# Patient Record
Sex: Female | Born: 1955 | Race: White | Hispanic: No | Smoking: Former smoker
Health system: Southern US, Community
[De-identification: ages and names within clinical notes are randomized; demographics above are authoritative.]

## PROBLEM LIST (undated history)

## (undated) DIAGNOSIS — J189 Pneumonia, unspecified organism: Secondary | ICD-10-CM

## (undated) DIAGNOSIS — T8859XA Other complications of anesthesia, initial encounter: Secondary | ICD-10-CM

## (undated) DIAGNOSIS — R112 Nausea with vomiting, unspecified: Secondary | ICD-10-CM

## (undated) DIAGNOSIS — I10 Essential (primary) hypertension: Secondary | ICD-10-CM

## (undated) DIAGNOSIS — F329 Major depressive disorder, single episode, unspecified: Secondary | ICD-10-CM

## (undated) DIAGNOSIS — F419 Anxiety disorder, unspecified: Secondary | ICD-10-CM

## (undated) DIAGNOSIS — F32A Depression, unspecified: Secondary | ICD-10-CM

## (undated) DIAGNOSIS — K219 Gastro-esophageal reflux disease without esophagitis: Secondary | ICD-10-CM

## (undated) DIAGNOSIS — Z86010 Personal history of colonic polyps: Secondary | ICD-10-CM

## (undated) DIAGNOSIS — IMO0001 Reserved for inherently not codable concepts without codable children: Secondary | ICD-10-CM

## (undated) DIAGNOSIS — Z9889 Other specified postprocedural states: Secondary | ICD-10-CM

## (undated) DIAGNOSIS — Z9289 Personal history of other medical treatment: Secondary | ICD-10-CM

## (undated) DIAGNOSIS — T4145XA Adverse effect of unspecified anesthetic, initial encounter: Secondary | ICD-10-CM

## (undated) DIAGNOSIS — K760 Fatty (change of) liver, not elsewhere classified: Secondary | ICD-10-CM

## (undated) HISTORY — DX: Anxiety disorder, unspecified: F41.9

## (undated) HISTORY — DX: Fatty (change of) liver, not elsewhere classified: K76.0

## (undated) HISTORY — DX: Essential (primary) hypertension: I10

## (undated) HISTORY — PX: PILONIDAL CYST EXCISION: SHX744

## (undated) HISTORY — DX: Major depressive disorder, single episode, unspecified: F32.9

## (undated) HISTORY — PX: TONSILLECTOMY: SUR1361

## (undated) HISTORY — DX: Gastro-esophageal reflux disease without esophagitis: K21.9

## (undated) HISTORY — DX: Personal history of colonic polyps: Z86.010

---

## 1997-01-12 HISTORY — PX: APPENDECTOMY: SHX54

## 2000-11-02 ENCOUNTER — Emergency Department (HOSPITAL_COMMUNITY): Admission: EM | Admit: 2000-11-02 | Discharge: 2000-11-02 | Payer: Self-pay

## 2003-12-23 ENCOUNTER — Ambulatory Visit: Payer: Self-pay | Admitting: Internal Medicine

## 2004-05-05 ENCOUNTER — Ambulatory Visit: Payer: Self-pay | Admitting: Family Medicine

## 2004-05-12 ENCOUNTER — Ambulatory Visit: Payer: Self-pay | Admitting: Internal Medicine

## 2004-06-08 ENCOUNTER — Ambulatory Visit: Payer: Self-pay | Admitting: Family Medicine

## 2004-06-09 ENCOUNTER — Ambulatory Visit: Payer: Self-pay | Admitting: Internal Medicine

## 2004-06-21 ENCOUNTER — Ambulatory Visit: Payer: Self-pay | Admitting: Family Medicine

## 2004-06-22 ENCOUNTER — Other Ambulatory Visit: Admission: RE | Admit: 2004-06-22 | Discharge: 2004-06-22 | Payer: Self-pay | Admitting: Obstetrics and Gynecology

## 2005-02-22 ENCOUNTER — Ambulatory Visit: Payer: Self-pay | Admitting: Family Medicine

## 2006-04-03 ENCOUNTER — Ambulatory Visit: Payer: Self-pay | Admitting: Internal Medicine

## 2006-06-26 ENCOUNTER — Ambulatory Visit: Payer: Self-pay | Admitting: Internal Medicine

## 2006-07-04 ENCOUNTER — Ambulatory Visit: Payer: Self-pay | Admitting: Internal Medicine

## 2006-07-14 LAB — CONVERTED CEMR LAB: Pap Smear: NORMAL

## 2006-07-24 LAB — CONVERTED CEMR LAB
ALT: 20 units/L (ref 0–40)
Albumin: 4 g/dL (ref 3.5–5.2)
Alkaline Phosphatase: 43 units/L (ref 39–117)
BUN: 13 mg/dL (ref 6–23)
Basophils Absolute: 0.1 10*3/uL (ref 0.0–0.1)
Calcium: 9.1 mg/dL (ref 8.4–10.5)
Eosinophils Relative: 1.9 % (ref 0.0–5.0)
GFR calc Af Amer: 98 mL/min
GFR calc non Af Amer: 81 mL/min
HCT: 42.2 % (ref 36.0–46.0)
Hemoglobin: 14.9 g/dL (ref 12.0–15.0)
LDL Cholesterol: 136 mg/dL — ABNORMAL HIGH (ref 0–99)
Monocytes Absolute: 0.6 10*3/uL (ref 0.2–0.7)
Neutrophils Relative %: 54.9 % (ref 43.0–77.0)
Potassium: 4.5 meq/L (ref 3.5–5.1)
RBC: 4.75 M/uL (ref 3.87–5.11)
RDW: 12 % (ref 11.5–14.6)
Total CHOL/HDL Ratio: 4.5
WBC: 6.5 10*3/uL (ref 4.5–10.5)

## 2006-09-06 ENCOUNTER — Ambulatory Visit (HOSPITAL_COMMUNITY): Admission: RE | Admit: 2006-09-06 | Discharge: 2006-09-06 | Payer: Self-pay | Admitting: Obstetrics and Gynecology

## 2006-11-14 ENCOUNTER — Telehealth (INDEPENDENT_AMBULATORY_CARE_PROVIDER_SITE_OTHER): Payer: Self-pay | Admitting: *Deleted

## 2006-11-14 DIAGNOSIS — M25569 Pain in unspecified knee: Secondary | ICD-10-CM

## 2006-11-14 DIAGNOSIS — M25559 Pain in unspecified hip: Secondary | ICD-10-CM

## 2007-01-15 ENCOUNTER — Encounter: Payer: Self-pay | Admitting: Internal Medicine

## 2007-06-09 ENCOUNTER — Ambulatory Visit: Payer: Self-pay | Admitting: Internal Medicine

## 2007-06-09 DIAGNOSIS — J45909 Unspecified asthma, uncomplicated: Secondary | ICD-10-CM

## 2007-07-14 ENCOUNTER — Ambulatory Visit: Payer: Self-pay | Admitting: Internal Medicine

## 2007-07-14 HISTORY — PX: BREAST BIOPSY: SHX20

## 2007-07-28 ENCOUNTER — Encounter: Admission: RE | Admit: 2007-07-28 | Discharge: 2007-07-28 | Payer: Self-pay | Admitting: Obstetrics and Gynecology

## 2007-08-20 ENCOUNTER — Ambulatory Visit (HOSPITAL_COMMUNITY): Admission: RE | Admit: 2007-08-20 | Discharge: 2007-08-20 | Payer: Self-pay | Admitting: General Surgery

## 2007-08-20 ENCOUNTER — Encounter (HOSPITAL_BASED_OUTPATIENT_CLINIC_OR_DEPARTMENT_OTHER): Payer: Self-pay | Admitting: General Surgery

## 2007-08-20 ENCOUNTER — Encounter: Admission: RE | Admit: 2007-08-20 | Discharge: 2007-08-20 | Payer: Self-pay | Admitting: General Surgery

## 2007-11-23 DIAGNOSIS — Z860100 Personal history of colon polyps, unspecified: Secondary | ICD-10-CM

## 2007-11-23 HISTORY — DX: Personal history of colon polyps, unspecified: Z86.0100

## 2007-11-23 HISTORY — DX: Personal history of colonic polyps: Z86.010

## 2008-04-12 ENCOUNTER — Ambulatory Visit: Payer: Self-pay | Admitting: Internal Medicine

## 2008-04-13 ENCOUNTER — Encounter (INDEPENDENT_AMBULATORY_CARE_PROVIDER_SITE_OTHER): Payer: Self-pay | Admitting: *Deleted

## 2008-04-13 ENCOUNTER — Telehealth (INDEPENDENT_AMBULATORY_CARE_PROVIDER_SITE_OTHER): Payer: Self-pay | Admitting: *Deleted

## 2008-09-08 ENCOUNTER — Encounter (INDEPENDENT_AMBULATORY_CARE_PROVIDER_SITE_OTHER): Payer: Self-pay | Admitting: *Deleted

## 2008-11-11 ENCOUNTER — Encounter: Admission: RE | Admit: 2008-11-11 | Discharge: 2008-11-11 | Payer: Self-pay | Admitting: Internal Medicine

## 2008-11-24 ENCOUNTER — Encounter: Payer: Self-pay | Admitting: Internal Medicine

## 2008-11-24 LAB — CONVERTED CEMR LAB
Alkaline Phosphatase: 56 units/L
BUN: 11 mg/dL
Blood Glucose, Fasting: 90 mg/dL
CO2, serum: 24 mmol/L
Calcium: 8.8 mg/dL
Chloride, Serum: 100 mmol/L
Cholesterol: 196 mg/dL
Hemoglobin: 13.6 g/dL
Hgb A1c MFr Bld: 5.9 %
LDL Cholesterol: 122 mg/dL
MCH: 28.9 pg
MCV: 90 fL
RBC count: 4.71 10*6/uL
Sodium, serum: 136 mmol/L
Total Bilirubin: 0.4 mg/dL
Total Protein: 6.4 g/dL
WBC, blood: 7.9 10*3/uL

## 2008-12-07 ENCOUNTER — Ambulatory Visit: Payer: Self-pay | Admitting: Internal Medicine

## 2008-12-07 DIAGNOSIS — R930 Abnormal findings on diagnostic imaging of skull and head, not elsewhere classified: Secondary | ICD-10-CM | POA: Insufficient documentation

## 2008-12-07 DIAGNOSIS — R7303 Prediabetes: Secondary | ICD-10-CM

## 2008-12-07 DIAGNOSIS — K219 Gastro-esophageal reflux disease without esophagitis: Secondary | ICD-10-CM | POA: Insufficient documentation

## 2008-12-08 ENCOUNTER — Encounter (INDEPENDENT_AMBULATORY_CARE_PROVIDER_SITE_OTHER): Payer: Self-pay | Admitting: *Deleted

## 2008-12-09 ENCOUNTER — Telehealth (INDEPENDENT_AMBULATORY_CARE_PROVIDER_SITE_OTHER): Payer: Self-pay | Admitting: *Deleted

## 2008-12-13 ENCOUNTER — Ambulatory Visit: Payer: Self-pay | Admitting: Diagnostic Radiology

## 2008-12-13 ENCOUNTER — Encounter: Payer: Self-pay | Admitting: Internal Medicine

## 2008-12-13 ENCOUNTER — Telehealth (INDEPENDENT_AMBULATORY_CARE_PROVIDER_SITE_OTHER): Payer: Self-pay | Admitting: *Deleted

## 2008-12-13 ENCOUNTER — Emergency Department (HOSPITAL_BASED_OUTPATIENT_CLINIC_OR_DEPARTMENT_OTHER): Admission: EM | Admit: 2008-12-13 | Discharge: 2008-12-13 | Payer: Self-pay | Admitting: Emergency Medicine

## 2008-12-20 ENCOUNTER — Telehealth: Payer: Self-pay | Admitting: Internal Medicine

## 2008-12-21 ENCOUNTER — Encounter: Payer: Self-pay | Admitting: Internal Medicine

## 2008-12-21 ENCOUNTER — Ambulatory Visit: Payer: Self-pay | Admitting: Internal Medicine

## 2008-12-23 ENCOUNTER — Encounter: Admission: RE | Admit: 2008-12-23 | Discharge: 2008-12-23 | Payer: Self-pay | Admitting: Internal Medicine

## 2009-01-04 ENCOUNTER — Encounter (INDEPENDENT_AMBULATORY_CARE_PROVIDER_SITE_OTHER): Payer: Self-pay | Admitting: *Deleted

## 2009-01-14 ENCOUNTER — Ambulatory Visit: Payer: Self-pay | Admitting: Internal Medicine

## 2009-01-18 ENCOUNTER — Encounter (INDEPENDENT_AMBULATORY_CARE_PROVIDER_SITE_OTHER): Payer: Self-pay | Admitting: *Deleted

## 2009-10-14 ENCOUNTER — Ambulatory Visit: Payer: Self-pay | Admitting: Internal Medicine

## 2009-11-04 ENCOUNTER — Telehealth (INDEPENDENT_AMBULATORY_CARE_PROVIDER_SITE_OTHER): Payer: Self-pay | Admitting: *Deleted

## 2009-11-04 ENCOUNTER — Ambulatory Visit: Payer: Self-pay | Admitting: Family Medicine

## 2009-11-04 DIAGNOSIS — R1011 Right upper quadrant pain: Secondary | ICD-10-CM

## 2009-11-04 DIAGNOSIS — I10 Essential (primary) hypertension: Secondary | ICD-10-CM | POA: Insufficient documentation

## 2009-11-04 DIAGNOSIS — R079 Chest pain, unspecified: Secondary | ICD-10-CM

## 2009-11-07 ENCOUNTER — Ambulatory Visit: Payer: Self-pay | Admitting: Diagnostic Radiology

## 2009-11-07 ENCOUNTER — Encounter (INDEPENDENT_AMBULATORY_CARE_PROVIDER_SITE_OTHER): Payer: Self-pay | Admitting: *Deleted

## 2009-11-07 ENCOUNTER — Telehealth: Payer: Self-pay | Admitting: Family Medicine

## 2009-11-07 ENCOUNTER — Ambulatory Visit (HOSPITAL_BASED_OUTPATIENT_CLINIC_OR_DEPARTMENT_OTHER): Admission: RE | Admit: 2009-11-07 | Discharge: 2009-11-07 | Payer: Self-pay | Admitting: Family Medicine

## 2009-11-15 ENCOUNTER — Encounter (INDEPENDENT_AMBULATORY_CARE_PROVIDER_SITE_OTHER): Payer: Self-pay | Admitting: *Deleted

## 2009-11-15 ENCOUNTER — Ambulatory Visit: Payer: Self-pay | Admitting: Internal Medicine

## 2009-11-15 DIAGNOSIS — R1319 Other dysphagia: Secondary | ICD-10-CM

## 2009-11-15 DIAGNOSIS — E669 Obesity, unspecified: Secondary | ICD-10-CM

## 2009-11-22 ENCOUNTER — Ambulatory Visit: Payer: Self-pay | Admitting: Internal Medicine

## 2009-11-22 LAB — HM COLONOSCOPY

## 2009-11-28 ENCOUNTER — Encounter: Payer: Self-pay | Admitting: Internal Medicine

## 2009-12-05 ENCOUNTER — Ambulatory Visit: Payer: Self-pay | Admitting: Internal Medicine

## 2009-12-07 ENCOUNTER — Encounter
Admission: RE | Admit: 2009-12-07 | Discharge: 2009-12-07 | Payer: Self-pay | Source: Home / Self Care | Admitting: Internal Medicine

## 2009-12-07 LAB — CONVERTED CEMR LAB
ALT: 17 units/L (ref 0–35)
BUN: 12 mg/dL (ref 6–23)
Basophils Absolute: 0 10*3/uL (ref 0.0–0.1)
CO2: 24 meq/L (ref 19–32)
Chloride: 99 meq/L (ref 96–112)
Cholesterol: 148 mg/dL (ref 0–200)
Creatinine, Ser: 0.7 mg/dL (ref 0.4–1.2)
Glucose, Bld: 86 mg/dL (ref 70–99)
HCT: 39.8 % (ref 36.0–46.0)
Hgb A1c MFr Bld: 5.7 % (ref 4.6–6.5)
LDL Cholesterol: 91 mg/dL (ref 0–99)
Lymphocytes Relative: 25.6 % (ref 12.0–46.0)
Monocytes Relative: 7.8 % (ref 3.0–12.0)
Neutro Abs: 5.2 10*3/uL (ref 1.4–7.7)
Platelets: 271 10*3/uL (ref 150.0–400.0)
Potassium: 4.1 meq/L (ref 3.5–5.1)
RDW: 13 % (ref 11.5–14.6)
Triglycerides: 86 mg/dL (ref 0.0–149.0)

## 2009-12-07 LAB — HM MAMMOGRAPHY: HM Mammogram: NORMAL

## 2009-12-26 ENCOUNTER — Telehealth (INDEPENDENT_AMBULATORY_CARE_PROVIDER_SITE_OTHER): Payer: Self-pay | Admitting: *Deleted

## 2010-01-23 ENCOUNTER — Encounter: Payer: Self-pay | Admitting: Internal Medicine

## 2010-03-12 LAB — CONVERTED CEMR LAB
Amylase: 33 units/L
Troponin I: <0.01 ng/mL (ref <0.06)

## 2010-03-16 NOTE — Assessment & Plan Note (Signed)
Summary: cough/cbs   Vital Signs:  Patient profile:   55 year old female Height:      64 inches Weight:      202.38 pounds BMI:     34.86 O2 Sat:      99 % on Room air Temp:     99.4 degrees F oral Pulse rate:   98 / minute Pulse rhythm:   regular BP sitting:   128 / 82  (left arm) Cuff size:   large  Vitals Entered By: Army Fossa CMA (October 14, 2009 1:17 PM)  O2 Flow:  Room air CC: Head/chest cold Comments Had a head cold a few days ago. Now moved to her chest, having problems with the coughing and breathing. Using Mucinex.  Was started on BP meds-- willl call with the name. unsure.    History of Present Illness: developed a cold few days ago Now is "going to the chest " complaint of wheezing and cough  Current Medications (verified): 1)  Symbicort 160-4.5 Mcg/act Aero (Budesonide-Formoterol Fumarate) .... Inhale 2 Puff Using Inhaler Twice A Day 2)  Nexium 40 Mg Cpdr (Esomeprazole Magnesium) .Marland Kitchen.. 1 By Mouth Two Times A Day 3)  Albuterol 90 Mcg/act Aers (Albuterol) .... Inhale 2 Puff Using Inhaler Every Four Hours  Allergies (verified): No Known Drug Allergies  Past History:  Past Medical History: Reviewed history from 06/09/2007 and no changes required. Asthma  Past Surgical History: Reviewed history from 12/07/2008 and no changes required. rt left breast bx (neg) 07/2007 Appendectomy (01/1997) Caesarean section (03/1996) Tonsillectomy plantar fascitis (rt foot) pilonidal cyst removed  Social History: Reviewed history from 12/07/2008 and no changes required. works in Clinical biochemist Alcohol use-yes (2-3 x /week) Former Smoker (quit 1998) Single 1 son Regular exercise--- no  Review of Systems       having a low-grade fever Some postnasal dripping and sore throat. The sore throat is better She is coughing greenish color sputum. other than this problem, her asthma has been well controlled, as he uses it regularly. hardly ever uses albuterol  except for the last couple of days  Physical Exam  General:  alert and well-developed.   Head:  face is symmetric, not tender Ears:  R ear normal and L ear normal.   Nose:  slightly congested Mouth:  no redness or discharge Lungs:  normal respiratory effort, no intercostal retractions, no accessory muscle use, and few end expiratory wheezing. No increased work of breathing. No rales or crackles Heart:  Normal rate and regular rhythm. S1 and S2 normal without gallop, murmur, click, rub or other extra sounds. Extremities:  no lower extremity edema   Impression & Recommendations:  Problem # 1:  ASTHMA (ICD-493.90) well-controlled asthma except for the last few days. Exacerbation likely due to a URI. See instructions Her updated medication list for this problem includes:    Symbicort 160-4.5 Mcg/act Aero (Budesonide-formoterol fumarate) ..... Inhale 2 puff using inhaler twice a day    Ventolin Hfa 108 (90 Base) Mcg/act Aers (Albuterol sulfate) .Marland Kitchen... 2 puffs every 6 hours as needed for cough or wheezing    Prednisone 20 Mg Tabs (Prednisone) ..... One by mouth daily for 5 days  Complete Medication List: 1)  Symbicort 160-4.5 Mcg/act Aero (Budesonide-formoterol fumarate) .... Inhale 2 puff using inhaler twice a day 2)  Nexium 40 Mg Cpdr (Esomeprazole magnesium) .Marland Kitchen.. 1 by mouth two times a day 3)  Ventolin Hfa 108 (90 Base) Mcg/act Aers (Albuterol sulfate) .... 2 puffs every 6  hours as needed for cough or wheezing 4)  Zithromax Z-pak 250 Mg Tabs (Azithromycin) .... As directed 5)  Prednisone 20 Mg Tabs (Prednisone) .... One by mouth daily for 5 days  Patient Instructions: 1)  rest, fluids, Tylenol as needed for fever. 2)  Robitussin-DM or Mucinex DM as needed for cough 3)  Continue with Symbicort 4)  Use albuterol as needed for wheezing and cough 5)  Start Zithromax, an  antibiotic 6)  If you get slightly worse and more chest congested, start prednisone 7)  If you get much worse,  thenn  call us  or go to a urgent care Prescriptions: PREDNISONE 20 MG TABS (PREDNISONE) one by mouth daily for 5 days  #5 x 0   Entered and Authorized by:   Nolon Rod. Morgin Halls MD   Signed by:   Nolon Rod. Lainee Lehrman MD on 10/14/2009   Method used:   Print then Give to Patient   RxID:   [id number] ZITHROMAX Z-PAK 250 MG TABS (AZITHROMYCIN) as directed  #1 x 0   Entered and Authorized by:   Nolon Rod. Passion Lavin MD   Signed by:   Nolon Rod. Maricel Swartzendruber MD on 10/14/2009   Method used:   Print then Give to Patient   RxID:   [id number] VENTOLIN HFA 108 (90 BASE) MCG/ACT AERS (ALBUTEROL SULFATE) 2 puffs every 6 hours as needed for cough or wheezing  #1 x 2   Entered and Authorized by:   Nolon Rod. Gina Leblond MD   Signed by:   Nolon Rod. Paulena Servais MD on 10/14/2009   Method used:   Print then Give to Patient   RxID:   [id number]

## 2010-03-16 NOTE — Assessment & Plan Note (Signed)
Summary: dysphapia--ch.    History of Present Illness Visit Type: Initial Consult Primary GI MD: Stan Head MD [facility] Primary Shanese Riemenschneider: Willow Ora, MD Requesting Tamieka Rancourt: Loreen Freud, MD Chief Complaint: gerd History of Present Illness:   Patient referred for epigastric pain which has increased in the last couple of weeks. She does not feel like her solid food is going all the way through her esphagus. She feels like she is having some esophgeal spasms. She has had some significant chest pains which prompted the visit to Dr. Laury Axon recently.  Increased throat clearing. Food seems to sit in suprasternal area and drinking water helps reduce throat clearing and acidic tast. She had been on Nexium x years and was without symptoms until about 1 month ago. Dr. Laury Axon gave her Dexilant samples and she modified to a bland diet, no caffeine, alcohol, or citrus. Was getting 2-4 caffeine drinks/day.  she is significantly better compared to last week without constant chest pain.   GI Review of Systems    Reports acid reflux, chest pain, and  loss of appetite.      Denies abdominal pain, belching, bloating, dysphagia with liquids, dysphagia with solids, heartburn, nausea, vomiting, vomiting blood, weight loss, and  weight gain.        Denies anal fissure, black tarry stools, change in bowel habit, constipation, diarrhea, diverticulosis, fecal incontinence, heme positive stool, hemorrhoids, irritable bowel syndrome, jaundice, light color stool, liver problems, rectal bleeding, and  rectal pain. Preventive Screening-Counseling & Management  Alcohol-Tobacco     Smoking Status: quit > 6 months  Caffeine-Diet-Exercise     Caffeine use/day: 4     Caffeine Counseling: decrease use of caffeine     Diet Counseling: to improve diet; diet is suboptimal    Current Medications (verified): 1)  Symbicort 160-4.5 Mcg/act Aero (Budesonide-Formoterol Fumarate) .... Inhale 2 Puff Using Inhaler Twice A Day 2)   Dexilant 60 Mg Cpdr (Dexlansoprazole) .... Take One By Mouth Once Daily 3)  Ventolin Hfa 108 (90 Base) Mcg/act Aers (Albuterol Sulfate) .... 2 Puffs Every 6 Hours As Needed For Cough or Wheezing 4)  Prinzide 10-12.5 Mg Tabs (Lisinopril-Hydrochlorothiazide) .Marland Kitchen.. 1 By Mouth Once Daily  Allergies (verified): No Known Drug Allergies  Past History:  Past Medical History: Asthma GERD Hypertension Obesity  Past Surgical History: rt breast bx (neg) 07/2007 Appendectomy (01/1997) Caesarean section (03/1996) Tonsillectomy plantar fascitis (rt foot) pilonidal cyst removed  Family History: Reviewed history from 12/07/2008 and no changes required. 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         All other ROS negative except as per HPI.   Vital Signs:  Patient profile:   55 year old female Height:      64 inches Weight:      200.6 pounds BMI:     34.56 Pulse rate:   78 / minute Pulse rhythm:   regular BP sitting:   132 / 64  (left arm) Cuff size:   regular  Vitals Entered By: Harlow Mares CMA Duncan Dull) (November 15, 2009 2:03 PM)  Physical Exam  General:  obese.  NAD Eyes:  PERRLA, no icterus. Mouth:  No deformity or lesions, dentition normal. Neck:  Supple; no masses or thyromegaly. Lungs:  Clear throughout to auscultation. Heart:  Regular rate and rhythm; no murmurs, rubs,  or bruits. Abdomen:  obese, soft and nontender w/o HSM, mass Rectal:  deferred until time of colonoscopy.   Extremities:  no lower extremity edema Neurologic:  Alert and  oriented x3 Cervical Nodes:  No significant cervical or  supraclavicular adenopathy.  Psych:  Alert and cooperative. Normal mood and affect.   Impression & Recommendations:  Problem # 1:  GERD (ICD-530.81) Assessment New Has some proximal and classic features recent worsening could have beeb due to tachyphylaxis to Nexium Continue PPI - try pantoprazole which is on formulary (40 mg once daily) weight loss and caffeine reduction discussed  Problem # 2:  OTHER DYSPHAGIA (ZOX-096.04) Assessment: New Mild dysphagia described. Likely from GERD. EGD and possible dilation  Problem # 3:  SCREENING, COLON CANCER (ICD-V76.51) Assessment: New Risks, benefits,and indications of endoscopic procedure(s) were reviewed with the patient and all questions answered.  Problem # 4:  OBESITY (ICD-278.00) Assessment: Comment Only she understands it is contrbuting to health problems and why she should lose weight says she can exercise and reduce portions and lose encouraged to do so  Patient Instructions: 1)  You need to lose weight. Start by limiting portions, amounts. Avoid eating when not hungry. Limit desserts.Look for high fructose corn syrup on food labels and if in first 3 ingredients, avoid that food. Also try to eat whole grains, avoid "white foods" (e.g. white rice, white bread).   2)  Avoid foods high in acid content ( tomatoes, citrus juices, spicy foods) . Avoid eating within 3 to 4 hours of lying down or before exercising. Do not over eat; try smaller more frequent meals. Elevate head of bed four inches when sleeping.  3)  Pantoprazole 40 mg daily has been prescribed, this replaces Dexilant which is not on your formulary. 4)  Please pick up your medications at your pharmacy. 5)  Copy sent to : Willow Ora, MD 6)  The medication list was reviewed and reconciled.  All changed / newly prescribed medications were explained.  A complete medication list was provided to the patient / caregiver. Prescriptions: MOVIPREP 100 GM  SOLR (PEG-KCL-NACL-NASULF-NA  ASC-C) As per prep instructions.  #1 x 0   Entered by:   Francee Piccolo CMA (AAMA)   Authorized by:   Iva Boop MD, [facility]   Signed by:   Francee Piccolo CMA (AAMA) on 11/15/2009   Method used:   Electronically to        [facility] [phone]* (retail)       [address]       [place], [place]  [address]       Ph: [phone]       Fax: [phone]   RxID:   [id number] PANTOPRAZOLE SODIUM 40 MG TBEC (PANTOPRAZOLE SODIUM) 1 by mouth once daily take 30-60 minutes before breakfast  #30 x 11   Entered and Authorized by:   Iva Boop MD, [facility]   Signed by:   Iva Boop MD, [facility] on 11/15/2009   Method used:   Electronically to        [facility] [address] [id number]* (retail)       [address]  [place], [place]  [address]       Ph: [phone]       Fax: [phone]   RxID:   [id number]   Appended Document: Orders Update

## 2010-03-16 NOTE — Procedures (Signed)
Summary: Upper Endoscopy  Patient: Vanessa Middleton Note: All result statuses are Final unless otherwise noted.  Tests: (1) Upper Endoscopy (EGD)   EGD Upper Endoscopy       DONE     [facility]     [address]     [place], [place]  [address]           ENDOSCOPY PROCEDURE REPORT           PATIENT:  Temple, [name]  MR#:  [id number]     BIRTHDATE:  [birth year], 54 yrs. old  GENDER:  female           ENDOSCOPIST:  Iva Boop, MD, [facility]     Referred by:  Willow Ora, M.D.           PROCEDURE DATE:  11/22/2009     PROCEDURE:  EGD, diagnostic, Maloney Dilation of Esophagus     ASA CLASS:  Class II     INDICATIONS:  dysphagia, reflux symptoms despite therapy           MEDICATIONS:   Fentanyl 50 mcg IV, Versed 4 mg IV     TOPICAL ANESTHETIC:  Exactacain Spray           DESCRIPTION OF PROCEDURE:   After the risks benefits and     alternatives of the procedure were thoroughly explained, informed     consent was obtained.  The [facility] [id number] [id number] endoscope was     introduced through the mouth and advanced to the second portion of     the duodenum, without limitations.  The instrument was slowly     withdrawn as the mucosa was fully examined.     <<PROCEDUREIMAGES>>           The upper, middle, and distal third of the esophagus were     carefully inspected and no abnormalities were noted. The z-line     was well seen at the GEJ. The endoscope was pushed into the fundus     which was normal including a retroflexed view. The antrum,gastric     body, first and second part of the duodenum were unremarkable.     Z-line at 40 cm. Dilation with maloney dilator    Retroflexed     views revealed no abnormalities.    The scope was then withdrawn     from the patient and the procedure completed.           COMPLICATIONS:  None           ENDOSCOPIC IMPRESSION:     1) Normal EGD     54 French Maloney Dilation performed due to dysphagia     RECOMMENDATIONS:     Continue  pantoprazole.     Clear liquids until 10 AM then soft foods today.     Normal foods tomorrow. Schedule a follow-up with Dr. Leone Payor if     swallowing problems and reflux difficulties persist over time     (wait 1 month or more to see if there is improvement and     resolution).     If doing well please obtain pantoprazole prescriptions and     follow-up from Dr. Drue Novel.           REPEAT EXAM:  In for as needed.           Iva Boop, MD, [facility]           CC:  Willow Ora, MD  The Patient           n.     eSIGNED:   Iva Boop at 11/22/2009 09:22 AM           Roma Kayser, [id number]  Note: An exclamation mark (!) indicates a result that was not dispersed into the flowsheet. Document Creation Date: 11/22/2009 9:23 AM _______________________________________________________________________  (1) Order result status: Final Collection or observation date-time: 11/22/2009 08:42 Requested date-time:  Receipt date-time:  Reported date-time:  Referring Physician:   Ordering Physician: Stan Head [phone]) Specimen Source:  Source: Launa Grill Order Number: [id number] Lab site:   Appended Document: Upper Endoscopy   EGD  Procedure date:  11/22/2009  Findings:          1) Normal EGD     54 French Maloney Dilation performed due to dysphagia     RECOMMENDATIONS:     Continue pantoprazole.     Clear liquids until 10 AM then soft foods today.     Normal foods tomorrow. Schedule a follow-up with Dr. Leone Payor if     swallowing problems and reflux difficulties persist over time     (wait 1 month or more to see if there is improvement and     resolution).     If doing well please obtain pantoprazole prescriptions and     follow-up from Dr. Drue Novel.

## 2010-03-16 NOTE — Procedures (Signed)
Summary: Colonoscopy  Patient: Vanessa Middleton Note: All result statuses are Final unless otherwise noted.  Tests: (1) Colonoscopy (COL)   COL Colonoscopy           DONE     [facility]     [address]     [place], [place]  [address]           COLONOSCOPY PROCEDURE REPORT           PATIENT:  [name], [name]  MR#:  [id number]     BIRTHDATE:  February 07, 1956, 54 yrs. old  GENDER:  female     ENDOSCOPIST:  Iva Boop, MD, [facility]           PROCEDURE DATE:  11/22/2009     PROCEDURE:  Colonoscopy with snare polypectomy     ASA CLASS:  Class II     INDICATIONS:  Routine Risk Screening     MEDICATIONS:   Fentanyl 25 mcg IV, Versed 4 mg IV, There was     residual sedation effect present from prior procedure.           DESCRIPTION OF PROCEDURE:   After the risks benefits and     alternatives of the procedure were thoroughly explained, informed     consent was obtained.  Digital rectal exam was performed and     revealed no abnormalities.   The LB 180AL [id number] endoscope was     introduced through the anus and advanced to the cecum, which was     identified by both the appendix and ileocecal valve, without     limitations.  The quality of the prep was good, using MoviPrep.     The instrument was then slowly withdrawn as the colon was fully     examined. Insertion: 5:28 minutes Withdrawal: 17:14 minutes     <<PROCEDUREIMAGES>>           FINDINGS:  Three polyps were found in the distal transverse colon.     They were diminutive. Polyps were snared without cautery.     Retrieval was successful. snare polyp  This was otherwise a normal     examination of the colon.   Retroflexed views in the rectum     revealed external hemorrhoids.    The scope was then withdrawn     from the patient and the procedure completed.           COMPLICATIONS:  None     ENDOSCOPIC IMPRESSION:     1) Three diminutive  polyps removed from the distal transverse     colon     2) External hemorrhoids    3) Otherwise normal examination, good prep           REPEAT EXAM:  In for Colonoscopy, pending biopsy results.           Iva Boop, MD, [facility]           CC:  Willow Ora, MD     The Patient           n.     eSIGNED:   Iva Boop at 11/22/2009 09:27 AM           Roma Kayser, [id number]  Note: An exclamation mark (!) indicates a result that was not dispersed into the flowsheet. Document Creation Date: 11/22/2009 9:29 AM _______________________________________________________________________  (1) Order result status: Final Collection or observation date-time: 11/22/2009 09:11 Requested date-time:  Receipt date-time:  Reported date-time:  Referring  Physician:   Ordering Physician: Stan Head [phone]) Specimen Source:  Source: Launa Grill Order Number: [id number] Lab site:   Appended Document: Colonoscopy   Colonoscopy  Procedure date:  11/22/2009  Findings:          1) Three diminutive  polyps removed from the distal transverse     colon ADENOMAS     2) External hemorrhoids     3) Otherwise normal examination, good prep  Comments:      Repeat colonoscopy in 3 years.     Procedures Next Due Date:    Colonoscopy: 11/2012   Appended Document: Colonoscopy     Procedures Next Due Date:    Colonoscopy: 11/2012

## 2010-03-16 NOTE — Progress Notes (Signed)
Summary: Questions about meds and procedure   Phone Note Call from Patient   Caller: Patient Details for Reason: Questions about meds/procedure Summary of Call: Mssg from pt  stating pharmacy doies not have her RX yet for BP meds and has questions about procedure. c/b # [id number].   Almeta Monas CMA Duncan Dull)  November 07, 2009 3:44 PM    Left message to call back Almeta Monas CMA Duncan Dull)  November 07, 2009 3:44 PM   Follow-up for Phone Call        pt states that pharmacy did not receive rx sent in on 11-04-09 for PRINZIDE 10-12.5 MG TABS.spoke with pharmacy and verbally gave rx per 11-04-09 rx.................Marland KitchenFelecia Deloach CMA  November 07, 2009 5:05 PM

## 2010-03-16 NOTE — Letter (Signed)
Summary: Patient Notice- Polyp Results  [facility]  [address]   [place], [place] [address]   Phone: [phone]  Fax: [phone]        November 28, 2009 MRN: [id number]    MANASI DISHON [address] [place], [place]  [address]    Dear Ms. Stann,  The polyps removed from your colon were adenomatous. This means that they were pre-cancerous or that  they had the potential to change into cancer over time.   I recommend that you have a repeat colonoscopy in 3 years to determine if you have developed any new polyps over time and to screen for colorectal colon cancer.. If you develop any new rectal bleeding, abdominal pain or significant bowel habit changes, please contact us before then.  In addition to repeating colonoscopy, changing health habits may reduce your risk of having more colon polyps and possibly, colon cancer. You may lower your risk of future polyps and colon cancer by adopting healthy habits such as not smoking or using tobacco (if you do), being physically active, losing weight (if overweight), and eating a diet which includes fruits and vegetables and limits red meat.  Please call us if you are having persistent problems or have questions about your condition that have not been fully answered at this time.   Sincerely,  Iva Boop MD, [facility]  This letter has been electronically signed by your physician.  Appended Document: Patient Notice- Polyp Results letter mailed

## 2010-03-16 NOTE — Letter (Signed)
Summary: New Patient letter  [facility]  [address] [place], [place] [address]   Phone: [phone]  Fax: [phone]       11/07/2009 MRN: [id number]  Vanessa Middleton [address] [place], [place]  [address]  Dear Ms. Kanzler,  Welcome to the Gastroenterology Division at [facility].    You are scheduled to see Dr.  Leone Payor on 11-15-09 at 2:00p.m. on the 3rd floor at [facility], [address]  We ask that you try to arrive at our office 15 minutes prior to your appointment time to allow for check-in.  We would like you to complete the enclosed self-administered evaluation form prior to your visit and bring it with you on the day of your appointment.  We will review it with you.  Also, please bring a complete list of all your medications or, if you prefer, bring the medication bottles and we will list them.  Please bring your insurance card so that we may make a copy of it.  If your insurance requires a referral to see a specialist, please bring your referral form from your primary care physician.  Co-payments are due at the time of your visit and may be paid by cash, check or credit card.     Your office visit will consist of a consult with your physician (includes a physical exam), any laboratory testing he/she may order, scheduling of any necessary diagnostic testing (e.g. x-ray, ultrasound, CT-scan), and scheduling of a procedure (e.g. Endoscopy, Colonoscopy) if required.  Please allow enough time on your schedule to allow for any/all of these possibilities.    If you cannot keep your appointment, please call [phone] to cancel or reschedule prior to your appointment date.  This allows [place] the opportunity to schedule an appointment for another patient in need of care.  If you do not cancel or reschedule by 5 p.m. the business day prior to your appointment date, you will be charged a $50.00 late cancellation/no-show fee.    Thank you for  choosing [facility] for your medical needs.  We appreciate the opportunity to care for you.  Please visit [place] at our website  to learn more about our practice.                     Sincerely,                                                             The Gastroenterology Division

## 2010-03-16 NOTE — Progress Notes (Signed)
Summary: pain   Phone Note Call from Patient   Summary of Call: patien is having pain chest & stomache - some pain in back - she is at a camp  - a nurse took vitals - said they were ok but she should see md today - patient seems to be in more pain than describing .Marland KitchenOkey Regal [place]  November 04, 2009 8:45 AM    Follow-up for Phone Call        spoke w/ patient noted some problems w/ reflux nurse at camp took vitals and bp was also elevated at 170/90 otherwise patient isn't experiencing and SOB, nausea, or numbness and tingling in arms informed patient that if she develps intense chest pain any nausea  and feeling worse in general instructed to go to emergency room patient agreed.Marland KitchenMarland KitchenMarland KitchenDoristine Devoid CMA  November 04, 2009 8:53 AM

## 2010-03-16 NOTE — Assessment & Plan Note (Signed)
Summary: CPX AND FASTING LABS--SHE HAS CHKED WITH INS--THEY WILL PAY//...   Vital Signs:  Patient profile:   55 year old female Height:      64 inches Weight:      196.25 pounds Pulse rate:   75 / minute Pulse rhythm:   regular BP sitting:   126 / 80  (left arm) Cuff size:   regular  Vitals Entered By: Army Fossa CMA (December 05, 2009 9:19 AM) CC: CPX, fasting  Comments wants to discuss BP meds- having a dry cough [facility] [address] due for mammo and pap had gyn   History of Present Illness: CPX had CP few weeks ago,  felt to be GERD per patient , symptoms resolved  Preventive Screening-Counseling & Management  Alcohol-Tobacco     Smoking Status: never  Current Medications (verified): 1)  Symbicort 160-4.5 Mcg/act Aero (Budesonide-Formoterol Fumarate) .... Inhale 2 Puff Using Inhaler Twice A Day 2)  Pantoprazole Sodium 40 Mg Tbec (Pantoprazole Sodium) .Marland Kitchen.. 1 By Mouth Once Daily Take 30-60 Minutes Before Breakfast 3)  Ventolin Hfa 108 (90 Base) Mcg/act Aers (Albuterol Sulfate) .... 2 Puffs Every 6 Hours As Needed For Cough or Wheezing 4)  Prinzide 10-12.5 Mg Tabs (Lisinopril-Hydrochlorothiazide) .Marland Kitchen.. 1 By Mouth Once Daily 5)  Moviprep 100 Gm  Solr (Peg-Kcl-Nacl-Nasulf-Na Asc-C) .... As Per Prep Instructions.  Allergies (verified): No Known Drug Allergies  Past History:  Past Medical History: Asthma GERD, EGD 10-11  Hypertension fatty liver per ultrasound 9-11  Past Surgical History: Reviewed history from 11/15/2009 and no changes required. rt breast bx (neg) 07/2007 Appendectomy (01/1997) Caesarean section (03/1996) Tonsillectomy plantar fascitis (rt foot) pilonidal cyst removed  Family History: Reviewed history from 12/07/2008 and no changes required. M - living F - deceased - melanoma CAD - no DM - cousin HTN - M stroke - no colon Ca - no breast Ca - no  Social History: works in Clinical biochemist Alcohol use-yes (2-3 x /week) Former Smoker  (quit 1998) Single 1 son Regular exercise--- trying to walk more diet-- has changed mostky d/t GERD , also trying to eat less fat    Smoking Status:  never  Review of Systems General:  Denies fatigue and fever. CV:  Denies chest pain or discomfort and swelling of feet. Resp:  Denies sputum productive and wheezing; persistent cough since she started ACE inhibitors. GI:  Denies bloody stools, nausea, and vomiting; GERD much improved . GU:  Denies dysuria and hematuria. Psych:  Denies anxiety and depression; normal stress .  Physical Exam  General:  alert and well-developed.   Neck:  no masses and no thyromegaly.   Lungs:  normal respiratory effort, no intercostal retractions, no accessory muscle use, and normal breath sounds.   Heart:  normal rate, regular rhythm, no murmur, and no gallop.   Abdomen:  soft, non-tender, no distention, no masses, no guarding, and no rigidity.   Extremities:  no pretibial edema bilaterally  Psych:  Oriented X3, memory intact for recent and remote, normally interactive, good eye contact, not anxious appearing, and not depressed appearing.     Impression & Recommendations:  Problem # 1:  HEALTH SCREENING (ICD-V70.0) Td 2002 had a flu shot  Colonoscopy 11-2009, 3 adenomatous polyps, next in 3 years Bone density ----12-1998 and negative  due for gyn check up Dr Pennie Rushing  Pap smear per gyn   Mammogram 10-1998 and negative, encouraged to get them yearly   labs .  diet and exercise discussed    Orders:  Venipuncture (35573) TLB-Lipid Panel (80061-LIPID) TLB-BMP (Basic Metabolic Panel-BMET) (80048-METABOL) TLB-CBC Platelet - w/Differential (85025-CBCD) TLB-ALT (SGPT) (84460-ALT) TLB-AST (SGOT) (84450-SGOT) Specimen Handling (22025)  Problem # 2:  ESSENTIAL HYPERTENSION (ICD-401.9) was started on ACE inhibitors lately, since then she is having persistent dry cough. On chart review, her BP has been mostly well controlled Plan: Go back to only  diuretics, monitor BP.  see instructions  The following medications were removed from the medication list:    Prinzide 10-12.5 Mg Tabs (Lisinopril-hydrochlorothiazide) .Marland Kitchen... 1 by mouth once daily Her updated medication list for this problem includes:    Hydrochlorothiazide 25 Mg Tabs (Hydrochlorothiazide) ..... One by mouth daily  Problem # 3:  GERD (ICD-530.81) improving, continue with same meds. Her updated medication list for this problem includes:    Pantoprazole Sodium 40 Mg Tbec (Pantoprazole sodium) .Marland Kitchen... 1 by mouth once daily take 30-60 minutes before breakfast  Problem # 4:  CHEST PAIN UNSPECIFIED (ICD-786.50) resolved  Problem # 5:  HYPERGLYCEMIA, BORDERLINE (ICD-790.29) labs    Labs Reviewed: Creat: 0.79 (11/24/2008)     Orders: TLB-A1C / Hgb A1C (Glycohemoglobin) (83036-A1C)  Complete Medication List: 1)  Symbicort 160-4.5 Mcg/act Aero (Budesonide-formoterol fumarate) .... Inhale 2 puff using inhaler twice a day 2)  Pantoprazole Sodium 40 Mg Tbec (Pantoprazole sodium) .Marland Kitchen.. 1 by mouth once daily take 30-60 minutes before breakfast 3)  Ventolin Hfa 108 (90 Base) Mcg/act Aers (Albuterol sulfate) .... 2 puffs every 6 hours as needed for cough or wheezing 4)  Hydrochlorothiazide 25 Mg Tabs (Hydrochlorothiazide) .... One by mouth daily  Patient Instructions: 1)  go back to hydrochlorothiazide only 2)  Check your blood pressure 2 or 3 times a week. If it is more than 140/85 consistently,please let us know  3)  Low salt diet 4)  Please schedule a follow-up appointment in 6 months .  Prescriptions: HYDROCHLOROTHIAZIDE 25 MG TABS (HYDROCHLOROTHIAZIDE) one by mouth daily  #90 x 2   Entered and Authorized by:   Nolon Rod. Paz MD   Signed by:   Nolon Rod. Paz MD on 12/05/2009   Method used:   Electronically to        [id number] (retail)       [address]       [place], [place]  [address]       Ph: [phone]       Fax:  [phone]   RxID:   [id number]    Orders Added: 1)  Venipuncture [29937] 2)  TLB-Lipid Panel [80061-LIPID] 3)  TLB-BMP (Basic Metabolic Panel-BMET) [80048-METABOL] 4)  TLB-CBC Platelet - w/Differential [85025-CBCD] 5)  TLB-ALT (SGPT) [84460-ALT] 6)  TLB-AST (SGOT) [84450-SGOT] 7)  TLB-A1C / Hgb A1C (Glycohemoglobin) [83036-A1C] 8)  Specimen Handling [99000] 9)  Est. Patient age 6-64 [99396]   Immunization History:  Influenza Immunization History:    Influenza:  historical (11/30/2009)   Immunization History:  Influenza Immunization History:    Influenza:  Historical (11/30/2009)   Risk Factors:  Tobacco use:  never Alcohol use:  no

## 2010-03-16 NOTE — Progress Notes (Signed)
Summary: LABS  Phone Note Call from Patient   Caller: Patient Summary of Call: PT IS REQUESTING LABS BE FAXED TO HER FAX # [phone] PHONE # [phone]. LABS HAVE BEEN FAXED Initial call taken by: Lavell Islam,  December 26, 2009 10:46 AM

## 2010-03-16 NOTE — Letter (Signed)
Summary: [facility] Instructions  [facility]  [address] [place], [place] [address]   Phone: [phone]  Fax: [phone]       VICTOIRE DEANS    26-Sep-1955    MRN: [id number]      Procedure Day Dorna Bloom: Jake Shark, 11/22/09     Arrival Time: 7:30 AM      Procedure Time: 8:30 AM    Location of Procedure:                    _X_  [facility] (4th Floor)  PREPARATION FOR COLONOSCOPY WITH MOVIPREP   Starting 5 days prior to your procedure 11/17/09 do not eat nuts, seeds, popcorn, corn, beans, peas,  salads, or any raw vegetables.  Do not take any fiber supplements (e.g. Metamucil, Citrucel, and Benefiber).  THE DAY BEFORE YOUR PROCEDURE         MONDAY, 11/21/09  1.  Drink clear liquids the entire day-NO SOLID FOOD  2.  Do not drink anything colored red or purple.  Avoid juices with pulp.  No orange juice.  3.  Drink at least 64 oz. (8 glasses) of fluid/clear liquids during the day to prevent dehydration and help the prep work efficiently.  CLEAR LIQUIDS INCLUDE: Water Jello Ice Popsicles Tea (sugar ok, no milk/cream) Powdered fruit flavored drinks Coffee (sugar ok, no milk/cream) Gatorade Juice: apple, white grape, white cranberry  Lemonade Clear bullion, consomm, broth Carbonated beverages (any kind) Strained chicken noodle soup Hard Candy                           4.  In the morning, mix first dose of MoviPrep solution:    Empty 1 Pouch A and 1 Pouch B into the disposable container    Add lukewarm drinking water to the top line of the container. Mix to dissolve    Refrigerate (mixed solution should be used within 24 hrs)  5.  Begin drinking the prep at 5:00 p.m. The MoviPrep container is divided by 4 marks.   Every 15 minutes drink the solution down to the next mark (approximately 8 oz) until the full liter is complete.   6.  Follow completed prep with 16 oz of clear liquid of your choice (Nothing red or purple).  Continue to drink clear  liquids until bedtime.  7.  Mix second dose of MoviPrep solution:    Empty 1 Pouch A and 1 Pouch B into the disposable container    Add lukewarm drinking water to the top line of the container. Mix to dissolve    Refrigerate  Beginning at 9:00 p.m.:         1. Every 15 minutes, drink the solution down to the next mark (approx 8 oz) until the full liter is complete.         2. Follow completed prep with 16 oz. of clear liquid of your choice.    THE DAY OF YOUR PROCEDURE      TUESDAY, 11/22/09  1. You may drink clear liquids until 6:30 AM (2 HOURS BEFORE PROCEDURE).  MEDICATION INSTRUCTIONS  Unless otherwise instructed, you should take regular prescription medications with a small sip of water   as early as possible the morning of your procedure.       OTHER INSTRUCTIONS  You will need a responsible adult at least 54 years of age to accompany you and drive you home.   This person  must remain in the waiting room during your procedure.  Wear loose fitting clothing that is easily removed.  Leave jewelry and other valuables at home.  However, you may wish to bring a book to read or  an iPod/MP3 player to listen to music as you wait for your procedure to start.  Remove all body piercing jewelry and leave at home.  Total time from sign-in until discharge is approximately 2-3 hours.  You should go home directly after your procedure and rest.  You can resume normal activities the  day after your procedure.  The day of your procedure you should not:   Drive   Make legal decisions   Operate machinery   Drink alcohol   Return to work  You will receive specific instructions about eating, activities and medications before you leave.   The above instructions have been reviewed and explained to me by   Francee Piccolo, CMA (AAMA)    I fully understand and can verbalize these instructions _____________________________ Date 11/15/09

## 2010-03-16 NOTE — Progress Notes (Signed)
Summary: U/S concerns  Phone Note From Other Clinic   Summary of Call: Patient had water to drink and the Tech that would complete U/S said they could still do U/S but it would not be full/complete u/s. They prefer patient NPO for 8 hours.   I spoke with Dr.Lowne and she would like a complete scan, the Med center indicated they will contact the patient to reschedule./Chrae [facility] CMA  November 04, 2009 1:32 PM

## 2010-03-16 NOTE — Assessment & Plan Note (Signed)
Summary: REFLUX AND BP ELEVATED/CDJ   Vital Signs:  Patient profile:   55 year old female Weight:      206.8 pounds Temp:     99.0 degrees F oral Pulse rate:   80 / minute Pulse rhythm:   regular BP sitting:   120 / 90  (left arm) Cuff size:   large  Vitals Entered By: Almeta Monas CMA Duncan Dull) (November 04, 2009 11:39 AM) CC: c/o CP and elevated BP   History of Present Illness: Pt here c/o cp, gerd and increased bp for about 1 week   Pt has been taking nexium two times a day for thelast 2-3 days with no relief.  No Headache.  caffeine seems to make it worse.   Pt has had issues with GERD for years but recently worse.    Current Medications (verified): 1)  Symbicort 160-4.5 Mcg/act Aero (Budesonide-Formoterol Fumarate) .... Inhale 2 Puff Using Inhaler Twice A Day 2)  Nexium 40 Mg Cpdr (Esomeprazole Magnesium) .Marland Kitchen.. 1 By Mouth Two Times A Day 3)  Ventolin Hfa 108 (90 Base) Mcg/act Aers (Albuterol Sulfate) .... 2 Puffs Every 6 Hours As Needed For Cough or Wheezing 4)  Zithromax Z-Pak 250 Mg Tabs (Azithromycin) .... As Directed 5)  Prednisone 20 Mg Tabs (Prednisone) .... One By Mouth Daily For 5 Days 6)  Prinzide 10-12.5 Mg Tabs (Lisinopril-Hydrochlorothiazide) .Marland Kitchen.. 1 By Mouth Once Daily  Allergies (verified): No Known Drug Allergies  Past History:  Past medical, surgical, family and social histories (including risk factors) reviewed for relevance to current acute and chronic problems.  Past Medical History: Reviewed history from 06/09/2007 and no changes required. Asthma  Past Surgical History: Reviewed history from 12/07/2008 and no changes required. rt left breast bx (neg) 07/2007 Appendectomy (01/1997) Caesarean section (03/1996) Tonsillectomy plantar fascitis (rt foot) pilonidal cyst removed  Family History: Reviewed history from 12/07/2008 and no changes required. M - living F - deceased - melanoma CAD - no DM - cousin HTN - M stroke - no colon Ca - no breast  Ca - no  Social History: Reviewed history from 12/07/2008 and no changes required. 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  Teeth in good repair. Lungs:  Normal respiratory effort, chest expands symmetrically. Lungs are clear to auscultation, no crackles or wheezes. Heart:  normal rate and no murmur.   Abdomen:  + ruq tenderness and midepigastric tenderness soft, no guarding, and no rebound tenderness.   Psych:  Cognition and judgment appear intact. Alert and cooperative with normal attention span and concentration. No apparent delusions, illusions, hallucinations   Impression & Recommendations:  Problem # 1:  CHEST PAIN UNSPECIFIED (ICD-786.50) prob secondary to dyspepsia if pain returns go to ER Orders: TLB-Cardiac Panel ([id number]) T-D-Dimer Fibrin Derivatives Quantitive [id number]) T- * Misc. Laboratory test [phone])  Problem # 2:  ABDOMINAL PAIN, RIGHT UPPER QUADRANT (ICD-789.01)  prob GERD Orders: Venipuncture (65784) TLB-BMP (Basic Metabolic Panel-BMET) (80048-METABOL) TLB-CBC Platelet - w/Differential (85025-CBCD) TLB-Hepatic/Liver Function Pnl (80076-HEPATIC) TLB-H. Pylori Abs(Helicobacter Pylori) (86677-HELICO) TLB-Amylase (82150-AMYL) TLB-Lipase (83690-LIPASE) Radiology Referral (Radiology)  Discussed symptom control with the patient.   Problem # 3:  ESSENTIAL HYPERTENSION (ICD-401.9)  The following medications were removed from the medication list:  Hydrochlorothiazide 25 Mg Tabs (Hydrochlorothiazide) .Marland Kitchen... 1 by mouth once daily Her updated medication list for this problem includes:    Prinzide 10-12.5 Mg Tabs  (Lisinopril-hydrochlorothiazide) .Marland Kitchen... 1 by mouth once daily  BP today: 120/90 Prior BP: 128/82 (10/14/2009)  Labs Reviewed: K+: 3.8 (11/24/2008) Creat: : 0.79 (11/24/2008)   Chol: 196 (11/24/2008)   HDL: 49 (11/24/2008)   LDL: 122 (11/24/2008)   TG: 125 (11/24/2008)  Complete Medication List: 1)  Symbicort 160-4.5 Mcg/act Aero (Budesonide-formoterol fumarate) .... Inhale 2 puff using inhaler twice a day 2)  Nexium 40 Mg Cpdr (Esomeprazole magnesium) .Marland Kitchen.. 1 by mouth two times a day 3)  Ventolin Hfa 108 (90 Base) Mcg/act Aers (Albuterol sulfate) .... 2 puffs every 6 hours as needed for cough or wheezing 4)  Zithromax Z-pak 250 Mg Tabs (Azithromycin) .... As directed 5)  Prednisone 20 Mg Tabs (Prednisone) .... One by mouth daily for 5 days 6)  Prinzide 10-12.5 Mg Tabs (Lisinopril-hydrochlorothiazide) .Marland Kitchen.. 1 by mouth once daily  Other Orders: Gastroenterology Referral (GI)  Patient Instructions: 1)  Please schedule a follow-up appointment in 2 weeks.  2)  Avoid foods high in acid(tomatoes, citrus juices,spicy foods).Avoid eating within two hours of lying down or before exercising. Do not over eat: try smaller more frequent meals. Elevate head of bed twelve inches when sleeping.  Prescriptions: PRINZIDE 10-12.5 MG TABS (LISINOPRIL-HYDROCHLOROTHIAZIDE) 1 by mouth once daily  #30 x 0   Entered and Authorized by:   Loreen Freud DO   Signed by:   Loreen Freud DO on 11/04/2009   Method used:   Electronically to        [id number]* (retail)       [address]       [place], [place]  [address]       Ph: [phone]       Fax: [phone]   RxID:   [id number] HYDROCHLOROTHIAZIDE 25 MG TABS (HYDROCHLOROTHIAZIDE) 1 by mouth once daily  #30 x 0   Entered and Authorized by:   Loreen Freud DO   Signed by:   Loreen Freud DO on 11/04/2009   Method used:   Historical   RxID:   [id number]    EKG  Procedure date:   11/04/2009  Findings:      Normal sinus rhythm with rate of:  72 bpm

## 2010-05-17 LAB — DIFFERENTIAL
Lymphs Abs: 2.2 10*3/uL (ref 0.7–4.0)
Monocytes Relative: 8 % (ref 3–12)
Neutro Abs: 5 10*3/uL (ref 1.7–7.7)
Neutrophils Relative %: 61 % (ref 43–77)

## 2010-05-17 LAB — BASIC METABOLIC PANEL
Calcium: 9.2 mg/dL (ref 8.4–10.5)
Creatinine, Ser: 0.8 mg/dL (ref 0.4–1.2)
GFR calc Af Amer: >60 mL/min (ref >60)
GFR calc non Af Amer: >60 mL/min (ref >60)

## 2010-05-17 LAB — URINE MICROSCOPIC-ADD ON

## 2010-05-17 LAB — URINALYSIS, ROUTINE W REFLEX MICROSCOPIC
Glucose, UA: NEGATIVE mg/dL
Specific Gravity, Urine: 1.007 (ref 1.005–1.030)
Urobilinogen, UA: 0.2 mg/dL (ref 0.0–1.0)

## 2010-05-17 LAB — CBC
RBC: 4.65 MIL/uL (ref 3.87–5.11)
WBC: 8 10*3/uL (ref 4.0–10.5)

## 2010-06-27 NOTE — Op Note (Signed)
NAMEAUBRY, Vanessa Middleton             ACCOUNT NO.:  [id number]   MEDICAL RECORD NO.:  [id number]          PATIENT TYPE:  AMB   LOCATION:  SDS                          FACILITY:  [facility]   PHYSICIAN:  Leonie Man, M.D.   DATE OF BIRTH:  13-Aug-1955   DATE OF PROCEDURE:  08/20/2007  DATE OF DISCHARGE:  08/20/2007                               OPERATIVE REPORT   PREOPERATIVE DIAGNOSIS:  Atypical ductal hyperplasia of the right  breast.   POSTOPERATIVE DIAGNOSIS:  Atypical ductal hyperplasia of the right  breast.   PATHOLOGY:  Pending.   PROCEDURE:  Needle localized excision of right breast lesion.   SURGEON:  Leonie Man, MD   ASSISTANT:  OR nurse.   ANESTHESIA:  General.   SPECIMENS:  To lab right-sided breast tissue.   ESTIMATED BLOOD LOSS:  Minimal.   COMPLICATIONS:  None.   CONDITION:  The patient returned to the PACU in excellent condition.   INDICATIONS:  The patient is a 55 year old female with an abnormal  mammogram showing an area of architectural distortion in the right  breast in approximately the 12 o'clock axis.  On core needle biopsy this  shows a atypical ductal hyperplasia.  The patient now comes to the  operating room for excision of this area to rule out the possibility of  carcinoma.   The patient understands the risks and potential benefits of surgery and  comes now for needle localized excision of right-sided breast lesion.   PROCEDURE:  Following induction of satisfactory general anesthesia with  the patient positioned supinely.  The right breast was prepped and  draped to be included in a sterile operative field.  Positive  identification of the patient as Simi Briel and the operation to be  performed as right needle localized excision of right breast lesion;  surgeon as Dr. Lurene Shadow.  All the usual identifying precautions were  carried out.   The localizing needle entered at approximately the right anterior  axillary line traversing across  the breast and to the lesion to  approximately 70 mm.  I made an transverse incision just anterior to the  localizing needle, carrying this out approximately 9 cm from the  localizing needle deepening this through skin and subcutaneous tissue,  and dissecting so as to bring the localizing needle into the incision.  I took a relatively wide area of breast tissue from around the tip of  the localizing needle, carrying this dissection all the way down to the  pectoralis major muscle taking along with it the anterior pectoralis  fascia.  The tissue was then dissected free from the anterior pectoralis  fascia and removed in its entirety and forwarded for pathologic  evaluation.  The lesion was first sent for specimen mammography, the  specimen mammogram showed the lesion to be well contained within the  submitted specimen.   All areas of dissection were then checked for hemostasis.  Sponge,  instrument, and sharp counts were doubly verified.  The wound was then  thoroughly irrigated with normal saline.  The breast tissues were  reapproximated with interrupted 2-0 Vicryl sutures.  The subcutaneous  tissue was closed with 3-0 Vicryl and the skin closed with a running 5-0  Monocryl suture and then reinforced with Dermabond and Steri-Strips.  Sterile dressing was applied.  Anesthetic reversed.  The patient was  removed from the operating room to the recovery room in stable  condition.  She tolerated the procedure well.      Leonie Man, M.D.  Electronically Signed     PB/MEDQ  D:  08/20/2007  T:  08/20/2007  Job:  [id number]

## 2010-07-05 ENCOUNTER — Encounter: Payer: Self-pay | Admitting: Internal Medicine

## 2010-07-05 ENCOUNTER — Ambulatory Visit (INDEPENDENT_AMBULATORY_CARE_PROVIDER_SITE_OTHER): Payer: BC Managed Care – PPO | Admitting: Internal Medicine

## 2010-07-05 DIAGNOSIS — I10 Essential (primary) hypertension: Secondary | ICD-10-CM

## 2010-07-05 DIAGNOSIS — K219 Gastro-esophageal reflux disease without esophagitis: Secondary | ICD-10-CM

## 2010-07-05 DIAGNOSIS — R7309 Other abnormal glucose: Secondary | ICD-10-CM

## 2010-07-05 DIAGNOSIS — R930 Abnormal findings on diagnostic imaging of skull and head, not elsewhere classified: Secondary | ICD-10-CM

## 2010-07-05 DIAGNOSIS — E669 Obesity, unspecified: Secondary | ICD-10-CM

## 2010-07-05 NOTE — Assessment & Plan Note (Addendum)
See history of present illness, no classic GERD symptoms but has persistent epigastric discomfort with overeating. Plan : samples of dexilant,  If symptoms persist she will let him know; avoid over-eating

## 2010-07-05 NOTE — Progress Notes (Signed)
  Subjective:    Patient ID: Vanessa Middleton, female    DOB: 1956-02-04, 55 y.o.   MRN: [id number]  HPI Routine office visit history of an abnormal chest x-ray in 2010, was recommended for followup w/ another CXR. Hypertension, good medication compliance, ambulatory blood pressure is always less than 140/80. Mild hyperglycemia, she is doing more walking around 30 minutes 3 times a week. She has improve her diet, still recognizes that needs better portion control. History of GERD and dysphagia, was seen in the past by GI, s/p dilatation. Complains of persistent upper abdominal discomfort. Discomfort is sharp/shooting, on and off, the only trigger that is consistent is overeating.  Past Medical History  Diagnosis Date  . Asthma   . GERD (gastroesophageal reflux disease)     EGD 10/11  . Hypertension   . Fatty liver     per [place] 9/11   Past Surgical History  Procedure Date  . Breast biopsy 07/2007    neg  . Appendectomy 01/1997  . Cesarean section 03/1996  . Tonsillectomy   . Plantar fascitis     right foot  . Pilonidal cyst excision      Review of Systems Denies dysphasia or odynophagia. No classic heartburn although she has to  "clear her throat on and off" No nausea or vomiting No substernal chest pain, no exertional chest pain, no lower extremity edema. Has a history of asthma, symptoms well-controlled with Symbicort and albuterol preexercise.    Objective:   Physical Exam  Constitutional: She is oriented to person, place, and time. She appears well-developed and well-nourished. No distress.  Neck: No thyromegaly present.  Cardiovascular: Normal rate, regular rhythm and normal heart sounds.   No murmur heard. Pulmonary/Chest: Effort normal and breath sounds normal. No respiratory distress. She has no wheezes. She has no rales.  Abdominal: Soft. She exhibits no distension. There is no tenderness. There is no rebound and no guarding.  Musculoskeletal: She exhibits no edema.    Neurological: She is alert and oriented to person, place, and time.  Skin: Skin is warm and dry. She is not diaphoretic.  Psychiatric: She has a normal mood and affect. Her behavior is normal. Thought content normal.          Assessment & Plan:

## 2010-07-05 NOTE — Patient Instructions (Signed)
Switch Protonix to dexilant one tablet every morning before breakfast. If the   stomach pain continue, let me know If you've prefer dexilant , call for a prescription

## 2010-07-05 NOTE — Assessment & Plan Note (Signed)
Well-controlled,  check a BMP 

## 2010-07-05 NOTE — Assessment & Plan Note (Signed)
Labs

## 2010-07-05 NOTE — Assessment & Plan Note (Signed)
The chest x-ray in 2009 and 2010. 2010 report:  Stable nodule in the right mid lung.  Recommend continued follow-up to assess stability.  No active process. Plan, order chest x-ray

## 2010-07-05 NOTE — Assessment & Plan Note (Signed)
Doing the best she can with diet and exercise yet is unable to lose weight. Weight watchers?

## 2010-07-06 LAB — BASIC METABOLIC PANEL
GFR: 68.38 mL/min (ref >60.00)
Glucose, Bld: 108 mg/dL — ABNORMAL HIGH (ref 70–99)
Potassium: 4.3 mEq/L (ref 3.5–5.1)
Sodium: 138 mEq/L (ref 135–145)

## 2010-07-06 LAB — HEMOGLOBIN A1C: Hgb A1c MFr Bld: 5.8 % (ref 4.6–6.5)

## 2010-07-07 ENCOUNTER — Telehealth: Payer: Self-pay | Admitting: *Deleted

## 2010-07-07 NOTE — Telephone Encounter (Signed)
Pt is aware.  

## 2010-07-07 NOTE — Telephone Encounter (Signed)
Message left for patient to return my call.  

## 2010-07-07 NOTE — Telephone Encounter (Signed)
Message copied by Leanne Lovely on Fri Jul 07, 2010  9:54 AM ------      Message from: Willow Ora E      Created: Thu Jul 06, 2010  6:22 PM       Advise patient:      Her sugar test is slightly elevated, "prediabetic" , stable compared to last tets

## 2010-07-09 ENCOUNTER — Other Ambulatory Visit: Payer: Self-pay | Admitting: Internal Medicine

## 2010-07-19 ENCOUNTER — Other Ambulatory Visit: Payer: Self-pay

## 2010-07-19 MED ORDER — DEXLANSOPRAZOLE 60 MG PO CPDR: 60.0000 mg | DELAYED_RELEASE_CAPSULE | Freq: Every day | ORAL | Status: DC

## 2010-08-11 ENCOUNTER — Ambulatory Visit (INDEPENDENT_AMBULATORY_CARE_PROVIDER_SITE_OTHER)
Admission: RE | Admit: 2010-08-11 | Discharge: 2010-08-11 | Disposition: A | Discharge status: Home / Self Care | Payer: BC Managed Care – PPO | Source: Ambulatory Visit | Attending: Internal Medicine | Admitting: Internal Medicine

## 2010-08-11 DIAGNOSIS — R930 Abnormal findings on diagnostic imaging of skull and head, not elsewhere classified: Secondary | ICD-10-CM

## 2010-08-14 ENCOUNTER — Telehealth: Payer: Self-pay | Admitting: *Deleted

## 2010-08-14 NOTE — Telephone Encounter (Signed)
Message copied by Leanne Lovely on Mon Aug 14, 2010 10:40 AM ------      Message from: Vanessa Middleton      Created: Fri Aug 11, 2010  6:14 PM       Advise patient, chest x-ray stable, no need for further x-rays

## 2010-08-14 NOTE — Telephone Encounter (Signed)
Message left for patient to return my call.  

## 2010-08-15 NOTE — Telephone Encounter (Signed)
Pt aware of x-ray

## 2010-08-15 NOTE — Telephone Encounter (Signed)
Message left for patient to return my call.  

## 2010-09-05 ENCOUNTER — Other Ambulatory Visit: Payer: Self-pay | Admitting: Internal Medicine

## 2010-10-25 ENCOUNTER — Other Ambulatory Visit: Payer: Self-pay | Admitting: Internal Medicine

## 2010-11-01 ENCOUNTER — Emergency Department (INDEPENDENT_AMBULATORY_CARE_PROVIDER_SITE_OTHER): Payer: BC Managed Care – PPO

## 2010-11-01 ENCOUNTER — Encounter (HOSPITAL_BASED_OUTPATIENT_CLINIC_OR_DEPARTMENT_OTHER): Payer: Self-pay | Admitting: Emergency Medicine

## 2010-11-01 ENCOUNTER — Emergency Department (HOSPITAL_BASED_OUTPATIENT_CLINIC_OR_DEPARTMENT_OTHER)
Admission: EM | Admit: 2010-11-01 | Discharge: 2010-11-01 | Disposition: A | Discharge status: Home / Self Care | Payer: BC Managed Care – PPO | Attending: Emergency Medicine | Admitting: Emergency Medicine

## 2010-11-01 ENCOUNTER — Telehealth: Payer: Self-pay | Admitting: Internal Medicine

## 2010-11-01 DIAGNOSIS — B349 Viral infection, unspecified: Secondary | ICD-10-CM

## 2010-11-01 DIAGNOSIS — R51 Headache: Secondary | ICD-10-CM | POA: Insufficient documentation

## 2010-11-01 DIAGNOSIS — IMO0002 Reserved for concepts with insufficient information to code with codable children: Secondary | ICD-10-CM

## 2010-11-01 DIAGNOSIS — H538 Other visual disturbances: Secondary | ICD-10-CM

## 2010-11-01 DIAGNOSIS — R221 Localized swelling, mass and lump, neck: Secondary | ICD-10-CM

## 2010-11-01 DIAGNOSIS — E871 Hypo-osmolality and hyponatremia: Secondary | ICD-10-CM

## 2010-11-01 DIAGNOSIS — I10 Essential (primary) hypertension: Secondary | ICD-10-CM | POA: Insufficient documentation

## 2010-11-01 DIAGNOSIS — B9789 Other viral agents as the cause of diseases classified elsewhere: Secondary | ICD-10-CM | POA: Insufficient documentation

## 2010-11-01 DIAGNOSIS — R42 Dizziness and giddiness: Secondary | ICD-10-CM | POA: Insufficient documentation

## 2010-11-01 LAB — CBC
Hemoglobin: 13.4 g/dL (ref 12.0–15.0)
Platelets: 313 10*3/uL (ref 150–400)
RBC: 4.76 MIL/uL (ref 3.87–5.11)
WBC: 9.3 10*3/uL (ref 4.0–10.5)

## 2010-11-01 LAB — BASIC METABOLIC PANEL
BUN: 8 mg/dL (ref 6–23)
Chloride: 92 mEq/L — ABNORMAL LOW (ref 96–112)
GFR calc Af Amer: >60 mL/min (ref >60)
Glucose, Bld: 103 mg/dL — ABNORMAL HIGH (ref 70–99)
Potassium: 3.3 mEq/L — ABNORMAL LOW (ref 3.5–5.1)
Sodium: 129 mEq/L — ABNORMAL LOW (ref 135–145)

## 2010-11-01 LAB — DIFFERENTIAL
Lymphs Abs: 1.5 10*3/uL (ref 0.7–4.0)
Monocytes Relative: 13 % — ABNORMAL HIGH (ref 3–12)
Neutro Abs: 6.5 10*3/uL (ref 1.7–7.7)
Neutrophils Relative %: 70 % (ref 43–77)

## 2010-11-01 NOTE — Discharge Instructions (Signed)
Dizziness Dizziness is a common problem with numerous causes. Some of these causes are:  Middle ear problems.   Side effects of infections.   Aging.   Side effects of medicines.   Problems with circulation.  You have been examined and no life-threatening reasons were found for the dizziness. None of the problems listed above were found to be a cause that would require further hospital observation. Your caregiver feels it is safe for you to go home and to be observed by a family member or friend. Your caregiver feels nothing that is happening is a danger to you. HOME CARE INSTRUCTIONS  Drink enough water and fluids to keep your urine clear or pale yellow. This is especially important in very hot weather. As you grow older, it is also important in cold weather.   If you are dizzy from medicines, take them as directed. When taking blood pressure medicines, it is especially important to get up slowly.   Rise slowly from chairs and steady yourself until you feel okay.   In the morning, first sit up on the side of the bed. When this seems okay, stand slowly while holding onto something until you know your balance is fine.   If you get dizzy from standing still in one place for too long, be sure to move the legs often. Tighten and relax the muscles in the legs while standing.   If dizziness continues to be a problem, have someone stay with you for a day or two. Do this until you feel you are doing well enough to stay alone. Have that person call your caregiver if he or she notices changes in you that are concerning.  SEEK IMMEDIATE MEDICAL CARE IF:  Your dizziness or lightheadedness gets worse.   You feel sick to your stomach (nauseous) or throw up (vomit).   You develop problems with talking, walking, weakness, or using the arms, hands, or legs.   You are not thinking clearly or has difficulty forming sentences. It may take a friend or family member to determine if your thinking is  normal.   You develop chest pain, belly (abdominal) pain, or shortness of breath.   Your vision changes.   There are side effects from medicine which seem to be getting worse rather than better.  MAKE SURE YOU:  Understand these instructions.   Will watch your condition.   Will get help right away if you are not doing well or get worse.  Document Released: 07/25/2000 Document Re-Released: 04/25/2009 [facility] Patient Information 2011 [place], [place].  Hyponatremia  (Low Serum Sodium) Hyponatremia is when the amount of sodium (salt) in your blood is too low.  CAUSES Many things can cause hyponatremia such as:  Having too much water in your blood. Too much water can weaken (dilute) the sodium in your body. This happens in conditions such as heart, kidney or liver failure.   Some illnesses or certain medications can cause low sodium levels even though your total body water volume does not change.   Having too little water and sodium in your blood. This can happen when a person becomes dehydrated (i.e., sweating with exercise or a stomach flu with nausea and diarrhea). Both water and sodium are lost.  SYMPTOMS When sodium levels are low, your cells tend to absorb the extra water and swell. The swelling happens throughout the body, but it mostly affects the brain. Severe brain swelling (cerebral edema) can cause death and can happen with sudden onset of hyponatremia (  developing over 48 hours or less). Seizures or coma can also happen. Less severe hyponatremia can cause:  Feeling sick to your stomach (nausea) and throwing up (vomiting).  A hard time focusing.   Confusion.   Lethargy.   Agitation.  Headache.   Seizures.   Anorexia (loss of appetite).   Muscle weakness and cramping.   DIAGNOSIS Hyponatremia is identified by a simple blood test. Your caregiver will perform a history and physical exam to try to find the cause and type of hyponatremia. Other tests may be needed to  measure the amount of sodium in your blood and urine. TREATMENT Treatment will depend on the cause.   If the sodium is dangerously low, fluids are given through the vein. Medications may also be used to correct the imbalance. If medications are causing the problem, your prescriptions will need to be adjusted.   Restriction of water or fluid intake is often needed to restore proper balance.  The speed of correcting the sodium problem is very important. If the problem is corrected too fast, then nerve damage (sometimes unchangeable) can happen. HOME CARE INSTRUCTIONS  Take medications as directed by your caregiver. Many medications can make hyponatremia worse. Be sure to discuss all your medications with your caregiver.   Carefully follow any recommended diet including any fluid restriction.   You may be asked to repeat lab tests. Be sure to follow these directions.   Avoid alcohol and recreational drugs. Hyponatremia can happen in people with poor diets who drink large amounts of beer (beer potomania), and in people after use of the recreational drug MDMA or ecstasy.  SEEK MEDICAL CARE IF:  You develop worsening nausea, tiredness (fatigue), headache, confusion or weakness.   You get your original hyponatremia symptoms again.   You have problems following the diet recommended.  SEEK IMMEDIATE MEDICAL CARE IF:  You have shaking that you cannot control (seizure).   You loose consciousness (black out).   You have ongoing diarrhea or vomiting.  MAKE SURE YOU:   Understand these instructions.   Will watch your condition.   Will get help right away if you are not doing well or get worse.  Document Released: 01/19/2002 Document Re-Released: 01/12/2008 [facility] Patient Information 2011 [place], [place].

## 2010-11-01 NOTE — ED Notes (Signed)
Pt c/o persistent dizziness since Mon am; reports she had an apparent syncopal episode after NVD Mon am; woke up on floor bleeding from head (abrasion noted to RT side scalp); also c/o cold sx now.

## 2010-11-01 NOTE — ED Provider Notes (Signed)
History     CSN: [id number] Arrival date & time: 11/01/2010  5:03 PM   Chief Complaint  Patient presents with  . Dizziness  . Headache     (Include location/radiation/quality/duration/timing/severity/associated sxs/prior treatment) HPI Pt reports 3 nights ago she woke with multiple episodes of vomiting and diarrhea. At some point during the night, she was vomiting into the sink in her bathroom when she had a syncopal episode. She had an abrasion to her head with small amount of bleeding so assumed she had hit her head when she fell. She spent the following day in bed and yesterday noticed she was feeling dizzy. She does not describe any presyncopal or vertiginous dizziness, but more of a swimmy headed feeling when she looks off in the distance or downward. She was feeling better today but noted that she has begun to have some URI symptoms developing this morning. Coworkers and PCP advised her to come to the ED for eval.. Denies headache, vision or hearing changes. No dysequilibrium or focal neuro complaints.   Past Medical History  Diagnosis Date  . Asthma   . GERD (gastroesophageal reflux disease)     EGD 10/11  . Hypertension   . Fatty liver     per [place] 9/11     Past Surgical History  Procedure Date  . Breast biopsy 07/2007    neg  . Appendectomy 01/1997  . Cesarean section 03/1996  . Tonsillectomy   . Plantar fascitis     right foot  . Pilonidal cyst excision     Family History  Problem Relation Age of Onset  . Melanoma Father   . Coronary artery disease Neg Hx   . Diabetes      cousin  . Hypertension Mother   . Stroke Neg Hx   . Colon cancer Neg Hx   . Breast cancer Neg Hx     History  Substance Use Topics  . Smoking status: Former Games developer  . Smokeless tobacco: Not on file  . Alcohol Use: Yes     2-3 x/week    OB History    Grav Para Term Preterm Abortions TAB SAB Ect Mult Living                  Review of Systems All other systems reviewed and are  negative except as noted in HPI.    Allergies  Review of patient's allergies indicates no known allergies.  Home Medications   Current Outpatient Rx  Name Route Sig Dispense Refill  . VITAMIN D 1000 UNITS PO TABS Oral Take 2,000 Units by mouth daily.      . DEXLANSOPRAZOLE 60 MG PO CPDR Oral Take 60 mg by mouth daily.      Marland Kitchen HYDROCHLOROTHIAZIDE 25 MG PO TABS  TAKE 1 TABLET BY MOUTH ONCE DAILY 90 tablet 1  . SYMBICORT 160-4.5 MCG/ACT IN AERO  INHALE 2 PUFFS TWICE DAILY 10.2 g 0  . VENTOLIN HFA 108 (90 BASE) MCG/ACT IN AERS  INHALE 2 PUFFS EVERY 6 HOURS AS NEEDED FOR COUGH OR WHEEZING 18 g 0    Physical Exam    BP 138/99  Pulse 87  Temp(Src) 97.7 F (36.5 C) (Oral)  Resp 18  SpO2 100%  LMP 05/01/2010  Physical Exam  Nursing note and vitals reviewed. Constitutional: She is oriented to person, place, and time. She appears well-developed and well-nourished.  HENT:  Head: Normocephalic and atraumatic.       Cerumen in both ears  Eyes:  EOM are normal. Pupils are equal, round, and reactive to light.  Neck: Normal range of motion. Neck supple.  Cardiovascular: Normal rate, normal heart sounds and intact distal pulses.   Pulmonary/Chest: Effort normal and breath sounds normal.  Abdominal: Bowel sounds are normal. She exhibits no distension. There is no tenderness.  Musculoskeletal: Normal range of motion. She exhibits no edema and no tenderness.  Neurological: She is alert and oriented to person, place, and time. She has normal strength. No cranial nerve deficit or sensory deficit. She exhibits normal muscle tone. Coordination normal.  Skin: Skin is warm and dry. No rash noted.  Psychiatric: She has a normal mood and affect.    ED Course  Procedures   Ct Head Wo Contrast  11/01/2010  *RADIOLOGY REPORT*  Clinical Data: 2 days ago.  Hit back of head.  Headache, dizziness, nausea, and blurred vision.  CT HEAD WITHOUT CONTRAST  Technique:  Contiguous axial images were obtained from  the base of the skull through the vertex without contrast.  Comparison: None.  Findings: Mild soft tissue swelling is present over the occiput without underlying fracture.  The paranasal sinuses and mastoid air cells are clear.  Mild atrophy is likely within normal limits for age.  No acute cortical infarct, hemorrhage, or mass lesion is present.  IMPRESSION:  1.  Minimal extracranial soft tissue swelling at the occiput without underlying fracture. 2.  Normal CT appearance of the brain.  Original Report Authenticated By: Jamesetta Orleans. MATTERN, M.D.   Results for orders placed during the hospital encounter of 11/01/10  CBC      Component Value Range   WBC 9.3  4.0 - 10.5 (K/uL)   RBC 4.76  3.87 - 5.11 (MIL/uL)   Hemoglobin 13.4  12.0 - 15.0 (g/dL)   HCT 16.1  09.6 - 04.5 (%)   MCV 81.1  78.0 - 100.0 (fL)   MCH 28.2  26.0 - 34.0 (pg)   MCHC 34.7  30.0 - 36.0 (g/dL)   RDW 40.9  81.1 - 91.4 (%)   Platelets 313  150 - 400 (K/uL)  DIFFERENTIAL      Component Value Range   Neutrophils Relative 70  43 - 77 (%)   Neutro Abs 6.5  1.7 - 7.7 (K/uL)   Lymphocytes Relative 16  12 - 46 (%)   Lymphs Abs 1.5  0.7 - 4.0 (K/uL)   Monocytes Relative 13 (*) 3 - 12 (%)   Monocytes Absolute 1.2 (*) 0.1 - 1.0 (K/uL)   Eosinophils Relative 1  0 - 5 (%)   Eosinophils Absolute 0.1  0.0 - 0.7 (K/uL)   Basophils Relative 0  0 - 1 (%)   Basophils Absolute 0.0  0.0 - 0.1 (K/uL)  BASIC METABOLIC PANEL      Component Value Range   Sodium 129 (*) 135 - 145 (mEq/L)   Potassium 3.3 (*) 3.5 - 5.1 (mEq/L)   Chloride 92 (*) 96 - 112 (mEq/L)   CO2 26  19 - 32 (mEq/L)   Glucose, Bld 103 (*) 70 - 99 (mg/dL)   BUN 8  6 - 23 (mg/dL)   Creatinine, Ser 7.82  0.50 - 1.10 (mg/dL)   Calcium 9.2  8.4 - 95.6 (mg/dL)   GFR calc non Af Amer >60  >60 (mL/min)   GFR calc Af Amer >60  >60 (mL/min)      MDM Pt feeling better and ready to go home. She has mild hyponatremia, but otherwise unremarkable labs and  CT. Will d/c with  PCP followup and advised to return to the ER for any other concerns. Symptoms likely partly due to dehydration and partly to viral syndrome.        Ferman Basilio B. Bernette Mayers, MD 11/01/10 [facility]

## 2010-11-02 ENCOUNTER — Ambulatory Visit: Payer: BC Managed Care – PPO | Admitting: Family Medicine

## 2010-11-09 LAB — CBC
HCT: 42
Hemoglobin: 14.5
MCV: 87.9
Platelets: 343
WBC: 10.4

## 2010-11-09 LAB — DIFFERENTIAL
Eosinophils Absolute: 0.1
Eosinophils Relative: 1
Lymphocytes Relative: 27
Lymphs Abs: 2.8
Monocytes Absolute: 1

## 2010-11-09 LAB — BASIC METABOLIC PANEL
BUN: 13
Chloride: 100
GFR calc non Af Amer: >60
Glucose, Bld: 88
Potassium: 4.6
Sodium: 135

## 2010-11-14 ENCOUNTER — Encounter: Payer: Self-pay | Admitting: Internal Medicine

## 2010-11-14 ENCOUNTER — Ambulatory Visit (INDEPENDENT_AMBULATORY_CARE_PROVIDER_SITE_OTHER): Payer: BC Managed Care – PPO | Admitting: Internal Medicine

## 2010-11-14 DIAGNOSIS — R930 Abnormal findings on diagnostic imaging of skull and head, not elsewhere classified: Secondary | ICD-10-CM

## 2010-11-14 DIAGNOSIS — J45909 Unspecified asthma, uncomplicated: Secondary | ICD-10-CM

## 2010-11-14 DIAGNOSIS — Z0000 Encounter for general adult medical examination without abnormal findings: Secondary | ICD-10-CM | POA: Insufficient documentation

## 2010-11-14 NOTE — Telephone Encounter (Signed)
error 

## 2010-11-14 NOTE — Assessment & Plan Note (Signed)
Last XR stable, no further w/u

## 2010-11-14 NOTE — Progress Notes (Signed)
  Subjective:    Patient ID: Vanessa Middleton, female    DOB: [birth year], 55 y.o.   MRN: [id number]  HPI  CPX Had a syncope 11-01-10 in the context of severe watery diarrhea, nausea, went to the ER : CT head normal, CBC ok, K slt low; no further sx, no HA. Sx likely related to acute GI problem , rec observation  Past Medical History  Diagnosis Date  . Asthma   . GERD (gastroesophageal reflux disease)     EGD 10/11  . Hypertension   . Fatty liver     per [place] 9/11   Past Surgical History  Procedure Date  . Breast biopsy 07/2007    neg  . Appendectomy 01/1997  . Cesarean section 03/1996  . Tonsillectomy   . Plantar fascitis     right foot  . Pilonidal cyst excision    History   Social History  . Marital Status: Divorced    Spouse Name: N/A    Number of Children: 1   . Years of Education: N/A   Occupational History  . Customer Service     Social History Main Topics  . Smoking status: Former Smoker    Quit date: 02/13/1996  . Smokeless tobacco: Not on file   Comment: used to smoke 2 ppd  . Alcohol Use: Yes     2-3 x/week  . Drug Use: No  . Sexually Active: Not on file   Other Topics Concern  . Not on file   Social History Narrative   Regular exercise- trying to walk x 3 /weekDiet- better, has lost almost 17 pounds in the last few months !!   Family History  Problem Relation Age of Onset  . Melanoma Father   . Coronary artery disease Neg Hx   . Stroke Neg Hx   . Colon cancer Neg Hx   . Breast cancer Neg Hx   . Diabetes      cousin  . Hypertension Mother      Review of Systems  Respiratory: Negative for cough, shortness of breath and wheezing.   Cardiovascular: Negative for chest pain and leg swelling.  Gastrointestinal: Negative for abdominal pain and blood in stool.  Genitourinary: Negative for dysuria, hematuria and difficulty urinating.  Psychiatric/Behavioral:       No anxiety-depression       Objective:   Physical Exam  Constitutional: She is  oriented to person, place, and time. She appears well-developed and well-nourished. No distress.  HENT:  Head: Normocephalic and atraumatic.  Eyes: EOM are normal. Pupils are equal, round, and reactive to light.  Neck: No thyromegaly present.       Nl carotid pulse   Cardiovascular: Normal rate, regular rhythm and normal heart sounds.   No murmur heard. Pulmonary/Chest: Effort normal and breath sounds normal. No respiratory distress. She has no wheezes. She has no rales.  Abdominal: Soft. She exhibits no distension. There is no tenderness. There is no rebound and no guarding.  Musculoskeletal: She exhibits no edema.  Neurological: She is alert and oriented to person, place, and time.  Skin: She is not diaphoretic.  Psychiatric: She has a normal mood and affect. Her behavior is normal. Judgment and thought content normal.          Assessment & Plan:

## 2010-11-14 NOTE — Patient Instructions (Signed)
Came back fasting: CMP FLP---dx V70 A1C---dx hyperglycemia

## 2010-11-14 NOTE — Assessment & Plan Note (Addendum)
Td 2002 To have  a flu shot at work  Colonoscopy 11-2009, 3 adenomatous polyps, next in 3 years Bone density ----> 12-2008--normal  Will see  gyn in October  Pap smear per gyn   Mammogram due in October   labs .  diet and exercise discussed: doing great! See weight

## 2010-11-14 NOTE — Assessment & Plan Note (Signed)
Well controlled , change to steroids only-inhalers after the winter?

## 2010-11-21 ENCOUNTER — Other Ambulatory Visit: Payer: Self-pay | Admitting: Internal Medicine

## 2010-11-21 DIAGNOSIS — Z0000 Encounter for general adult medical examination without abnormal findings: Secondary | ICD-10-CM

## 2010-11-21 DIAGNOSIS — E781 Pure hyperglyceridemia: Secondary | ICD-10-CM

## 2010-11-22 ENCOUNTER — Other Ambulatory Visit (INDEPENDENT_AMBULATORY_CARE_PROVIDER_SITE_OTHER): Payer: BC Managed Care – PPO

## 2010-11-22 DIAGNOSIS — Z0000 Encounter for general adult medical examination without abnormal findings: Secondary | ICD-10-CM

## 2010-11-22 DIAGNOSIS — E781 Pure hyperglyceridemia: Secondary | ICD-10-CM

## 2010-11-22 LAB — COMPREHENSIVE METABOLIC PANEL
Alkaline Phosphatase: 62 U/L (ref 39–117)
BUN: 13 mg/dL (ref 6–23)
CO2: 29 mEq/L (ref 19–32)
Creatinine, Ser: 0.9 mg/dL (ref 0.4–1.2)
GFR: 73.87 mL/min (ref >60.00)
Glucose, Bld: 101 mg/dL — ABNORMAL HIGH (ref 70–99)
Sodium: 140 mEq/L (ref 135–145)
Total Bilirubin: 0.4 mg/dL (ref 0.3–1.2)

## 2010-11-22 LAB — LIPID PANEL
Cholesterol: 171 mg/dL (ref 0–200)
HDL: 50.5 mg/dL (ref >39.00)
LDL Cholesterol: 104 mg/dL — ABNORMAL HIGH (ref 0–99)
Triglycerides: 82 mg/dL (ref 0.0–149.0)
VLDL: 16.4 mg/dL (ref 0.0–40.0)

## 2010-11-22 NOTE — Progress Notes (Signed)
Labs only

## 2010-11-27 ENCOUNTER — Other Ambulatory Visit: Payer: Self-pay | Admitting: Internal Medicine

## 2010-11-27 NOTE — Telephone Encounter (Signed)
Done

## 2011-01-16 ENCOUNTER — Other Ambulatory Visit: Payer: Self-pay | Admitting: Internal Medicine

## 2011-01-16 DIAGNOSIS — Z1231 Encounter for screening mammogram for malignant neoplasm of breast: Secondary | ICD-10-CM

## 2011-01-30 ENCOUNTER — Ambulatory Visit
Admission: RE | Admit: 2011-01-30 | Discharge: 2011-01-30 | Disposition: A | Discharge status: Home / Self Care | Payer: BC Managed Care – PPO | Source: Ambulatory Visit | Attending: Internal Medicine | Admitting: Internal Medicine

## 2011-01-30 DIAGNOSIS — Z1231 Encounter for screening mammogram for malignant neoplasm of breast: Secondary | ICD-10-CM

## 2011-03-12 ENCOUNTER — Other Ambulatory Visit: Payer: Self-pay | Admitting: Internal Medicine

## 2011-03-12 NOTE — Telephone Encounter (Signed)
Refill done.  

## 2011-05-21 ENCOUNTER — Encounter: Payer: Self-pay | Admitting: Family Medicine

## 2011-05-21 ENCOUNTER — Ambulatory Visit (INDEPENDENT_AMBULATORY_CARE_PROVIDER_SITE_OTHER): Payer: BC Managed Care – PPO | Admitting: Family Medicine

## 2011-05-21 VITALS — BP 122/77 | HR 96 | Temp 100.1°F | Ht 63.5 in | Wt 191.6 lb

## 2011-05-21 DIAGNOSIS — J45909 Unspecified asthma, uncomplicated: Secondary | ICD-10-CM | POA: Insufficient documentation

## 2011-05-21 MED ORDER — GUAIFENESIN-CODEINE 100-10 MG/5ML PO SYRP: 10.0000 mL | ORAL_SOLUTION | Freq: Three times a day (TID) | ORAL | Status: AC | PRN

## 2011-05-21 MED ORDER — AZITHROMYCIN 250 MG PO TABS: ORAL_TABLET | ORAL | Status: AC

## 2011-05-21 NOTE — Patient Instructions (Signed)
This is a bronchitis Start the Azithromycin as directed Use the cough syrup as needed- may cause drowsiness Drink plenty of fluids REST! Hang in there!

## 2011-05-21 NOTE — Progress Notes (Signed)
  Subjective:    Patient ID: Vanessa Middleton, female    DOB: 05/22/1955, 56 y.o.   MRN: [id number]  HPI ? Bronchitis- pt w/ hx of similar.  Started feeling bad on Friday.  Denies nasal congestion.  + fever, HA, 'coughing, coughing, coughing'.  Cough is productive of 'yellow brown stuff'.  No known sick contacts.  No N/V/D.  Bilateral ear fullness.  Onset was gradual.  + body aches.   Review of Systems For ROS see HPI     Objective:   Physical Exam  Vitals reviewed. Constitutional: She appears well-developed and well-nourished. No distress.  HENT:  Head: Normocephalic and atraumatic.       TMs normal bilaterally Mild nasal congestion Throat w/out erythema, edema, or exudate  Eyes: Conjunctivae and EOM are normal. Pupils are equal, round, and reactive to light.  Neck: Normal range of motion. Neck supple.  Cardiovascular: Normal rate, regular rhythm, normal heart sounds and intact distal pulses.   No murmur heard. Pulmonary/Chest: Effort normal. No respiratory distress. She has wheezes (expiratory wheezes on R, CTA on L).       + hacking cough  Lymphadenopathy:    She has no cervical adenopathy.          Assessment & Plan:

## 2011-05-21 NOTE — Assessment & Plan Note (Signed)
New.  Start abx given pt's hx of asthma.  Reviewed supportive care and red flags that should prompt return.  Pt expressed understanding and is in agreement w/ plan.

## 2011-06-07 ENCOUNTER — Other Ambulatory Visit: Payer: Self-pay | Admitting: Internal Medicine

## 2011-06-07 NOTE — Telephone Encounter (Signed)
Refill done.  

## 2011-07-21 ENCOUNTER — Other Ambulatory Visit: Payer: Self-pay | Admitting: Internal Medicine

## 2011-07-23 NOTE — Telephone Encounter (Signed)
Refill done.  

## 2011-09-23 ENCOUNTER — Other Ambulatory Visit: Payer: Self-pay | Admitting: Internal Medicine

## 2011-10-14 DIAGNOSIS — F419 Anxiety disorder, unspecified: Secondary | ICD-10-CM

## 2011-10-14 DIAGNOSIS — F32A Depression, unspecified: Secondary | ICD-10-CM

## 2011-10-14 HISTORY — DX: Anxiety disorder, unspecified: F41.9

## 2011-10-14 HISTORY — DX: Depression, unspecified: F32.A

## 2011-10-29 ENCOUNTER — Encounter: Payer: Self-pay | Admitting: Internal Medicine

## 2011-10-29 ENCOUNTER — Ambulatory Visit (INDEPENDENT_AMBULATORY_CARE_PROVIDER_SITE_OTHER): Payer: BC Managed Care – PPO | Admitting: Internal Medicine

## 2011-10-29 VITALS — BP 138/84 | HR 76 | Temp 98.6°F | Wt 205.0 lb

## 2011-10-29 DIAGNOSIS — F341 Dysthymic disorder: Secondary | ICD-10-CM

## 2011-10-29 DIAGNOSIS — F329 Major depressive disorder, single episode, unspecified: Secondary | ICD-10-CM

## 2011-10-29 DIAGNOSIS — F419 Anxiety disorder, unspecified: Secondary | ICD-10-CM | POA: Insufficient documentation

## 2011-10-29 MED ORDER — ALPRAZOLAM 0.5 MG PO TABS: 0.5000 mg | ORAL_TABLET | Freq: Three times a day (TID) | ORAL | Status: DC | PRN

## 2011-10-29 MED ORDER — ESCITALOPRAM OXALATE 10 MG PO TABS: 10.0000 mg | ORAL_TABLET | Freq: Every day | ORAL | Status: DC

## 2011-10-29 NOTE — Progress Notes (Signed)
  Subjective:    Patient ID: Vanessa Middleton, female    DOB: [birth year], 56 y.o.   MRN: [id number]  HPI Here to discuss anxiety and depression The father of her son was living at the patient's house up until recently when she was able to send him to rehabilitation. Her son is having a lot of behavioral issues. She knows she has been anxious and depressed for a while, saw recently the nurse at her job last week and started Cymbalta for the last 3 days. Here for further advice. Feels a little "jittery" with Cymbalta, does not like it much.  Past medical history Hypertension Fatty liver per ultrasound 2011 Asthma GERD, EGD 2011 Anxiety, depression DX 10-2011  Past medical history Breast biopsy, negative 07-2007 Appendectomy 1998 C-section 1998   Plantar fasciitis, right foot Pilonidal cyst  Excision  Review of Systems Denies suicidal thoughts Sleeping poorly Does not smoke, drinks very little alcohol.     Objective:   Physical Exam General -- alert, well-developed Psych-- Cognition and judgment appear intact. Alert and cooperative with normal attention span and concentration.  mildly anxious and depressed appearing.   PHQ 9 ----> 15      Assessment & Plan:  Today , I spent more than 25 min with the patient, >50% of the time counseling

## 2011-10-29 NOTE — Patient Instructions (Addendum)
Taking the medication as prescribed, and discontinue Cymbalta Please come back in 4-5 weeks for a recheck.

## 2011-10-29 NOTE — Assessment & Plan Note (Addendum)
Situational anxiety and depression related to family issues. PHQ 9 is #15 , moderate to severe depression. She's not suicidal Patient is counseled here. She is already going to counseling with her son Recommend to start Lexapro and Xanax as needed. Discontinue Cymbalta Followup in 4-5 weeks.

## 2011-11-18 ENCOUNTER — Other Ambulatory Visit: Payer: Self-pay | Admitting: Internal Medicine

## 2011-11-19 NOTE — Telephone Encounter (Signed)
Refill done.  

## 2011-11-20 ENCOUNTER — Telehealth: Payer: Self-pay | Admitting: Internal Medicine

## 2011-11-20 NOTE — Telephone Encounter (Signed)
Pt needs to have cpe by end of October for her work otherwise she loses her discount. Can you see if there is anything I can schedule for her? Thanks Vanessa Middleton amy

## 2011-11-20 NOTE — Telephone Encounter (Signed)
What about Thursday 10.17.13 @ 3pm.

## 2011-11-27 ENCOUNTER — Encounter: Payer: Self-pay | Admitting: Internal Medicine

## 2011-11-27 ENCOUNTER — Ambulatory Visit (INDEPENDENT_AMBULATORY_CARE_PROVIDER_SITE_OTHER): Payer: BC Managed Care – PPO | Admitting: Internal Medicine

## 2011-11-27 VITALS — BP 118/74 | HR 78 | Temp 98.4°F | Ht 64.0 in | Wt 197.0 lb

## 2011-11-27 DIAGNOSIS — Z0000 Encounter for general adult medical examination without abnormal findings: Secondary | ICD-10-CM

## 2011-11-27 DIAGNOSIS — F329 Major depressive disorder, single episode, unspecified: Secondary | ICD-10-CM

## 2011-11-27 DIAGNOSIS — Z23 Encounter for immunization: Secondary | ICD-10-CM

## 2011-11-27 MED ORDER — ALPRAZOLAM 0.5 MG PO TABS: 0.5000 mg | ORAL_TABLET | Freq: Three times a day (TID) | ORAL | Status: DC | PRN

## 2011-11-27 MED ORDER — ESCITALOPRAM OXALATE 10 MG PO TABS: 15.0000 mg | ORAL_TABLET | Freq: Every day | ORAL | Status: DC

## 2011-11-27 NOTE — Assessment & Plan Note (Signed)
Started Lexapro about 3 weeks ago, anxiety and depression are only slightly better, no suicidal thoughts. I recommend the patient to actually continue Lexapro for at least 6 weeks, we'll increase the dose to  15 mg  She is reluctant to use Xanax but very sporadically, I told her it is okay to take it as needed. See instructions. Reassess in 4 weeks from today, consider Wellbutrin or change to another agent. Continue counseling

## 2011-11-27 NOTE — Patient Instructions (Addendum)
Continue Lexapro,  increase to 1.5 tablet daily Okay to use alprazolam ( xanax) as needed, you could use a low dose like  half tablet twice a day and one tablet at bedtime. --- Please come back fasting for labs CMP, FLP, TSH, vit D, CBC -- annual exam A1c --- hyperglycemia ---- Next visit 4 weeks

## 2011-11-27 NOTE — Progress Notes (Signed)
  Subjective:    Patient ID: Vanessa Middleton, female    DOB: 1956/01/04, 56 y.o.   MRN: [id number]  HPI Complete physical exam We also discussed anxiety and depression, symptoms are not well controlled at the present time, see assessment and plan.  Past medical history Hypertension Fatty liver per ultrasound 2011 Asthma GERD, EGD 2011 Anxiety, depression DX 10-2011  Past medical history Breast biopsy, negative 07-2007 Appendectomy 1998 C-section 1998    Plantar fasciitis, right foot Pilonidal cyst  Excision  Family History: M - living F - deceased - melanoma CAD - no DM - cousin HTN - M stroke - no colon Ca - no breast Ca - no  Social History: works in Clinical biochemist Alcohol use-yes (2-3 x /week) Former Smoker (quit 1998) Single, 1 son (71 y/o) Regular exercise--- trying to walk more diet-- has changed mostky d/t GERD , also trying to eat less fat     Review of Systems Denies chest pain or shortness of breath No nausea, vomiting, occasional diarrhea usually related to feeling anxious. No dysuria or gross hematuria Asthma symptoms are well-controlled, good medication compliance. Complains of easy bruising, has not noticed any blood in the stools, no bleeding with brushing her teeth.     Objective:   Physical Exam General -- alert, well-developed , BMI 33.   Neck --no thyromegaly Lungs -- normal respiratory effort, no intercostal retractions, no accessory muscle use, and normal breath sounds.   Heart-- normal rate, regular rhythm, no murmur, and no gallop.   Abdomen--soft, non-tender, no distention, no masses, no HSM, no guarding, and no rigidity.   Extremities-- no pretibial edema bilaterally  Neurologic-- alert & oriented X3 and strength normal in all extremities. Psych-- Cognition and judgment appear intact. Alert and cooperative with normal attention span and concentration.  Still emotional during the interview but less anxious than before.         Assessment & Plan:

## 2011-11-27 NOTE — Assessment & Plan Note (Addendum)
Td 2002 and today Flu shot today  Colonoscopy 11-2009, 3 adenomatous polyps, next in 3 years Bone density ----> 12-2008--normal  Female care per gyn labs .  diet and exercise discussed

## 2011-11-28 ENCOUNTER — Other Ambulatory Visit: Payer: Self-pay | Admitting: Internal Medicine

## 2011-11-28 MED ORDER — HYDROCHLOROTHIAZIDE 25 MG PO TABS: 25.0000 mg | ORAL_TABLET | Freq: Every day | ORAL | Status: DC

## 2011-11-28 NOTE — Addendum Note (Signed)
Addended by: Edwena Felty T on: 11/28/2011 08:07 AM   Modules accepted: Orders

## 2011-11-28 NOTE — Telephone Encounter (Signed)
REFILLS X 2--LAST OV 10.15.13 V70  1-HYDROCHLORTHIAZIDE 25MG  TABLETS #90 TK 1 T PO ONCE DAILY -- LAST FILL 10.12.13  2-ESCITALOPRAM 10MG  TABLETS #30 TK 1 T PO D -- LAST FILL 10.12.13

## 2011-11-28 NOTE — Telephone Encounter (Signed)
Refill done.  

## 2011-11-30 ENCOUNTER — Other Ambulatory Visit (INDEPENDENT_AMBULATORY_CARE_PROVIDER_SITE_OTHER): Payer: BC Managed Care – PPO

## 2011-11-30 DIAGNOSIS — Z0000 Encounter for general adult medical examination without abnormal findings: Secondary | ICD-10-CM

## 2011-11-30 DIAGNOSIS — R739 Hyperglycemia, unspecified: Secondary | ICD-10-CM

## 2011-11-30 DIAGNOSIS — R7309 Other abnormal glucose: Secondary | ICD-10-CM

## 2011-11-30 LAB — LIPID PANEL
Cholesterol: 178 mg/dL (ref 0–200)
Triglycerides: 131 mg/dL (ref 0.0–149.0)

## 2011-11-30 LAB — CBC WITH DIFFERENTIAL/PLATELET
Basophils Absolute: 0 10*3/uL (ref 0.0–0.1)
Basophils Relative: 0.7 % (ref 0.0–3.0)
Eosinophils Absolute: 0.1 10*3/uL (ref 0.0–0.7)
Eosinophils Relative: 1.6 % (ref 0.0–5.0)
HCT: 40.5 % (ref 36.0–46.0)
Hemoglobin: 13.2 g/dL (ref 12.0–15.0)
Lymphocytes Relative: 24.9 % (ref 12.0–46.0)
Lymphs Abs: 1.5 10*3/uL (ref 0.7–4.0)
MCHC: 32.6 g/dL (ref 30.0–36.0)
MCV: 88.1 fl (ref 78.0–100.0)
Monocytes Absolute: 0.6 10*3/uL (ref 0.1–1.0)
Monocytes Relative: 10.3 % (ref 3.0–12.0)
Neutro Abs: 3.7 10*3/uL (ref 1.4–7.7)
Neutrophils Relative %: 62.5 % (ref 43.0–77.0)
Platelets: 370 10*3/uL (ref 150.0–400.0)
RBC: 4.6 Mil/uL (ref 3.87–5.11)
RDW: 12.4 % (ref 11.5–14.6)
WBC: 6 10*3/uL (ref 4.5–10.5)

## 2011-11-30 LAB — COMPREHENSIVE METABOLIC PANEL
Albumin: 4 g/dL (ref 3.5–5.2)
Alkaline Phosphatase: 56 U/L (ref 39–117)
BUN: 11 mg/dL (ref 6–23)
Calcium: 9.2 mg/dL (ref 8.4–10.5)
Chloride: 100 mEq/L (ref 96–112)
Glucose, Bld: 86 mg/dL (ref 70–99)
Potassium: 3.6 mEq/L (ref 3.5–5.1)

## 2011-11-30 LAB — HEMOGLOBIN A1C: Hgb A1c MFr Bld: 5.6 % (ref 4.6–6.5)

## 2011-12-04 LAB — VITAMIN D 1,25 DIHYDROXY
Vitamin D 1, 25 (OH)2 Total: 56 pg/mL (ref 18–72)
Vitamin D2 1, 25 (OH)2: <8 pg/mL
Vitamin D3 1, 25 (OH)2: 56 pg/mL

## 2011-12-06 ENCOUNTER — Encounter: Payer: Self-pay | Admitting: *Deleted

## 2011-12-23 ENCOUNTER — Other Ambulatory Visit: Payer: Self-pay | Admitting: Internal Medicine

## 2011-12-24 NOTE — Telephone Encounter (Signed)
Refill done.  

## 2012-03-30 ENCOUNTER — Telehealth: Payer: Self-pay | Admitting: Internal Medicine

## 2012-03-31 ENCOUNTER — Telehealth: Payer: Self-pay | Admitting: *Deleted

## 2012-03-31 NOTE — Telephone Encounter (Signed)
Left message on voice mail that script for Xanax was ready for pickup

## 2012-03-31 NOTE — Telephone Encounter (Signed)
Ok to refill? Last OV 10.15.13 Last filled 10.15.13

## 2012-03-31 NOTE — Telephone Encounter (Signed)
done

## 2012-04-02 ENCOUNTER — Telehealth: Payer: Self-pay | Admitting: *Deleted

## 2012-04-02 MED ORDER — ESCITALOPRAM OXALATE 10 MG PO TABS: 15.0000 mg | ORAL_TABLET | Freq: Every day | ORAL | Status: DC

## 2012-04-02 NOTE — Telephone Encounter (Signed)
Refill done.  

## 2012-04-02 NOTE — Telephone Encounter (Signed)
Patient called office states that she accidentally requested online refill through pharmacy and does not need this medication. Script destroyed.

## 2012-06-15 ENCOUNTER — Other Ambulatory Visit: Payer: Self-pay | Admitting: Internal Medicine

## 2012-06-16 NOTE — Telephone Encounter (Signed)
Refill done.  

## 2012-06-18 ENCOUNTER — Telehealth: Payer: Self-pay | Admitting: *Deleted

## 2012-06-18 MED ORDER — ESCITALOPRAM OXALATE 10 MG PO TABS: 15.0000 mg | ORAL_TABLET | Freq: Every day | ORAL | Status: DC

## 2012-06-18 NOTE — Telephone Encounter (Signed)
Refill done.  

## 2012-09-16 ENCOUNTER — Other Ambulatory Visit: Payer: Self-pay | Admitting: Internal Medicine

## 2012-09-17 NOTE — Telephone Encounter (Signed)
Clarified with Dr. Drue Novel only 30 tablets provided. Spoke with patient, apt. Scheduled.

## 2012-09-17 NOTE — Telephone Encounter (Signed)
Ok 30, needs OV

## 2012-09-17 NOTE — Telephone Encounter (Signed)
Refill for ventolin done per protocol.  Ok to refill Lexapro? Last OV 10.15.13 Last filled 5.7.14 #45 with no refills and requested OV scheduled.  Future OV scheduled for 10.28.14. Please advise.

## 2012-10-08 ENCOUNTER — Encounter: Payer: Self-pay | Admitting: Nurse Practitioner

## 2012-10-08 ENCOUNTER — Ambulatory Visit (INDEPENDENT_AMBULATORY_CARE_PROVIDER_SITE_OTHER): Payer: BC Managed Care – PPO | Admitting: Nurse Practitioner

## 2012-10-08 ENCOUNTER — Ambulatory Visit: Payer: BC Managed Care – PPO | Admitting: Internal Medicine

## 2012-10-08 VITALS — BP 144/80 | HR 75 | Temp 97.7°F | Ht 64.0 in | Wt 215.4 lb

## 2012-10-08 DIAGNOSIS — I10 Essential (primary) hypertension: Secondary | ICD-10-CM

## 2012-10-08 DIAGNOSIS — E559 Vitamin D deficiency, unspecified: Secondary | ICD-10-CM

## 2012-10-08 DIAGNOSIS — R635 Abnormal weight gain: Secondary | ICD-10-CM

## 2012-10-08 DIAGNOSIS — E785 Hyperlipidemia, unspecified: Secondary | ICD-10-CM

## 2012-10-08 DIAGNOSIS — F4321 Adjustment disorder with depressed mood: Secondary | ICD-10-CM

## 2012-10-08 DIAGNOSIS — J45909 Unspecified asthma, uncomplicated: Secondary | ICD-10-CM

## 2012-10-08 LAB — URINALYSIS, ROUTINE W REFLEX MICROSCOPIC
Hgb urine dipstick: NEGATIVE
Leukocytes, UA: NEGATIVE
Nitrite: NEGATIVE
Total Protein, Urine: NEGATIVE

## 2012-10-08 LAB — CBC WITH DIFFERENTIAL/PLATELET
Basophils Absolute: 0 10*3/uL (ref 0.0–0.1)
Eosinophils Absolute: 0.2 10*3/uL (ref 0.0–0.7)
Lymphocytes Relative: 24.9 % (ref 12.0–46.0)
MCHC: 33.2 g/dL (ref 30.0–36.0)
Neutrophils Relative %: 62.2 % (ref 43.0–77.0)
RDW: 14.3 % (ref 11.5–14.6)

## 2012-10-08 MED ORDER — ESCITALOPRAM OXALATE 10 MG PO TABS: 10.0000 mg | ORAL_TABLET | Freq: Every day | ORAL | Status: DC

## 2012-10-08 MED ORDER — MONTELUKAST SODIUM 10 MG PO TABS: 10.0000 mg | ORAL_TABLET | Freq: Every day | ORAL | Status: DC

## 2012-10-08 NOTE — Patient Instructions (Signed)
I have added singulair for asthma. I have referred you to psychiatry for eval of medications. I encourage you to take 1 or 2 10-15 minute walks daily, gradually adding 5 minutes as tolerated until you can walk for 30 minutes daily. Consider cutting back sugar as this help with reflux and weight loss and cholesterol. Keep appointment with Dr. Drue Novel in Oct. For physical. All the blood work will be done today. Pleasure to meet you!

## 2012-10-09 LAB — RENAL FUNCTION PANEL
CO2: 30 mEq/L (ref 19–32)
Calcium: 8.9 mg/dL (ref 8.4–10.5)
GFR: 84.77 mL/min (ref >60.00)
Glucose, Bld: 91 mg/dL (ref 70–99)
Potassium: 3.8 mEq/L (ref 3.5–5.1)
Sodium: 137 mEq/L (ref 135–145)

## 2012-10-09 LAB — LIPID PANEL
HDL: 49.4 mg/dL (ref >39.00)
LDL Cholesterol: 116 mg/dL — ABNORMAL HIGH (ref 0–99)
Total CHOL/HDL Ratio: 4
VLDL: 25.6 mg/dL (ref 0.0–40.0)

## 2012-10-09 LAB — TSH: TSH: 1.21 u[IU]/mL (ref 0.35–5.50)

## 2012-10-09 LAB — VITAMIN D 25 HYDROXY (VIT D DEFICIENCY, FRACTURES): Vit D, 25-Hydroxy: 58 ng/mL (ref 30–89)

## 2012-10-09 LAB — HEPATIC FUNCTION PANEL
Albumin: 4 g/dL (ref 3.5–5.2)
Alkaline Phosphatase: 59 U/L (ref 39–117)
Bilirubin, Direct: 0 mg/dL (ref 0.0–0.3)

## 2012-10-09 NOTE — Progress Notes (Signed)
Subjective:     Vanessa Middleton is a 57 y.o. female who presents for follow up of depression, asthma, Gerd and HTN.  Re depression: she has been taking lexapro for about 10 months following a difficult life event. She is seeing a therapist. She is working and caring for her 81 yo son, but continues to have difficult days and wonders if a medication change will help. She denies suicidal ideation, but is not motivated to care for herself through exercise or healthy diet. She has had 18 lb. weight gain since last visit 10 mos. ago. RE HTN:, her pressure is elevated today. She is taking 25 mg HCTZ. She is not exercising or restricting salt. RE asthma: She has had increase in DOE, she feels this is r/t weight gain. RE GERD: She has not had symptoms. Continues to take dexilant. In addition, she mentions she has had episodes of sweating not associated w/chest pain or nausea & wonders if she may be perimenopausal. She has been using mirena for 11-12 years. She has had some unusual spotting.    The following portions of the patient's history were reviewed and updated as appropriate: allergies, current medications, past medical history, past social history and problem list.  Review of Systems Constitutional: positive for weight gain, negative for chills, fevers and night sweats Ears, nose, mouth, throat, and face: negative for hoarseness and nasal congestion Respiratory: positive for asthma and dyspnea on exertion, negative for cough, pleurisy/chest pain, sputum and wheezing Cardiovascular: negative for chest pain, chest pressure/discomfort, fatigue, irregular heart beat, lower extremity edema and near-syncope Gastrointestinal: negative for abdominal pain, change in bowel habits, dyspepsia, nausea and reflux symptoms Integument/breast: positive for easy bruising Neurological: negative for headaches, seizures and weakness Behavioral/Psych: positive for depression, loss of interest in favorite activities  and weight gain, negative for aggressive behavior, anxiety, bad mood, decreased appetite, excessive alcohol consumption, illegal drug usage, sleep disturbance and tobacco use Allergic/Immunologic: negative for urticaria    Objective:    BP 144/80  Pulse 75  Temp(Src) 97.7 F (36.5 C) (Oral)  Ht 5\' 4"  (1.626 m)  Wt 215 lb 6.4 oz (97.705 kg)  BMI 36.96 kg/m2  SpO2 95%  LMP 05/01/2010  General:  alert, cooperative, appears stated age, no distress and moderately obese  Affect & Behavior:  full facial expressions, good grooming, good insight, normal perception, normal reasoning, normal speech pattern and content, normal thought patterns and has realistic expectation that antidepressants will not be "cure all" but needs to continue with seeing therapist. none    Skin: 1-2 1-2 cm ecchymotic lesions on arms with 2 mm scabs-pt states from her dog. Skin appears thin w/some loss of subQ fat.  Head: nontraumatic Eyes: lids & lashes clear, sclera clear, wearing glasses Extremities: no edema or varicose veins  Assessment:    1 Situational Depression, unchanged.  2 Asthma, exacerbated by weight gain secondary to depression.  3 GERD-symptoms controlled on dexilant.  4 HTN- 144/80-138/80 in ofc today 5 Sweating episodes may be r/t lexapro or perimenopausal.       Plan:    1 Continue to see therapist, ref to psych for eval of meds. Encourage taking a daily walk. 2 Start singulair, encourage 10-15 min. Daily walk as tol-increasing by 5 minutes as tolerated 3 Continue dexilant. Encourage weight loss 4  continue HCTZ. Continue to monitor, f/u in 3-4 mos 5 Discuss mirena with gynecology-may be time to d/c.

## 2012-11-12 ENCOUNTER — Other Ambulatory Visit: Payer: Self-pay | Admitting: *Deleted

## 2012-11-12 DIAGNOSIS — F4321 Adjustment disorder with depressed mood: Secondary | ICD-10-CM

## 2012-11-12 NOTE — Telephone Encounter (Signed)
Refill refused because patient needs OV.

## 2012-11-14 ENCOUNTER — Ambulatory Visit (HOSPITAL_COMMUNITY): Payer: BC Managed Care – PPO | Admitting: Psychiatry

## 2012-11-24 ENCOUNTER — Encounter: Payer: Self-pay | Admitting: Internal Medicine

## 2012-11-26 ENCOUNTER — Encounter: Payer: Self-pay | Admitting: Internal Medicine

## 2012-11-26 ENCOUNTER — Other Ambulatory Visit: Payer: Self-pay | Admitting: *Deleted

## 2012-11-26 DIAGNOSIS — F4321 Adjustment disorder with depressed mood: Secondary | ICD-10-CM

## 2012-11-26 MED ORDER — ESCITALOPRAM OXALATE 10 MG PO TABS: 10.0000 mg | ORAL_TABLET | Freq: Every day | ORAL | Status: DC

## 2012-11-26 NOTE — Telephone Encounter (Signed)
14 day supply of lexapro sent to pharmacy. Patient has OV scheduled for 12/09/2012.

## 2012-12-04 ENCOUNTER — Telehealth: Payer: Self-pay

## 2012-12-04 NOTE — Telephone Encounter (Signed)
Left message for call back Non identifiable   Colonoscopy 11-2009, 3 adenomatous polyps, q 3 years DUE Now Bone density --12/2008 WNL See gyn [facility] due--last 01/2011

## 2012-12-05 ENCOUNTER — Other Ambulatory Visit: Payer: Self-pay | Admitting: Internal Medicine

## 2012-12-05 NOTE — Telephone Encounter (Signed)
rx refilled per protocol. DJR  

## 2012-12-09 ENCOUNTER — Ambulatory Visit (INDEPENDENT_AMBULATORY_CARE_PROVIDER_SITE_OTHER): Payer: BC Managed Care – PPO | Admitting: Internal Medicine

## 2012-12-09 ENCOUNTER — Encounter: Payer: Self-pay | Admitting: Internal Medicine

## 2012-12-09 VITALS — BP 161/92 | HR 83 | Temp 98.6°F | Wt 220.4 lb

## 2012-12-09 DIAGNOSIS — K219 Gastro-esophageal reflux disease without esophagitis: Secondary | ICD-10-CM

## 2012-12-09 DIAGNOSIS — L259 Unspecified contact dermatitis, unspecified cause: Secondary | ICD-10-CM

## 2012-12-09 DIAGNOSIS — I10 Essential (primary) hypertension: Secondary | ICD-10-CM

## 2012-12-09 DIAGNOSIS — L309 Dermatitis, unspecified: Secondary | ICD-10-CM

## 2012-12-09 DIAGNOSIS — R7309 Other abnormal glucose: Secondary | ICD-10-CM

## 2012-12-09 DIAGNOSIS — Z0000 Encounter for general adult medical examination without abnormal findings: Secondary | ICD-10-CM

## 2012-12-09 DIAGNOSIS — Z23 Encounter for immunization: Secondary | ICD-10-CM

## 2012-12-09 DIAGNOSIS — F419 Anxiety disorder, unspecified: Secondary | ICD-10-CM

## 2012-12-09 DIAGNOSIS — F329 Major depressive disorder, single episode, unspecified: Secondary | ICD-10-CM

## 2012-12-09 DIAGNOSIS — J45909 Unspecified asthma, uncomplicated: Secondary | ICD-10-CM

## 2012-12-09 LAB — COMPREHENSIVE METABOLIC PANEL
AST: 24 U/L (ref 0–37)
Albumin: 4.2 g/dL (ref 3.5–5.2)
BUN: 15 mg/dL (ref 6–23)
Calcium: 9.2 mg/dL (ref 8.4–10.5)
Chloride: 98 mEq/L (ref 96–112)
Glucose, Bld: 114 mg/dL — ABNORMAL HIGH (ref 70–99)
Potassium: 3.6 mEq/L (ref 3.5–5.1)

## 2012-12-09 LAB — CBC WITH DIFFERENTIAL/PLATELET
Basophils Relative: 0.5 % (ref 0.0–3.0)
Eosinophils Absolute: 0.3 10*3/uL (ref 0.0–0.7)
Lymphocytes Relative: 22.9 % (ref 12.0–46.0)
MCHC: 33 g/dL (ref 30.0–36.0)
Neutrophils Relative %: 63.3 % (ref 43.0–77.0)
RBC: 4.91 Mil/uL (ref 3.87–5.11)
WBC: 8.3 10*3/uL (ref 4.5–10.5)

## 2012-12-09 MED ORDER — HYDROCHLOROTHIAZIDE 25 MG PO TABS: 25.0000 mg | ORAL_TABLET | Freq: Every day | ORAL | Status: DC

## 2012-12-09 MED ORDER — RANITIDINE HCL 300 MG PO TABS: 300.0000 mg | ORAL_TABLET | Freq: Every day | ORAL | Status: DC

## 2012-12-09 MED ORDER — MONTELUKAST SODIUM 10 MG PO TABS: 10.0000 mg | ORAL_TABLET | Freq: Every day | ORAL | Status: DC

## 2012-12-09 MED ORDER — DEXLANSOPRAZOLE 60 MG PO CPDR: DELAYED_RELEASE_CAPSULE | ORAL | Status: DC

## 2012-12-09 NOTE — Assessment & Plan Note (Signed)
On HCTZ, BP today elevated. BP at work few days ago 120/79. Plan: No change, ambulatory BPs

## 2012-12-09 NOTE — Assessment & Plan Note (Signed)
Td 2013 Flu shot at work [facility] shot today Colonoscopy 11-2009, 3 adenomatous polyps, next is due , already got a letter from Gi encouraged to proceed Bone density ----> 12-2008--normal  Female care per gyn, pending visit for this year Recent labs reviewed diet and exercise discussed

## 2012-12-09 NOTE — Patient Instructions (Signed)
  Dexilant  one tablet every morning before breakfast on an empty stomach Zantac at bedtime  Get your blood work before you leave  Next visit in 6 weeks for asthma and GERD  follow up .No Fasting Please make an appointment

## 2012-12-09 NOTE — Assessment & Plan Note (Signed)
Since the last time she was here she ran out of Lexapro and decoded not to restart. Still anxious, depressed but not as severely as w/ the onset ofsymptoms a year ago Plan: Continue seeing a therapist Patient is considerin see a  psychiatrist Will call if she needs me to restart ,meds  Continue with Xanax, UDS needed

## 2012-12-09 NOTE — Assessment & Plan Note (Signed)
Last A1c 6.1, diet and exercise discussed

## 2012-12-09 NOTE — Assessment & Plan Note (Addendum)
Symptoms not well-controlled, uses ventolin one or 2 times daily, also has symptoms at night. Symptoms likely increase d/t GERD Plan: GERD controlled, see below.

## 2012-12-09 NOTE — Addendum Note (Signed)
Addended by: Eustace Quail on: 12/09/2012 11:02 AM   Modules accepted: Orders

## 2012-12-09 NOTE — Assessment & Plan Note (Signed)
Not well controlled, on PPIs at night. Plan: PPIs every morning Zantac at night Return to the office 6 weeks Diet

## 2012-12-09 NOTE — Progress Notes (Signed)
  Subjective:    Patient ID: Vanessa Middleton, female    DOB: 09-18-1955, 57 y.o.   MRN: [id number]  HPI CPX  Past Medical History  Diagnosis Date  . Asthma   . GERD (gastroesophageal reflux disease)     EGD 10/11  . Hypertension   . Fatty liver     per [place] 9/11  . Personal history of colonic polyps 11/23/2007  . Anxiety and depression   . Anxiety and depression 10-2011   Past Surgical History  Procedure Laterality Date  . Breast biopsy  07/2007    neg  . Appendectomy  01/1997  . Cesarean section  03/1996  . Tonsillectomy    . Plantar fascitis      right foot  . Pilonidal cyst excision    . Colonoscopy    . Esophagogastroduodenoscopy     History   Social History  . Marital Status: Divorced    Spouse Name: N/A    Number of Children: 1   . Years of Education: N/A   Occupational History  . Customer Service [employer])    Social History Main Topics  . Smoking status: Former Smoker    Quit date: 02/13/1996  . Smokeless tobacco: Never Used     Comment: used to smoke 2 ppd  . Alcohol Use: Yes     Comment: 2-3 x/week  . Drug Use: No  . Sexual Activity: Not on file   Other Topics Concern  . Not on file   Social History Narrative   Lives w/ son   Family History  Problem Relation Age of Onset  . Melanoma Father   . Coronary artery disease Neg Hx   . Stroke Neg Hx   . Colon cancer Neg Hx   . Breast cancer Neg Hx   . Diabetes Other     cousin  . Hypertension Mother       Review of Systems In addition to her physical exam we discussed a number of other issues, see assessment and plan. Asthma not well controlled, recently Singulair was added, it helped little ,  she uses ventoloinn once or twice a day, usually at night. Symptoms increase since she gained weight and GERD symptoms increased. Takes PPIs at nighttime, still having classic heartburn 3 times a week, also hoarseness and constant need to clear her throat. BP today elevated, it was 120/80 few days  ago at work. Denies nausea, vomiting, diarrhea or blood in the stools.      Objective:   Physical Exam  BP 161/92  Pulse 83  Temp(Src) 98.6 F (37 C)  Wt 220 lb 6.4 oz (99.973 kg)  BMI 37.81 kg/m2  SpO2 95%  LMP 05/01/2010 General -- alert, well-developed, NAD.  Neck --no thyromegaly , normal carotid pulse Lungs -- normal respiratory effort, no intercostal retractions, no accessory muscle use, and normal breath sounds.  Heart-- normal rate, regular rhythm, no murmur.  Abdomen-- Not distended, good bowel sounds,soft, non-tender.  Extremities-- no pretibial edema bilaterally  Neurologic--  alert & oriented X3. Speech normal, gait normal, strength normal in all extremities.  Psych-- Cognition and judgment appear intact. Cooperative with normal attention span and concentration. No anxious appearing , no depressed appearing.      Assessment & Plan:

## 2012-12-09 NOTE — Assessment & Plan Note (Signed)
Started dapsone prescribed by Dr. Elmon Else Needs a CBC and CMP, will fax results to [phone]

## 2012-12-10 NOTE — Telephone Encounter (Signed)
Unable to reach prior to visit  

## 2012-12-11 ENCOUNTER — Encounter: Payer: Self-pay | Admitting: *Deleted

## 2013-01-12 ENCOUNTER — Other Ambulatory Visit: Payer: Self-pay | Admitting: Internal Medicine

## 2013-01-13 NOTE — Telephone Encounter (Signed)
rx refilled per protocol. DJR  

## 2013-01-22 ENCOUNTER — Ambulatory Visit (INDEPENDENT_AMBULATORY_CARE_PROVIDER_SITE_OTHER): Payer: BC Managed Care – PPO | Admitting: Internal Medicine

## 2013-01-22 ENCOUNTER — Encounter: Payer: Self-pay | Admitting: Internal Medicine

## 2013-01-22 VITALS — BP 133/83 | HR 80 | Temp 98.2°F | Wt 216.0 lb

## 2013-01-22 DIAGNOSIS — R7309 Other abnormal glucose: Secondary | ICD-10-CM

## 2013-01-22 DIAGNOSIS — K219 Gastro-esophageal reflux disease without esophagitis: Secondary | ICD-10-CM

## 2013-01-22 DIAGNOSIS — J45909 Unspecified asthma, uncomplicated: Secondary | ICD-10-CM

## 2013-01-22 DIAGNOSIS — F329 Major depressive disorder, single episode, unspecified: Secondary | ICD-10-CM

## 2013-01-22 DIAGNOSIS — F341 Dysthymic disorder: Secondary | ICD-10-CM

## 2013-01-22 DIAGNOSIS — R7303 Prediabetes: Secondary | ICD-10-CM

## 2013-01-22 NOTE — Patient Instructions (Addendum)
Go to the lab before you leave -------------> UDS Next visit for a   follow up   , no fasting, in 6 months Please make an appointment

## 2013-01-22 NOTE — Assessment & Plan Note (Signed)
Still having some symptoms, takes Xanax very rarely, she tolerate it well. We'll get a UDS, I' not opposed for her to take xanax more frequent, patient aware

## 2013-01-22 NOTE — Progress Notes (Signed)
   Subjective:    Patient ID: Vanessa Middleton, female    DOB: 1955-10-11, 57 y.o.   MRN: [id number]  HPI Followup from previous visit, GI symptoms are better control, that made a big difference to her asthma, currently using Ventolin once or maybe twice a week. Still having issues with anxiety, has an appointment pending with psychiatry. Taking Xanax which helps significantly very rarely. Does not get excessively sleepy.   Past Medical History  Diagnosis Date  . Asthma   . GERD (gastroesophageal reflux disease)     EGD 10/11  . Hypertension   . Fatty liver     per [place] 9/11  . Personal history of colonic polyps 11/23/2007  . Anxiety and depression   . Anxiety and depression 10-2011   Past Surgical History  Procedure Laterality Date  . Breast biopsy  07/2007    neg  . Appendectomy  01/1997  . Cesarean section  03/1996  . Tonsillectomy    . Plantar fascitis      right foot  . Pilonidal cyst excision    . Colonoscopy    . Esophagogastroduodenoscopy        History   Social History  . Marital Status: Divorced    Spouse Name: N/A    Number of Children: 1   . Years of Education: N/A   Occupational History  . Customer Service [employer])    Social History Main Topics  . Smoking status: Former Smoker    Quit date: 02/13/1996  . Smokeless tobacco: Never Used     Comment: used to smoke 2 ppd  . Alcohol Use: Yes     Comment: 2-3 x/week  . Drug Use: No  . Sexual Activity: Not on file   Other Topics Concern  . Not on file   Social History Narrative   Lives w/ son    Review of Systems  Denies chest shortness or breath. No nausea, vomiting,diarrhea    Objective:   Physical Exam BP 133/83  Pulse 80  Temp(Src) 98.2 F (36.8 C)  Wt 216 lb (97.977 kg)  SpO2 95%  LMP 05/01/2010 General -- alert, well-developed, NAD.  Lungs -- normal respiratory effort, no intercostal retractions, no accessory muscle use, and normal breath sounds.  Heart-- normal rate, regular  rhythm, no murmur.   Extremities-- no pretibial edema bilaterally  Neurologic--  alert & oriented X3.   Psych-- Cognition and judgment appear intact. Cooperative with normal attention span and concentration. No anxious appearing , no depressed appearing.      Assessment & Plan:

## 2013-01-22 NOTE — Assessment & Plan Note (Signed)
See previous entry, asthma has improved significantly, using albuterol twice a week on average

## 2013-01-22 NOTE — Assessment & Plan Note (Signed)
A1c is discussed, encourage a healthy diet and exercise

## 2013-01-22 NOTE — Assessment & Plan Note (Signed)
See previous entry, symptoms has improved significantly with PPIs in the morning and Zantac at night, continue with same medications

## 2013-01-23 ENCOUNTER — Other Ambulatory Visit: Payer: Self-pay | Admitting: Dermatology

## 2013-02-04 ENCOUNTER — Telehealth: Payer: Self-pay | Admitting: *Deleted

## 2013-02-04 NOTE — Telephone Encounter (Signed)
UDS collected on 01/22/13, patient had appropriate amounts of prescribed medications in system. Provider deemed low risk

## 2013-02-09 ENCOUNTER — Encounter: Payer: Self-pay | Admitting: Internal Medicine

## 2013-02-09 ENCOUNTER — Ambulatory Visit (INDEPENDENT_AMBULATORY_CARE_PROVIDER_SITE_OTHER): Payer: BC Managed Care – PPO | Admitting: Internal Medicine

## 2013-02-09 VITALS — BP 150/79 | HR 93 | Temp 99.2°F | Wt 210.0 lb

## 2013-02-09 DIAGNOSIS — L821 Other seborrheic keratosis: Secondary | ICD-10-CM

## 2013-02-09 DIAGNOSIS — R7309 Other abnormal glucose: Secondary | ICD-10-CM

## 2013-02-09 DIAGNOSIS — L538 Other specified erythematous conditions: Secondary | ICD-10-CM

## 2013-02-09 DIAGNOSIS — J209 Acute bronchitis, unspecified: Secondary | ICD-10-CM

## 2013-02-09 DIAGNOSIS — L920 Granuloma annulare: Secondary | ICD-10-CM

## 2013-02-09 DIAGNOSIS — J4531 Mild persistent asthma with (acute) exacerbation: Secondary | ICD-10-CM

## 2013-02-09 DIAGNOSIS — L851 Acquired keratosis [keratoderma] palmaris et plantaris: Secondary | ICD-10-CM

## 2013-02-09 DIAGNOSIS — R7303 Prediabetes: Secondary | ICD-10-CM

## 2013-02-09 DIAGNOSIS — J45901 Unspecified asthma with (acute) exacerbation: Secondary | ICD-10-CM

## 2013-02-09 LAB — CBC WITH DIFFERENTIAL/PLATELET
Basophils Absolute: 0 10*3/uL (ref 0.0–0.1)
Basophils Relative: 0.2 % (ref 0.0–3.0)
Eosinophils Relative: 1 % (ref 0.0–5.0)
HCT: 37 % (ref 36.0–46.0)
Lymphocytes Relative: 27.3 % (ref 12.0–46.0)
Lymphs Abs: 1.5 10*3/uL (ref 0.7–4.0)
Monocytes Absolute: 0.8 10*3/uL (ref 0.1–1.0)
Neutrophils Relative %: 58 % (ref 43.0–77.0)
Platelets: 294 10*3/uL (ref 150.0–400.0)
RBC: 4.31 Mil/uL (ref 3.87–5.11)
RDW: 14.4 % (ref 11.5–14.6)
WBC: 5.6 10*3/uL (ref 4.5–10.5)

## 2013-02-09 MED ORDER — AZITHROMYCIN 250 MG PO TABS: ORAL_TABLET | ORAL | Status: DC

## 2013-02-09 MED ORDER — PREDNISONE 20 MG PO TABS: 20.0000 mg | ORAL_TABLET | Freq: Two times a day (BID) | ORAL | Status: DC

## 2013-02-09 NOTE — Patient Instructions (Signed)
Carry room temperature water and sip liberally after coughing.Your next office appointment will be determined based upon review of your pending labs & response to meds. Those instructions will be transmitted to you through My Chart  or by mail if you're not using this system.   Followup as needed for your this acute issue. Please report any significant change in your symptoms.  Share results with Dr Emily Filbert

## 2013-02-09 NOTE — Progress Notes (Signed)
   Subjective:    Patient ID: Vanessa Middleton, female    DOB: [birth year], 56 y.o.   MRN: [id number]  HPI  Her symptoms began 02/04/13 as chest congestion. She was exposed  to her son who had been ill with a respiratory tract infection for approximately 48 hours. She had minor sneezing, low-grade fever, chills, sweats. She's produced some discolored sputum; 3-4 tsp/day. She also had associated shortness of breath and wheezing.  She has taken increased fluids, Mucinex DM, and the nonsteroidal agents with only partial response despite continuing Symbicort 2 puffs every 12 hours, albuterol up to every 3-4 hours, and generic Singulair.  She has not smoked since 1997. She has a past history of asthma and bronchitis.  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 CBC is overdue.She also was recently diagnosed as having lichenoid keratoses, pigmented.  She's been diagnosed with prediabetes; her A1c was 6.1% in August of this year        Review of Systems She specifically denies itchy, watery eyes. She also has no pelvic pain or discharge.  Absent are frontal headaches, facial pain, dental pain, sore throat, and purulent nasal secretions.       Objective:   Physical Exam General appearance:good health ;well nourished; no acute distress or increased work of breathing is present.  No  lymphadenopathy about the head, neck, or axilla noted.   Eyes: No conjunctival inflammation or lid edema is present.   Ears:  External ear exam shows no significant lesions or deformities.  Otoscopic examination reveals clear canals, tympanic membranes are intact bilaterally without bulging, retraction, inflammation or discharge.  Nose:  External nasal examination shows no deformity or inflammation. Nasal mucosa are pink and moist without lesions or exudates. No septal dislocation or deviation.No obstruction to airflow.   Oral exam: Dental hygiene is good; lips and gums are healthy  appearing.There is no oropharyngeal erythema or exudate noted.   Neck:  No deformities,  masses, or tenderness noted.      Heart:  Normal rate and regular rhythm. S1 and S2 normal without gallop, murmur, click, rub or other extra sounds.   Lungs: Exhibits diffuse, homogenous limited rales and wheezes in all lung fields.  Extremities:  No cyanosis, edema, or clubbing  noted    Skin: Warm & dry .Plaque lesion above OD         Assessment & Plan:  #1 acute bronchitis  #2 asthma exacerbation  #3 granuloma annulare  #4 prediabetes  Plan: See orders

## 2013-02-09 NOTE — Progress Notes (Signed)
Pre visit review using our clinic review tool, if applicable. No additional management support is needed unless otherwise documented below in the visit note. 

## 2013-02-10 ENCOUNTER — Encounter: Payer: Self-pay | Admitting: *Deleted

## 2013-02-15 ENCOUNTER — Other Ambulatory Visit: Payer: Self-pay | Admitting: Internal Medicine

## 2013-02-19 ENCOUNTER — Other Ambulatory Visit: Payer: Self-pay | Admitting: Internal Medicine

## 2013-02-19 ENCOUNTER — Telehealth: Payer: Self-pay | Admitting: *Deleted

## 2013-02-19 MED ORDER — ALPRAZOLAM 0.5 MG PO TABS: ORAL_TABLET | ORAL | Status: DC

## 2013-02-19 NOTE — Telephone Encounter (Signed)
rx refill- xanax 0.5mg  Last ov- 01/22/13 Last refilled- 03/20/12 #60 / 0 rf UDS contract - 12/26/12

## 2013-02-19 NOTE — Telephone Encounter (Signed)
Prescription printed. Please ask the person who worse for toxicology in our lab if the pt  had a UDS, if she did bring the report to me; if she didn't---->  obtain one.

## 2013-02-19 NOTE — Addendum Note (Signed)
Addended by: Kathlene November E on: 02/19/2013 01:15 PM   Modules accepted: Orders

## 2013-02-24 ENCOUNTER — Other Ambulatory Visit: Payer: Self-pay | Admitting: *Deleted

## 2013-02-25 ENCOUNTER — Telehealth: Payer: Self-pay | Admitting: *Deleted

## 2013-02-25 NOTE — Telephone Encounter (Signed)
PA paperwork for [place] faxed to insurance company. Awaiting response. JG//CMA

## 2013-03-03 ENCOUNTER — Other Ambulatory Visit: Payer: Self-pay | Admitting: *Deleted

## 2013-03-03 MED ORDER — OMEPRAZOLE 40 MG PO CPDR: 40.0000 mg | DELAYED_RELEASE_CAPSULE | ORAL | Status: DC

## 2013-03-03 NOTE — Telephone Encounter (Signed)
Called and informed patient that Dexliant was not approved. She agreed to start omeprazole. E-scribed med to pharmacy. JG/CMA

## 2013-03-03 NOTE — Telephone Encounter (Signed)
Dexilant PA was denied. [employer] suggested alternatives which are esomeprazole, lansoprazole, omeprazole, pantoprazole and raveprazole. Please advise.

## 2013-03-03 NOTE — Telephone Encounter (Signed)
If patient agreeable, recommend omeprazole 40 mg one by mouth every morning

## 2013-03-04 ENCOUNTER — Encounter: Payer: Self-pay | Admitting: Internal Medicine

## 2013-03-26 ENCOUNTER — Encounter: Payer: Self-pay | Admitting: Internal Medicine

## 2013-05-08 ENCOUNTER — Encounter: Payer: Self-pay | Admitting: Internal Medicine

## 2013-05-15 ENCOUNTER — Other Ambulatory Visit: Payer: Self-pay | Admitting: Internal Medicine

## 2013-05-25 ENCOUNTER — Other Ambulatory Visit: Payer: Self-pay | Admitting: *Deleted

## 2013-05-25 MED ORDER — ALBUTEROL SULFATE HFA 108 (90 BASE) MCG/ACT IN AERS: INHALATION_SPRAY | RESPIRATORY_TRACT | Status: DC

## 2013-05-31 ENCOUNTER — Other Ambulatory Visit: Payer: Self-pay | Admitting: Internal Medicine

## 2013-06-01 ENCOUNTER — Other Ambulatory Visit: Payer: Self-pay | Admitting: Internal Medicine

## 2013-06-15 ENCOUNTER — Other Ambulatory Visit: Payer: Self-pay | Admitting: Internal Medicine

## 2013-06-28 ENCOUNTER — Other Ambulatory Visit: Payer: Self-pay | Admitting: Internal Medicine

## 2013-07-17 ENCOUNTER — Other Ambulatory Visit: Payer: Self-pay | Admitting: Internal Medicine

## 2013-07-23 ENCOUNTER — Ambulatory Visit (INDEPENDENT_AMBULATORY_CARE_PROVIDER_SITE_OTHER): Payer: BC Managed Care – PPO | Admitting: Internal Medicine

## 2013-07-23 ENCOUNTER — Encounter: Payer: Self-pay | Admitting: Internal Medicine

## 2013-07-23 VITALS — BP 143/83 | HR 90 | Temp 98.0°F | Wt 218.0 lb

## 2013-07-23 DIAGNOSIS — R7303 Prediabetes: Secondary | ICD-10-CM

## 2013-07-23 DIAGNOSIS — F32A Depression, unspecified: Secondary | ICD-10-CM

## 2013-07-23 DIAGNOSIS — I10 Essential (primary) hypertension: Secondary | ICD-10-CM

## 2013-07-23 DIAGNOSIS — F329 Major depressive disorder, single episode, unspecified: Secondary | ICD-10-CM

## 2013-07-23 DIAGNOSIS — K219 Gastro-esophageal reflux disease without esophagitis: Secondary | ICD-10-CM

## 2013-07-23 DIAGNOSIS — F341 Dysthymic disorder: Secondary | ICD-10-CM

## 2013-07-23 DIAGNOSIS — R7309 Other abnormal glucose: Secondary | ICD-10-CM

## 2013-07-23 DIAGNOSIS — F419 Anxiety disorder, unspecified: Secondary | ICD-10-CM

## 2013-07-23 LAB — BASIC METABOLIC PANEL
BUN: 15 mg/dL (ref 6–23)
CHLORIDE: 103 meq/L (ref 96–112)
CO2: 26 meq/L (ref 19–32)
Calcium: 8.8 mg/dL (ref 8.4–10.5)
Creatinine, Ser: 0.8 mg/dL (ref 0.4–1.2)
GFR: 78.47 mL/min (ref >60.00)
GLUCOSE: 107 mg/dL — AB (ref 70–99)
POTASSIUM: 3.6 meq/L (ref 3.5–5.1)
SODIUM: 137 meq/L (ref 135–145)

## 2013-07-23 LAB — HEMOGLOBIN A1C: Hgb A1c MFr Bld: 6.3 % (ref 4.6–6.5)

## 2013-07-23 NOTE — Assessment & Plan Note (Signed)
Symptoms improved by taking PPIs on a empty stomach

## 2013-07-23 NOTE — Assessment & Plan Note (Signed)
Currently on sertraline, doing better. Very rarely uses Xanax. UDS low risk 01/2013

## 2013-07-23 NOTE — Progress Notes (Signed)
Pre visit review using our clinic review tool, if applicable. No additional management support is needed unless otherwise documented below in the visit note. 

## 2013-07-23 NOTE — Assessment & Plan Note (Signed)
Chart and pertinent labs reviewed  Check a BMP, no change

## 2013-07-23 NOTE — Assessment & Plan Note (Addendum)
Chart and pertinent labs reviewed  Due for a A1c. Diet and exercise discussed

## 2013-07-23 NOTE — Progress Notes (Signed)
Subjective:    Patient ID: Vanessa Middleton, female    DOB: 1955-06-12, 58 y.o.   MRN: [id number]  DOS:  07/23/2013 Type of  Visit: ROV History: In general feeling well, good compliance with medications. GERD has improved by taking PPIs before meals. Very rarely uses albuterol, maybe one or 2 times a week. She is now on sertraline which is helping her symptoms. Not using Xanax but rarely   ROS Denies chest pain, difficulty breathing, nausea, vomiting, diarrhea  Past Medical History  Diagnosis Date  . Asthma   . GERD (gastroesophageal reflux disease)     EGD 10/11  . Hypertension   . Fatty liver     per [place] 9/11  . Personal history of colonic polyps 11/23/2007  . Anxiety and depression   . Anxiety and depression 10-2011    Past Surgical History  Procedure Laterality Date  . Breast biopsy  07/2007    neg  . Appendectomy  01/1997  . Cesarean section  03/1996  . Tonsillectomy    . Plantar fascitis      right foot  . Pilonidal cyst excision    . Colonoscopy    . Esophagogastroduodenoscopy      History   Social History  . Marital Status: Divorced    Spouse Name: N/A    Number of Children: 1   . Years of Education: N/A   Occupational History  . Customer Service [employer])    Social History Main Topics  . Smoking status: Former Smoker    Quit date: 02/13/1996  . Smokeless tobacco: Never Used     Comment: used to smoke 2 ppd  . Alcohol Use: Yes     Comment: 2-3 x/week  . Drug Use: No  . Sexual Activity: Not on file   Other Topics Concern  . Not on file   Social History Narrative   Lives w/ son        Medication List       This list is accurate as of: 07/23/13  2:09 PM.  Always use your most recent med list.               albuterol 108 (90 BASE) MCG/ACT inhaler  Commonly known as:  VENTOLIN HFA  INHALE 2 PUFFS BY MOUTH EVERY 6 HOURS AS NEEDED FOR COUGH OR WHEEZING     ALPRAZolam 0.5 MG tablet  Commonly known as:  XANAX  TAKE 1 TABLET BY  MOUTH THREE TIMES DAILY AS NEEDED     cholecalciferol 1000 UNITS tablet  Commonly known as:  VITAMIN D  Take 2,000 Units by mouth daily.     hydrochlorothiazide 25 MG tablet  Commonly known as:  HYDRODIURIL  TAKE 1 TABLET BY MOUTH EVERY DAY     levonorgestrel 20 MCG/24HR IUD  Commonly known as:  MIRENA  1 each by Intrauterine route once.     montelukast 10 MG tablet  Commonly known as:  SINGULAIR  TAKE 1 TABLET BY MOUTH EVERY NIGHT AT BEDTIME     omeprazole 40 MG capsule  Commonly known as:  PRILOSEC  Take 1 capsule (40 mg total) by mouth every morning.     ranitidine 300 MG tablet  Commonly known as:  ZANTAC  TAKE 1 TABLET BY MOUTH EVERY NIGHT AT BEDTIME     sertraline 100 MG tablet  Commonly known as:  ZOLOFT     SYMBICORT 160-4.5 MCG/ACT inhaler  Generic drug:  budesonide-formoterol  INHALE 2 PUFFS TWICE  DAILY           Objective:   Physical Exam BP 143/83  Pulse 90  Temp(Src) 98 F (36.7 C)  Wt 218 lb (98.884 kg)  SpO2 95%  LMP 05/01/2010  General -- alert, well-developed, NAD.  Lungs -- normal respiratory effort, no intercostal retractions, no accessory muscle use, and normal breath sounds.  Heart-- normal rate, regular rhythm, no murmur.   Extremities-- no pretibial edema bilaterally  Neurologic--  alert & oriented X3.  Psych-- Cognition and judgment appear intact. Cooperative with normal attention span and concentration. No anxious or depressed appearing.      Assessment & Plan:

## 2013-07-23 NOTE — Patient Instructions (Signed)
Get your blood work before you leave   Next visit is for a physical exam by 11-2013 ,  fasting Please make an appointment    

## 2013-07-24 ENCOUNTER — Telehealth: Payer: Self-pay | Admitting: Internal Medicine

## 2013-07-24 NOTE — Telephone Encounter (Signed)
Relevant patient education assigned to patient using Emmi. ° °

## 2013-08-23 ENCOUNTER — Other Ambulatory Visit: Payer: Self-pay | Admitting: Internal Medicine

## 2013-09-19 ENCOUNTER — Other Ambulatory Visit: Payer: Self-pay | Admitting: Internal Medicine

## 2013-09-30 ENCOUNTER — Other Ambulatory Visit: Payer: Self-pay | Admitting: Internal Medicine

## 2013-10-01 ENCOUNTER — Other Ambulatory Visit: Payer: Self-pay | Admitting: Internal Medicine

## 2013-11-11 ENCOUNTER — Encounter: Payer: Self-pay | Admitting: Internal Medicine

## 2013-11-11 ENCOUNTER — Ambulatory Visit (INDEPENDENT_AMBULATORY_CARE_PROVIDER_SITE_OTHER): Payer: BC Managed Care – PPO | Admitting: Internal Medicine

## 2013-11-11 VITALS — BP 165/89 | HR 79 | Temp 98.8°F | Wt 225.2 lb

## 2013-11-11 DIAGNOSIS — I10 Essential (primary) hypertension: Secondary | ICD-10-CM

## 2013-11-11 DIAGNOSIS — J45909 Unspecified asthma, uncomplicated: Secondary | ICD-10-CM

## 2013-11-11 DIAGNOSIS — R7309 Other abnormal glucose: Secondary | ICD-10-CM

## 2013-11-11 DIAGNOSIS — R42 Dizziness and giddiness: Secondary | ICD-10-CM

## 2013-11-11 DIAGNOSIS — R7303 Prediabetes: Secondary | ICD-10-CM

## 2013-11-11 LAB — VITAMIN B12: Vitamin B-12: 316 pg/mL (ref 211–911)

## 2013-11-11 LAB — FOLATE: FOLATE: 16.7 ng/mL (ref >5.9)

## 2013-11-11 LAB — HEMOGLOBIN A1C: HEMOGLOBIN A1C: 6.2 % (ref 4.6–6.5)

## 2013-11-11 NOTE — Progress Notes (Signed)
Subjective:    Patient ID: Vanessa Middleton, female    DOB: 1955/12/23, 58 y.o.   MRN: [id number]  DOS:  11/11/2013 Type of visit - description : Acute, has a number of concerns Interval history: L Ankle pain x2 weeks after she twisted ankle and fell, no other injuries, pain and swelling have decreased  Insect bite At left foot ~ 2 weeks ago, better   BP today is elevated, no ambulatory BPs, good compliance of medication  Nasal congestion  For the last 2 days, has  occasional cough with green sputum production. no fever chills, she has a history of asthma, has used her inhaler 3 times in the last 2 days which is  Unusual.  Dizziness for at least 6 months. She gets "off balance" whenever she closed her eyes. Denies any dizziness with turning in bed, turning her head or standing. No nausea associated with her symptoms.    ROS Denies any headaches, diplopia, slurred speech or motor deficits in the last year.  Past Medical History  Diagnosis Date  . Asthma   . GERD (gastroesophageal reflux disease)     EGD 10/11  . Hypertension   . Fatty liver     per [place] 9/11  . Personal history of colonic polyps 11/23/2007  . Anxiety and depression   . Anxiety and depression 10-2011    Past Surgical History  Procedure Laterality Date  . Breast biopsy  07/2007    neg  . Appendectomy  01/1997  . Cesarean section  03/1996  . Tonsillectomy    . Plantar fascitis      right foot  . Pilonidal cyst excision    . Colonoscopy    . Esophagogastroduodenoscopy      History   Social History  . Marital Status: Divorced    Spouse Name: N/A    Number of Children: 1   . Years of Education: N/A   Occupational History  . [employer] Service [employer])    Social History Main Topics  . Smoking status: Former Smoker    Quit date: 02/13/1996  . Smokeless tobacco: Never Used     Comment: used to smoke 2 ppd  . Alcohol Use: Yes     Comment: 2-3 x/week  . Drug Use: No  . Sexual Activity: Not on  file   Other Topics Concern  . Not on file   Social History Narrative   Lives w/ son        Medication List       This list is accurate as of: 11/11/13  2:05 PM.  Always use your most recent med list.               albuterol 108 (90 BASE) MCG/ACT inhaler  Commonly known as:  VENTOLIN HFA  INHALE 2 PUFFS BY MOUTH EVERY 6 HOURS AS NEEDED FOR COUGH OR WHEEZING     ALPRAZolam 0.5 MG tablet  Commonly known as:  XANAX  TAKE 1 TABLET BY MOUTH THREE TIMES DAILY AS NEEDED     cholecalciferol 1000 UNITS tablet  Commonly known as:  VITAMIN D  Take 2,000 Units by mouth daily.     hydrochlorothiazide 25 MG tablet  Commonly known as:  HYDRODIURIL  TAKE 1 TABLET BY MOUTH DAILY     levonorgestrel 20 MCG/24HR IUD  Commonly known as:  MIRENA  1 each by Intrauterine route once.     montelukast 10 MG tablet  Commonly known as:  SINGULAIR  TAKE  1 TABLET BY MOUTH EVERY NIGHT AT BEDTIME     omeprazole 40 MG capsule  Commonly known as:  PRILOSEC  TAKE ONE CAPSULE BY MOUTH EVERY MORNING     ranitidine 300 MG tablet  Commonly known as:  ZANTAC  TAKE 1 TABLET BY MOUTH EVERY NIGHT AT BEDTIME     sertraline 100 MG tablet  Commonly known as:  ZOLOFT     SYMBICORT 160-4.5 MCG/ACT inhaler  Generic drug:  budesonide-formoterol  INHALE 2 PUFFS BY MOUTH TWICE DAILY           Objective:   Physical Exam BP 165/89  Pulse 79  Temp(Src) 98.8 F (37.1 C) (Oral)  Wt 225 lb 4 oz (102.173 kg)  SpO2 95%  LMP 05/01/2010 General -- alert, well-developed, NAD.  HEENT-- Not pale.  R Ear-- normal L ear-- normal Throat symmetric, no redness or discharge. Face symmetric, sinuses not tender to palpation. Nose   congested.  Lungs -- normal respiratory effort, no intercostal retractions, no accessory muscle use, and ronchi-wheezes that clear w/  cough Heart-- normal rate, regular rhythm, no murmur.   Extremities-- no pretibial edema bilaterally , ankles symmetric , normal to inspection and  palpation, L slt painful w/ ROM Neurologic--  alert & oriented X3. Speech normal, unable to do tandem gait ,strength symmetric and appropriate for age.  DTRs symmetric. EOMI, PERLA   pinpric exam feet normal except around the L great and second toe Romberg seems present Psych-- Cognition and judgment appear intact. Cooperative with normal attention span and concentration. No anxious or depressed appearing.      Assessment & Plan:  Ankle sprain improving, recommend ace wrap or an ankle brace.

## 2013-11-11 NOTE — Progress Notes (Signed)
Pre visit review using our clinic review tool, if applicable. No additional management support is needed unless otherwise documented below in the visit note. 

## 2013-11-11 NOTE — Assessment & Plan Note (Signed)
Well-controlled?. Recent BMP okay Plan: Continue with present care and check ambulatory BPs

## 2013-11-11 NOTE — Assessment & Plan Note (Signed)
Not doing well with diet and exercise, encouraged to do so, check  A1c

## 2013-11-11 NOTE — Patient Instructions (Signed)
Get your blood work before you leave   Rest, fluids , tylenol For cough, take Mucinex DM twice a day as needed  If  congestion use OTC Nasocort: 2 nasal sprays on each side of the nose daily until you feel better Albuterol as needed  Call if not gradually better over the next 3-4 days  Check the  blood pressure 2 or 3 times a  week be sure it is between 110/60 and 140/85. Ideal blood pressure is 120/80. If it is consistently higher or lower, let me know   Please come back to the office next months  for a physical exam. Come back fasting

## 2013-11-11 NOTE — Assessment & Plan Note (Signed)
I am somehow concerned about the dizziness w/ a  + Romberg. Plan: Y80, folic acid, neuro referral

## 2013-11-11 NOTE — Assessment & Plan Note (Signed)
Having a URI for the last 2 days, mild exacerbation. Plan: Rest, fluids, Mucinex albuterol as needed and call if not better

## 2013-11-19 ENCOUNTER — Other Ambulatory Visit: Payer: Self-pay | Admitting: Internal Medicine

## 2013-12-14 ENCOUNTER — Telehealth: Payer: Self-pay | Admitting: Neurology

## 2013-12-14 ENCOUNTER — Ambulatory Visit (INDEPENDENT_AMBULATORY_CARE_PROVIDER_SITE_OTHER): Payer: BC Managed Care – PPO | Admitting: Neurology

## 2013-12-14 ENCOUNTER — Encounter: Payer: Self-pay | Admitting: Neurology

## 2013-12-14 ENCOUNTER — Telehealth: Payer: Self-pay | Admitting: Family Medicine

## 2013-12-14 VITALS — BP 130/80 | HR 84 | Resp 18 | Ht 64.0 in | Wt 227.0 lb

## 2013-12-14 DIAGNOSIS — G629 Polyneuropathy, unspecified: Secondary | ICD-10-CM

## 2013-12-14 DIAGNOSIS — R292 Abnormal reflex: Secondary | ICD-10-CM

## 2013-12-14 DIAGNOSIS — R2681 Unsteadiness on feet: Secondary | ICD-10-CM

## 2013-12-14 LAB — TSH: TSH: 2.005 u[IU]/mL (ref 0.350–4.500)

## 2013-12-14 LAB — RHEUMATOID FACTOR: Rhuematoid fact SerPl-aCnc: <10 IU/mL (ref <=14)

## 2013-12-14 NOTE — Patient Instructions (Addendum)
1. Schedule open MRI brain without contrast, MRI cervical spine without contrast 2. Bloodwork for TSH, 2-hour glucose tolerance test, SPEP/IFE, ESR, ANA, RF, SS-A, SS-B, RPR, vitamin B6, copper 3. Schedule EMG/NCV of both LE with Dr. Posey Pronto 4. Start physical therapy for balance therapy 5. Use cane for balance 6. Reduce alcohol intake

## 2013-12-14 NOTE — Progress Notes (Signed)
NEUROLOGY CONSULTATION NOTE  Vanessa Middleton MRN: [id number] DOB: [birth year]  Referring provider: Dr. Kathlene November Primary care provider: Dr. Kathlene November  Reason for consult:  dizziness  Dear Dr Larose Kells:  Thank you for your kind referral of Vanessa Middleton for consultation of the above symptoms. Although her history is well known to you, please allow me to reiterate it for the purpose of our medical record. Records and images were personally reviewed where available.  HISTORY OF PRESENT ILLNESS: This is a very pleasant 58 year old right-handed woman with a history of hypertension, depression, and anxiety, presenting for evaluation of balance problems that started around 6 months ago. She first noticed that she could not stand still with her eyes closed. She has fallen 10 times in the past 6 months, usually she stumbles or her ankle or knee just gives out. She had twisted her left ankle recently. She has fallen several times when she gets up in the middle of the night to go to the bathroom.  She has intermittent numbness and tingling in both feet, usually taking her a minute to make sure her feet are there when she first stands up. Both feet feel sore, she feels like she cannot walk as fast as she used to.  She has occasional numbness and tingling in both hands. She denies any loss of consciousness with the falls. She does not usually have headaches, but recently has been having a few more with mild pressure pain in the back of her head, lasting a few hours, no nausea/vomiting/photo or phonophobia. Ibuprofen usually helps. She denies any true vertigo, no diplopia, dysarthria, dysphagia, bowel/bladder dysfunction. She has occasional mild neck and low back pain. There is no family history of similar symptoms.  For several years she has been drinking a couple of glasses of wine or beer daily.  She denies any significant head or neck injury, no car accidents in the past.  Laboratory Data: Lab Results    Component Value Date   TSH 1.21 10/08/2012   Lab Results  Component Value Date   VITAMINB12 316 11/11/2013   Lab Results  Component Value Date   HGBA1C 6.2 11/11/2013      Chemistry      Component Value Date/Time   NA 137 07/23/2013 0827   K 3.6 07/23/2013 0827   CL 103 07/23/2013 0827   CO2 26 07/23/2013 0827   BUN 15 07/23/2013 0827   CREATININE 0.8 07/23/2013 0827      Component Value Date/Time   CALCIUM 8.8 07/23/2013 0827   ALKPHOS 66 12/09/2012 0919   AST 24 12/09/2012 0919   ALT 26 12/09/2012 0919   BILITOT 0.6 12/09/2012 0919       PAST MEDICAL HISTORY: Past Medical History  Diagnosis Date  . Asthma   . GERD (gastroesophageal reflux disease)     EGD 10/11  . Hypertension   . Fatty liver     per [place] 9/11  . Personal history of colonic polyps 11/23/2007  . Anxiety and depression   . Anxiety and depression 10-2011    PAST SURGICAL HISTORY: Past Surgical History  Procedure Laterality Date  . Breast biopsy  07/2007    neg  . Appendectomy  01/1997  . Cesarean section  03/1996  . Tonsillectomy    . Plantar fascitis      right foot  . Pilonidal cyst excision    . Colonoscopy    . Esophagogastroduodenoscopy      MEDICATIONS:  Current Outpatient Prescriptions on File Prior to Visit  Medication Sig Dispense Refill  . albuterol (VENTOLIN HFA) 108 (90 BASE) MCG/ACT inhaler INHALE 2 PUFFS BY MOUTH EVERY 6 HOURS AS NEEDED FOR COUGH OR WHEEZING 18 g 1  . ALPRAZolam (XANAX) 0.5 MG tablet TAKE 1 TABLET BY MOUTH THREE TIMES DAILY AS NEEDED 60 tablet 0  . cholecalciferol (VITAMIN D) 1000 UNITS tablet Take 2,000 Units by mouth daily.      . hydrochlorothiazide (HYDRODIURIL) 25 MG tablet TAKE 1 TABLET BY MOUTH DAILY 90 tablet 1  . levonorgestrel (MIRENA) 20 MCG/24HR IUD 1 each by Intrauterine route once.    . montelukast (SINGULAIR) 10 MG tablet TAKE 1 TABLET BY MOUTH EVERY NIGHT AT BEDTIME 90 tablet 0  . omeprazole (PRILOSEC) 40 MG capsule TAKE ONE CAPSULE BY  MOUTH EVERY MORNING 90 capsule 1  . ranitidine (ZANTAC) 300 MG tablet TAKE 1 TABLET BY MOUTH EVERY NIGHT AT BEDTIME 30 tablet 6  . sertraline (ZOLOFT) 100 MG tablet     . SYMBICORT 160-4.5 MCG/ACT inhaler INHALE 2 PUFFS BY MOUTH TWICE DAILY 10.2 g 2   No current facility-administered medications on file prior to visit.    ALLERGIES: No Known Allergies  FAMILY HISTORY: Family History  Problem Relation Age of Onset  . Melanoma Father   . Coronary artery disease Neg Hx   . Stroke Neg Hx   . Colon cancer Neg Hx   . Breast cancer Neg Hx   . Diabetes Other     cousin  . Hypertension Mother     SOCIAL HISTORY: History   Social History  . Marital Status: Divorced    Spouse Name: N/A    Number of Children: 1   . Years of Education: N/A   Occupational History  . Customer Service [employer])    Social History Main Topics  . Smoking status: Former Smoker    Quit date: 02/13/1996  . Smokeless tobacco: Never Used     Comment: used to smoke 2 ppd  . Alcohol Use: 0.0 oz/week    0 Not specified per week     Comment: 2-3 x/week  . Drug Use: No  . Sexual Activity: Not on file   Other Topics Concern  . Not on file   Social History Narrative   Lives w/ son    REVIEW OF SYSTEMS: Constitutional: No fevers, chills, or sweats, no generalized fatigue, change in appetite Eyes: No visual changes, double vision, eye pain Ear, nose and throat: No hearing loss, ear pain, nasal congestion, sore throat Cardiovascular: No chest pain, palpitations Respiratory:  No shortness of breath at rest or with exertion, wheezes GastrointestinaI: No nausea, vomiting, diarrhea, abdominal pain, fecal incontinence Genitourinary:  No dysuria, urinary retention or frequency Musculoskeletal:  +occasional neck pain, back pain Integumentary: No rash, pruritus, skin lesions Neurological: as above Psychiatric: No depression, insomnia, anxiety Endocrine: No palpitations, fatigue, diaphoresis, mood swings,  change in appetite, change in weight, increased thirst Hematologic/Lymphatic:  No anemia, purpura, petechiae. Allergic/Immunologic: no itchy/runny eyes, nasal congestion, recent allergic reactions, rashes  PHYSICAL EXAM: Filed Vitals:   12/14/13 0822  BP: 130/80  Pulse: 84  Resp: 18   General: No acute distress Head:  Normocephalic/atraumatic Eyes: Fundoscopic exam shows bilateral sharp discs, no vessel changes, exudates, or hemorrhages Neck: supple, no paraspinal tenderness, full range of motion Back: No paraspinal tenderness Heart: regular rate and rhythm Lungs: Clear to auscultation bilaterally. Vascular: No carotid bruits. Skin/Extremities: No rash, no edema Neurological Exam:  Mental status: alert and oriented to person, place, and time, no dysarthria or aphasia, Fund of knowledge is appropriate.  Recent and remote memory are intact.  Attention and concentration are normal.    Able to name objects and repeat phrases. Cranial nerves: CN I: not tested CN II: pupils equal, round and reactive to light, visual fields intact, fundi unremarkable. CN III, IV, VI:  full range of motion, no nystagmus, no ptosis CN V: facial sensation intact CN VII: upper and lower face symmetric CN VIII: hearing intact to finger rub CN IX, X: gag intact, uvula midline CN XI: sternocleidomastoid and trapezius muscles intact CN XII: tongue midline Bulk & Tone: normal, no fasciculations. Motor: 5/5 throughout with no pronator drift. Sensation: decreased pin on the right calf, decreased vibration sense up to right hip, left knee. Intact temperature and joint position sense. Intact to all modalities on both UE. Romberg test positive  Deep Tendon Reflexes: brisk + 3 throughout with crossed adductor reflexes. Negative Hoffman sign, no ankle clonus Plantar responses: upgoing bilaterally Cerebellar: no incoordination on finger to nose, heel to shin. No dysdiadochokinesia Gait: slow and cautious, slightly  ataxic, unable to tandem walk Tremor: none  IMPRESSION: This is a pleasant 58 year old right-handed woman with a history of hypertension, depression, anxiety, presenting with gait and balance difficulties over the past 6 months. 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 MRI brain and cervical spine without contrast will be ordered to assess for underlying structural abnormality. Bloodwork for treatable causes of peripheral neuropathy will be ordered, as well as an EMG/NCV of the lower extremities to futher evaluate her symptoms. We discussed alcohol cessation, as this can also cause neuropathy. She will be referred to PT for balance therapy and will start using a cane for balance. We discussed fall precautions. She will follow-up after the tests.   Thank you for allowing me to participate in the care of this patient. Please do not hesitate to call for any questions or concerns.   Ellouise Newer, M.D.  CC: Dr. Larose Kells

## 2013-12-14 NOTE — Telephone Encounter (Signed)
Called patient to give appt information for MRI Brain & Cervical Spine w/o Contrast.   She has been scheduled @ [facility] [address]  12/20/13 arrival @ 6:55 pm. Advised to call [facility] @ [phone] if the date/time doesn't work for her.  All information left via voicemail.

## 2013-12-14 NOTE — Telephone Encounter (Signed)
Called patient. She just wanted to double check and make sure she wasn't supposed to be fasting for other labs that she had done today. I did explain to her that it was ok for her to go ahead and have her bloodwork done. The hr glucose test is the only test that she should have fasting instructions from the lab.

## 2013-12-14 NOTE — Telephone Encounter (Signed)
[phone] can leave a message for pat it is about the orders for blood work she needs to know if she needs anything done about fasting

## 2013-12-15 LAB — RPR

## 2013-12-15 LAB — SJOGREN'S SYNDROME ANTIBODS(SSA + SSB)
SSA (RO) (ENA) ANTIBODY, IGG: NEGATIVE
SSB (La) (ENA) Antibody, IgG: <1

## 2013-12-15 LAB — ANA: Anti Nuclear Antibody(ANA): NEGATIVE

## 2013-12-15 LAB — SEDIMENTATION RATE: SED RATE: 1 mm/h (ref 0–22)

## 2013-12-16 LAB — IMMUNOFIXATION ELECTROPHORESIS
IGG (IMMUNOGLOBIN G), SERUM: 693 mg/dL (ref 690–1700)
IgA: 136 mg/dL (ref 69–380)
IgM, Serum: 133 mg/dL (ref 52–322)
Total Protein, Serum Electrophoresis: 6.9 g/dL (ref 6.0–8.3)

## 2013-12-16 LAB — PROTEIN ELECTROPHORESIS, SERUM
ALBUMIN ELP: 61.7 % (ref 55.8–66.1)
ALPHA-2-GLOBULIN: 9.9 % (ref 7.1–11.8)
Alpha-1-Globulin: 4.6 % (ref 2.9–4.9)
Beta 2: 4.4 % (ref 3.2–6.5)
Beta Globulin: 8.2 % — ABNORMAL HIGH (ref 4.7–7.2)
Gamma Globulin: 11.2 % (ref 11.1–18.8)
TOTAL PROTEIN, SERUM ELECTROPHOR: 6.9 g/dL (ref 6.0–8.3)

## 2013-12-16 LAB — COPPER, SERUM: Copper: 121 ug/dL (ref 70–175)

## 2013-12-18 LAB — VITAMIN B6: VITAMIN B6: 16.6 ng/mL (ref 2.1–21.7)

## 2013-12-20 ENCOUNTER — Emergency Department (HOSPITAL_COMMUNITY): Payer: BC Managed Care – PPO

## 2013-12-20 ENCOUNTER — Ambulatory Visit
Admission: RE | Admit: 2013-12-20 | Discharge: 2013-12-20 | Disposition: A | Discharge status: Home / Self Care | Payer: BC Managed Care – PPO | Source: Ambulatory Visit | Attending: Neurology | Admitting: Neurology

## 2013-12-20 ENCOUNTER — Emergency Department (HOSPITAL_COMMUNITY)
Admission: EM | Admit: 2013-12-20 | Discharge: 2013-12-21 | Disposition: A | Discharge status: Home / Self Care | Payer: BC Managed Care – PPO | Attending: Emergency Medicine | Admitting: Emergency Medicine

## 2013-12-20 ENCOUNTER — Encounter (HOSPITAL_COMMUNITY): Payer: Self-pay | Admitting: Emergency Medicine

## 2013-12-20 DIAGNOSIS — M79676 Pain in unspecified toe(s): Secondary | ICD-10-CM | POA: Diagnosis not present

## 2013-12-20 DIAGNOSIS — I10 Essential (primary) hypertension: Secondary | ICD-10-CM | POA: Insufficient documentation

## 2013-12-20 DIAGNOSIS — M79673 Pain in unspecified foot: Secondary | ICD-10-CM | POA: Diagnosis not present

## 2013-12-20 DIAGNOSIS — Z86010 Personal history of colonic polyps: Secondary | ICD-10-CM | POA: Diagnosis not present

## 2013-12-20 DIAGNOSIS — R200 Anesthesia of skin: Secondary | ICD-10-CM | POA: Insufficient documentation

## 2013-12-20 DIAGNOSIS — G9520 Unspecified cord compression: Secondary | ICD-10-CM

## 2013-12-20 DIAGNOSIS — Z87891 Personal history of nicotine dependence: Secondary | ICD-10-CM | POA: Insufficient documentation

## 2013-12-20 DIAGNOSIS — R269 Unspecified abnormalities of gait and mobility: Secondary | ICD-10-CM | POA: Diagnosis present

## 2013-12-20 DIAGNOSIS — R292 Abnormal reflex: Secondary | ICD-10-CM

## 2013-12-20 DIAGNOSIS — F329 Major depressive disorder, single episode, unspecified: Secondary | ICD-10-CM | POA: Diagnosis not present

## 2013-12-20 DIAGNOSIS — G9529 Other cord compression: Secondary | ICD-10-CM | POA: Diagnosis not present

## 2013-12-20 DIAGNOSIS — J45909 Unspecified asthma, uncomplicated: Secondary | ICD-10-CM | POA: Insufficient documentation

## 2013-12-20 DIAGNOSIS — Z793 Long term (current) use of hormonal contraceptives: Secondary | ICD-10-CM | POA: Insufficient documentation

## 2013-12-20 DIAGNOSIS — K219 Gastro-esophageal reflux disease without esophagitis: Secondary | ICD-10-CM | POA: Insufficient documentation

## 2013-12-20 DIAGNOSIS — F419 Anxiety disorder, unspecified: Secondary | ICD-10-CM | POA: Diagnosis not present

## 2013-12-20 DIAGNOSIS — Z79899 Other long term (current) drug therapy: Secondary | ICD-10-CM | POA: Insufficient documentation

## 2013-12-20 LAB — I-STAT CREATININE, ED: Creatinine, Ser: 0.8 mg/dL (ref 0.50–1.10)

## 2013-12-20 MED ORDER — GADOBENATE DIMEGLUMINE 529 MG/ML IV SOLN: 20.0000 mL | Freq: Once | INTRAVENOUS | Status: DC

## 2013-12-20 NOTE — ED Notes (Signed)
Pt. received a call from her MD regarding result of neck MRI done today - advised to go to ER for further evaluation , states feet numbness for several months and balance problem / unsteady gait.

## 2013-12-20 NOTE — ED Notes (Signed)
Patient still at MR.

## 2013-12-20 NOTE — ED Notes (Signed)
Patient transported to MRI 

## 2013-12-20 NOTE — ED Provider Notes (Signed)
CSN: [id number]     Arrival date & time 12/20/13  2046 History   First MD Initiated Contact with Patient 12/20/13 2059     Chief Complaint  Patient presents with  . Gait Problem   Vanessa Middleton is a 58 y.o. female presenting to the [facility] ED due to abnormal imaging findings. She was told to present to the ED due to spinal cord compression found on MRI of her cervical spine. This imaging was ordered by a neurologist, Dr. Delice Lesch, to investigate gait abnormality, hyperreflexia, and sensory changes in the distal extremities not explained by laboratory findings. She reports no new symptoms. She denies bowel/bladder incontinence, saddle anesthesia, fevers, chills, midline back pain, neck pain or stiffness, headache, visual disturbances. She has not fallen recently but continues to have an abnormal gait and numbness worse in her toes and feet with aching pain at times. She reports some numbness in her fingers intermittently though this is much less bothersome.   (Consider location/radiation/quality/duration/timing/severity/associated sxs/prior Treatment) HPI  Past Medical History  Diagnosis Date  . Asthma   . GERD (gastroesophageal reflux disease)     EGD 10/11  . Hypertension   . Fatty liver     per [place] 9/11  . Personal history of colonic polyps 11/23/2007  . Anxiety and depression   . Anxiety and depression 10-2011   Past Surgical History  Procedure Laterality Date  . Breast biopsy  07/2007    neg  . Appendectomy  01/1997  . Cesarean section  03/1996  . Tonsillectomy    . Plantar fascitis      right foot  . Pilonidal cyst excision    . Colonoscopy    . Esophagogastroduodenoscopy     Family History  Problem Relation Age of Onset  . Melanoma Father   . Coronary artery disease Neg Hx   . Stroke Neg Hx   . Colon cancer Neg Hx   . Breast cancer Neg Hx   . Diabetes Other     cousin  . Hypertension Mother    History  Substance Use Topics  . Smoking status: Former Smoker     Quit date: 02/13/1996  . Smokeless tobacco: Never Used     Comment: used to smoke 2 ppd  . Alcohol Use: 0.0 oz/week    0 Not specified per week     Comment: 2-3 x/week   OB History    No data available     Review of Systems    Allergies  Review of patient's allergies indicates no known allergies.  Home Medications   Prior to Admission medications   Medication Sig Start Date End Date Taking? Authorizing Provider  albuterol (VENTOLIN HFA) 108 (90 BASE) MCG/ACT inhaler INHALE 2 PUFFS BY MOUTH EVERY 6 HOURS AS NEEDED FOR COUGH OR WHEEZING Patient taking differently: Inhale 1-2 puffs into the lungs every 6 (six) hours as needed for wheezing or shortness of breath. INHALE 2 PUFFS BY MOUTH EVERY 6 HOURS AS NEEDED FOR COUGH OR WHEEZING 05/25/13  Yes Colon Branch, MD  ALPRAZolam (XANAX) 0.5 MG tablet TAKE 1 TABLET BY MOUTH THREE TIMES DAILY AS NEEDED Patient taking differently: Take 0.5 mg by mouth 3 (three) times daily as needed for anxiety or sleep. TAKE 1 TABLET BY MOUTH THREE TIMES DAILY AS NEEDED 02/19/13  Yes Colon Branch, MD  cholecalciferol (VITAMIN D) 1000 UNITS tablet Take 2,000 Units by mouth daily.     Yes Historical Provider, MD  hydrochlorothiazide (HYDRODIURIL) 25  MG tablet TAKE 1 TABLET BY MOUTH DAILY   Yes Colon Branch, MD  levonorgestrel (MIRENA) 20 MCG/24HR IUD 1 each by Intrauterine route once.   Yes Historical Provider, MD  Melatonin 5 MG TABS Take 5 mg by mouth at bedtime.   Yes Historical Provider, MD  montelukast (SINGULAIR) 10 MG tablet TAKE 1 TABLET BY MOUTH EVERY NIGHT AT BEDTIME 10/01/13  Yes Colon Branch, MD  omeprazole (PRILOSEC) 40 MG capsule TAKE ONE CAPSULE BY MOUTH EVERY MORNING   Yes Colon Branch, MD  ranitidine (ZANTAC) 300 MG tablet TAKE 1 TABLET BY MOUTH EVERY NIGHT AT BEDTIME 07/17/13  Yes Colon Branch, MD  sertraline (ZOLOFT) 100 MG tablet  07/17/13  Yes Historical Provider, MD  SYMBICORT 160-4.5 MCG/ACT inhaler INHALE 2 PUFFS BY MOUTH TWICE DAILY 11/20/13  Yes Colon Branch,  MD   BP 151/56 mmHg  Pulse 78  Temp(Src) 98.3 F (36.8 C)  Resp 18  Ht 5\' 4"  (1.626 m)  Wt 225 lb (102.059 kg)  BMI 38.60 kg/m2  SpO2 98%  LMP 05/01/2010 Physical Exam  Constitutional: She is oriented to person, place, and time. She appears well-developed and well-nourished. No distress.  Eyes: EOM are normal. Pupils are equal, round, and reactive to light. No scleral icterus.  Neck: Normal range of motion. Neck supple. No JVD present.  Cardiovascular: Normal rate, regular rhythm and intact distal pulses.   Pulmonary/Chest: Effort normal and breath sounds normal. No respiratory distress.  Abdominal: Soft. Bowel sounds are normal. She exhibits no distension. There is no tenderness.  Musculoskeletal: Normal range of motion. She exhibits no edema or tenderness.  Lymphadenopathy:    She has no cervical adenopathy.  Neurological: She is alert and oriented to person, place, and time.  CN II-XII intact grossly. Strength 5/5 x4 extremities. Diminished sensation below level of ankles bilaterally. 3+ patellar DTRs. Normal finger-to-nose. + Romberg  Skin: Skin is warm and dry.  Nursing note and vitals reviewed.   ED Course  Procedures (including critical care time) Labs Review Labs Reviewed  I-STAT CREATININE, ED    Imaging Review  Mr Brain Wo Contrast  12/20/2013   CLINICAL DATA:  Hyper reflexia.  Loss of balance.  EXAM: MRI HEAD WITHOUT CONTRAST  TECHNIQUE: Multiplanar, multiecho pulse sequences of the brain and surrounding structures were obtained without intravenous contrast.  COMPARISON:  None.  FINDINGS: Calvarium and upper cervical spine: No marrow signal abnormality.  Orbits: No significant findings.  Sinuses: Clear. Mastoid and middle ears are clear.  Brain: No acute abnormality such as acute infarct, hemorrhage, hydrocephalus, or mass lesion. No evidence of large vessel occlusion. No volume loss or signal abnormality in the cortical spinal tracts. There is a thin halo of T2 and  FLAIR hyperintensity around the non dilated lateral ventricles, likely present in 2012 (faintly visible around the frontal horns). This and 2 foci of left cerebral white matter FLAIR hyperintensity likely reflect changes of chronic small vessel ischemia in this patient with chronic hypertension. No typical demyelination changes, notable due to cervical spine findings.  IMPRESSION: 1. No findings in the corticospinal tracts to explain hyperreflexia. 2. Mild white matter disease.   Electronically Signed   By: Jorje Guild M.D.   On: 12/20/2013 20:31   Mr Cervical Spine Wo Contrast  12/20/2013   CLINICAL DATA:  Hyper reflexia.  EXAM: MRI CERVICAL SPINE WITHOUT CONTRAST  TECHNIQUE: Multiplanar, multisequence MR imaging of the cervical spine was performed. No intravenous contrast was administered.  COMPARISON:  None.  FINDINGS: Abnormality within the left spinal canal with extending from the C7-T1 disc to the T2-T3 disc. The discrete signal abnormality has the appearance of a mass lesion, with severe compression of the spinal cord to the right. The abnormality is T2 hyperintense and T1 hypo intense with amorphous T2 hypo intensity centrally. Thecal sac positioning and interface with the cord suggests a intradural, extramedullary mass lesion. The lesion is indeterminate ; orientation and lack of foraminal narrowing argues against a schwannoma, signal intensity and fusiform shape would be unusual for meningioma, internal T2 hypointensity would be unusual for an uncomplicated meningeal cyst (although this raises the possibility of a rare epidermoid cyst). No segmentation anomaly or continuation to the posterior mediastinum. Suspect T2 hyperintensity within the spinal cord below the mass lesion. Gadolinium could not be administered due to lack of physician presence during scanning, diffusion imaging not available on this scanner. These results were called by telephone at the time of interpretation on 12/20/2013 at  approximately 8 pm to Dr. Carles Collet , who verbally acknowledged these results. The patient was referred to the [facility] emergency room.  No marrow signal abnormality suggestive of fracture, infection, or neoplasm. There is a small hemangioma within the C3 vertebral body.  Central disc protrusion at C6-7, contacting the ventral cord. There is a smaller central disc herniation at C5-6, without cord contact.  IMPRESSION: 1. Mass (intrathecal, extramedullary) in the left spinal canal at the level of T1 and T2, with severe spinal cord compression. The lesion is indeterminate, as discussed above. Postcontrast imaging is recommended to differentiate between cystic and solid masses. 2. Possible upper thoracic spinal cord edema.   Electronically Signed   By: Jorje Guild M.D.   On: 12/20/2013 21:54     EKG Interpretation None      MDM   Final diagnoses:  Cervical spinal cord compression    Spoke with Dr. Pascal Lux, reading radiologist who reports significant cervical spinal cord compression at multiple levels. He recommends repeat imaging with gadolinium contrast to evaluate for enhancement to better definition. No evidence of renal impairment. Will order MR C and T spine with contrast. Call put out to neurosurgery.     Dot Lanes, MD 12/30/13 [phone]

## 2013-12-21 ENCOUNTER — Telehealth: Payer: Self-pay | Admitting: Neurology

## 2013-12-21 NOTE — Telephone Encounter (Signed)
Pt needs to talk to someone about the scan that was done and to see if we can help her get in to a dr office soon please call [phone]

## 2013-12-21 NOTE — Discharge Instructions (Signed)
You need to follow up with the neurosurgeon, Dr. Ronnald Ramp, on Monday, 12/21/2013 to discuss in more detail the results of your MRI scan and the next steps in management. Call his office at 9:00am when they open to ask when they will be able to see you.

## 2013-12-21 NOTE — ED Notes (Signed)
Patient is alert and orientedx4.  Patient was explained discharge instructions and they understood them with no questions.  The patient's son, Will Truett Mainland is taking the patient home.

## 2013-12-21 NOTE — Telephone Encounter (Signed)
Returned call. She initially wanted to follow-up on phone call & ed visit last night which she spoke with Dr. Carles Collet and she was supposed to be seeing a neurosurgeon.  Patient states that she has heard from [facility] and she has an appt tomorrow with Dr. Ronnald Ramp @ 10:30am.

## 2013-12-22 ENCOUNTER — Other Ambulatory Visit: Payer: Self-pay | Admitting: Neurological Surgery

## 2014-01-09 NOTE — Pre-Procedure Instructions (Signed)
Vanessa Middleton  01/09/2014   Your procedure is scheduled on:  December 3  Report to [facility] Admitting at 10:15 AM.  Call this number if you have problems the morning of surgery: [phone]   Remember:   Do not eat food or drink liquids after midnight.   Take these medicines the morning of surgery with A SIP OF WATER: Albuterol (if needed), Xanax (if needed), Omeprazole, Zoloft, Symbicort   STOP Melatonin today   STOP/ Do not take Aspirin, Aleve, Naproxen, Advil, Ibuprofen, Motrin, Vitamins, Herbs, or Supplements starting today   Do not wear jewelry, make-up or nail polish.  Do not wear lotions, powders, or perfumes. You may wear deodorant.  Do not shave 48 hours prior to surgery. Men may shave face and neck.  Do not bring valuables to the hospital.  [facility] is not responsible for any belongings or valuables.               Contacts, dentures or bridgework may not be worn into surgery.  Leave suitcase in the car. After surgery it may be brought to your room.  For patients admitted to the hospital, discharge time is determined by your treatment team.               Special Instructions:  - Preparing for Surgery  Before surgery, you can play an important role.  Because skin is not sterile, your skin needs to be as free of germs as possible.  You can reduce the number of germs on you skin by washing with CHG (chlorahexidine gluconate) soap before surgery.  CHG is an antiseptic cleaner which kills germs and bonds with the skin to continue killing germs even after washing.  Please DO NOT use if you have an allergy to CHG or antibacterial soaps.  If your skin becomes reddened/irritated stop using the CHG and inform your nurse when you arrive at Short Stay.  Do not shave (including legs and underarms) for at least 48 hours prior to the first CHG shower.  You may shave your face.  Please follow these instructions carefully:   1.  Shower with CHG Soap the  night before surgery and the morning of Surgery.  2.  If you choose to wash your hair, wash your hair first as usual with your normal shampoo.  3.  After you shampoo, rinse your hair and body thoroughly to remove the shampoo.  4.  Use CHG as you would any other liquid soap.  You can apply CHG directly to the skin and wash gently with scrungie or a clean washcloth.  5.  Apply the CHG Soap to your body ONLY FROM THE NECK DOWN.  Do not use on open wounds or open sores.  Avoid contact with your eyes, ears, mouth and genitals (private parts).  Wash genitals (private parts) with your normal soap.  6.  Wash thoroughly, paying special attention to the area where your surgery will be performed.  7.  Thoroughly rinse your body with warm water from the neck down.  8.  DO NOT shower/wash with your normal soap after using and rinsing off the CHG Soap.  9.  Pat yourself dry with a clean towel.            10.  Wear clean pajamas.            11.  Place clean sheets on your bed the night of your first shower and do not sleep with pets.  Day of Surgery  Do not apply any lotions the morning of surgery.  Please wear clean clothes to the hospital/surgery center.     Please read over the following fact sheets that you were given: Pain Booklet, Coughing and Deep Breathing and Surgical Site Infection Prevention

## 2014-01-10 ENCOUNTER — Other Ambulatory Visit: Payer: Self-pay | Admitting: Internal Medicine

## 2014-01-11 ENCOUNTER — Encounter: Payer: Self-pay | Admitting: Neurology

## 2014-01-11 ENCOUNTER — Ambulatory Visit (HOSPITAL_COMMUNITY)
Admission: RE | Admit: 2014-01-11 | Discharge: 2014-01-11 | Disposition: A | Discharge status: Home / Self Care | Payer: BC Managed Care – PPO | Source: Ambulatory Visit | Attending: Neurological Surgery | Admitting: Neurological Surgery

## 2014-01-11 ENCOUNTER — Encounter (HOSPITAL_COMMUNITY)
Admission: RE | Admit: 2014-01-11 | Discharge: 2014-01-11 | Disposition: A | Discharge status: Home / Self Care | Payer: BC Managed Care – PPO | Source: Ambulatory Visit | Attending: Neurological Surgery | Admitting: Neurological Surgery

## 2014-01-11 ENCOUNTER — Encounter (HOSPITAL_COMMUNITY): Payer: Self-pay

## 2014-01-11 DIAGNOSIS — D492 Neoplasm of unspecified behavior of bone, soft tissue, and skin: Secondary | ICD-10-CM

## 2014-01-11 HISTORY — DX: Anxiety disorder, unspecified: F41.9

## 2014-01-11 HISTORY — DX: Other complications of anesthesia, initial encounter: T88.59XA

## 2014-01-11 HISTORY — DX: Other specified postprocedural states: Z98.890

## 2014-01-11 HISTORY — DX: Other specified postprocedural states: R11.2

## 2014-01-11 HISTORY — DX: Reserved for inherently not codable concepts without codable children: IMO0001

## 2014-01-11 HISTORY — DX: Major depressive disorder, single episode, unspecified: F32.9

## 2014-01-11 HISTORY — DX: Adverse effect of unspecified anesthetic, initial encounter: T41.45XA

## 2014-01-11 HISTORY — DX: Personal history of other medical treatment: Z92.89

## 2014-01-11 HISTORY — DX: Depression, unspecified: F32.A

## 2014-01-11 LAB — BASIC METABOLIC PANEL
Anion gap: 15 (ref 5–15)
BUN: 20 mg/dL (ref 6–23)
CO2: 24 mEq/L (ref 19–32)
Calcium: 9.1 mg/dL (ref 8.4–10.5)
Chloride: 99 mEq/L (ref 96–112)
Creatinine, Ser: 0.71 mg/dL (ref 0.50–1.10)
GFR calc Af Amer: >90 mL/min (ref >90)
Glucose, Bld: 111 mg/dL — ABNORMAL HIGH (ref 70–99)
POTASSIUM: 4.2 meq/L (ref 3.7–5.3)
Sodium: 138 mEq/L (ref 137–147)

## 2014-01-11 LAB — CBC WITH DIFFERENTIAL/PLATELET
BASOS ABS: 0 10*3/uL (ref 0.0–0.1)
BASOS PCT: 0 % (ref 0–1)
EOS ABS: 0.2 10*3/uL (ref 0.0–0.7)
Eosinophils Relative: 2 % (ref 0–5)
HCT: 39.5 % (ref 36.0–46.0)
Hemoglobin: 12.5 g/dL (ref 12.0–15.0)
LYMPHS ABS: 1.8 10*3/uL (ref 0.7–4.0)
Lymphocytes Relative: 21 % (ref 12–46)
MCH: 26.4 pg (ref 26.0–34.0)
MCHC: 31.6 g/dL (ref 30.0–36.0)
MCV: 83.3 fL (ref 78.0–100.0)
Monocytes Absolute: 0.8 10*3/uL (ref 0.1–1.0)
Monocytes Relative: 9 % (ref 3–12)
NEUTROS PCT: 68 % (ref 43–77)
Neutro Abs: 5.6 10*3/uL (ref 1.7–7.7)
Platelets: 367 10*3/uL (ref 150–400)
RBC: 4.74 MIL/uL (ref 3.87–5.11)
RDW: 13.4 % (ref 11.5–15.5)
WBC: 8.3 10*3/uL (ref 4.0–10.5)

## 2014-01-11 LAB — PROTIME-INR
INR: 0.97 (ref 0.00–1.49)
PROTHROMBIN TIME: 13 s (ref 11.6–15.2)

## 2014-01-11 LAB — SURGICAL PCR SCREEN
MRSA, PCR: NEGATIVE
Staphylococcus aureus: POSITIVE — AB

## 2014-01-11 NOTE — Progress Notes (Signed)
Mupirocin Ointment called into [facility] on [address] for positive PCR of staph. Pt notified and voiced understanding.

## 2014-01-11 NOTE — Progress Notes (Signed)
Referred by Dr. Larose Kells- PCP, for PFT's several yrs. [place], told that the results were wnl, never referred to pulmonologist. All resp. Needs managed by Dr. Larose Kells.  Pt. Reports that her breathing is a little more tight today, for unknown reason. CXR- 2V ordered.

## 2014-01-13 MED ORDER — CEFAZOLIN SODIUM-DEXTROSE 2-3 GM-% IV SOLR
2.0000 g | INTRAVENOUS | Status: AC
  Administered 2014-01-14: 2 g via INTRAVENOUS
  Filled 2014-01-13: qty 50

## 2014-01-13 MED ORDER — DEXAMETHASONE SODIUM PHOSPHATE 10 MG/ML IJ SOLN
10.0000 mg | INTRAMUSCULAR | Status: AC
  Administered 2014-01-14: 10 mg via INTRAVENOUS
  Filled 2014-01-13: qty 1

## 2014-01-14 ENCOUNTER — Inpatient Hospital Stay (HOSPITAL_COMMUNITY): Payer: BC Managed Care – PPO

## 2014-01-14 ENCOUNTER — Inpatient Hospital Stay (HOSPITAL_COMMUNITY): Payer: BC Managed Care – PPO | Admitting: Certified Registered"

## 2014-01-14 ENCOUNTER — Encounter (HOSPITAL_COMMUNITY)
Admission: RE | Disposition: A | Discharge status: Home / Self Care | Payer: Self-pay | Source: Ambulatory Visit | Attending: Neurological Surgery

## 2014-01-14 ENCOUNTER — Inpatient Hospital Stay (HOSPITAL_COMMUNITY)
Admission: RE | Admit: 2014-01-14 | Discharge: 2014-01-18 | DRG: 029 | Disposition: A | Discharge status: Home / Self Care | Payer: BC Managed Care – PPO | Source: Ambulatory Visit | Attending: Neurological Surgery | Admitting: Neurological Surgery

## 2014-01-14 ENCOUNTER — Encounter (HOSPITAL_COMMUNITY): Payer: Self-pay | Admitting: Neurological Surgery

## 2014-01-14 DIAGNOSIS — Z808 Family history of malignant neoplasm of other organs or systems: Secondary | ICD-10-CM

## 2014-01-14 DIAGNOSIS — J45909 Unspecified asthma, uncomplicated: Secondary | ICD-10-CM | POA: Diagnosis present

## 2014-01-14 DIAGNOSIS — G992 Myelopathy in diseases classified elsewhere: Secondary | ICD-10-CM | POA: Diagnosis present

## 2014-01-14 DIAGNOSIS — D421 Neoplasm of uncertain behavior of spinal meninges: Secondary | ICD-10-CM | POA: Diagnosis present

## 2014-01-14 DIAGNOSIS — Z833 Family history of diabetes mellitus: Secondary | ICD-10-CM

## 2014-01-14 DIAGNOSIS — Z8249 Family history of ischemic heart disease and other diseases of the circulatory system: Secondary | ICD-10-CM | POA: Diagnosis not present

## 2014-01-14 DIAGNOSIS — D499 Neoplasm of unspecified behavior of unspecified site: Secondary | ICD-10-CM

## 2014-01-14 DIAGNOSIS — K219 Gastro-esophageal reflux disease without esophagitis: Secondary | ICD-10-CM | POA: Diagnosis present

## 2014-01-14 DIAGNOSIS — F419 Anxiety disorder, unspecified: Secondary | ICD-10-CM | POA: Diagnosis present

## 2014-01-14 DIAGNOSIS — D497 Neoplasm of unspecified behavior of endocrine glands and other parts of nervous system: Secondary | ICD-10-CM | POA: Diagnosis present

## 2014-01-14 DIAGNOSIS — Z87891 Personal history of nicotine dependence: Secondary | ICD-10-CM | POA: Diagnosis not present

## 2014-01-14 DIAGNOSIS — D4989 Neoplasm of unspecified behavior of other specified sites: Secondary | ICD-10-CM | POA: Diagnosis present

## 2014-01-14 DIAGNOSIS — I10 Essential (primary) hypertension: Secondary | ICD-10-CM | POA: Diagnosis present

## 2014-01-14 DIAGNOSIS — Z79899 Other long term (current) drug therapy: Secondary | ICD-10-CM | POA: Diagnosis not present

## 2014-01-14 DIAGNOSIS — F329 Major depressive disorder, single episode, unspecified: Secondary | ICD-10-CM | POA: Diagnosis present

## 2014-01-14 HISTORY — PX: LAMINECTOMY: SHX219

## 2014-01-14 SURGERY — CERVICAL LAMINECTOMY FOR TUMOR
Anesthesia: General

## 2014-01-14 MED ORDER — MIDAZOLAM HCL 2 MG/2ML IJ SOLN
INTRAMUSCULAR | Status: AC
  Filled 2014-01-14: qty 2

## 2014-01-14 MED ORDER — ACETAMINOPHEN 650 MG RE SUPP: 650.0000 mg | RECTAL | Status: DC | PRN

## 2014-01-14 MED ORDER — SENNA 8.6 MG PO TABS
1.0000 | ORAL_TABLET | Freq: Two times a day (BID) | ORAL | Status: DC
  Administered 2014-01-14 – 2014-01-18 (×8): 8.6 mg via ORAL
  Filled 2014-01-14 (×8): qty 1

## 2014-01-14 MED ORDER — DEXAMETHASONE SODIUM PHOSPHATE 4 MG/ML IJ SOLN
4.0000 mg | Freq: Four times a day (QID) | INTRAMUSCULAR | Status: DC
  Administered 2014-01-14 – 2014-01-17 (×2): 4 mg via INTRAVENOUS
  Filled 2014-01-14 (×2): qty 1

## 2014-01-14 MED ORDER — LIDOCAINE HCL (CARDIAC) 20 MG/ML IV SOLN
INTRAVENOUS | Status: AC
  Filled 2014-01-14: qty 5

## 2014-01-14 MED ORDER — PHENOL 1.4 % MT LIQD: 1.0000 | OROMUCOSAL | Status: DC | PRN

## 2014-01-14 MED ORDER — LEVONORGESTREL 20 MCG/24HR IU IUD: 1.0000 | INTRAUTERINE_SYSTEM | Freq: Once | INTRAUTERINE | Status: DC

## 2014-01-14 MED ORDER — PHENYLEPHRINE HCL 10 MG/ML IJ SOLN
INTRAMUSCULAR | Status: DC | PRN
  Administered 2014-01-14 (×2): 80 ug via INTRAVENOUS
  Administered 2014-01-14: 160 ug via INTRAVENOUS
  Administered 2014-01-14: 80 ug via INTRAVENOUS

## 2014-01-14 MED ORDER — BUPIVACAINE HCL (PF) 0.25 % IJ SOLN
INTRAMUSCULAR | Status: DC | PRN
  Administered 2014-01-14: 8 mL

## 2014-01-14 MED ORDER — NEOSTIGMINE METHYLSULFATE 10 MG/10ML IV SOLN
INTRAVENOUS | Status: DC | PRN
  Administered 2014-01-14: 5 mg via INTRAVENOUS

## 2014-01-14 MED ORDER — BUDESONIDE-FORMOTEROL FUMARATE 160-4.5 MCG/ACT IN AERO
2.0000 | INHALATION_SPRAY | Freq: Two times a day (BID) | RESPIRATORY_TRACT | Status: DC
  Administered 2014-01-14 – 2014-01-18 (×8): 2 via RESPIRATORY_TRACT
  Filled 2014-01-14: qty 6

## 2014-01-14 MED ORDER — PHENYLEPHRINE HCL 10 MG/ML IJ SOLN
10.0000 mg | INTRAVENOUS | Status: DC | PRN
  Administered 2014-01-14: 20 ug/min via INTRAVENOUS

## 2014-01-14 MED ORDER — VECURONIUM BROMIDE 10 MG IV SOLR
INTRAVENOUS | Status: AC
  Filled 2014-01-14: qty 10

## 2014-01-14 MED ORDER — SERTRALINE HCL 50 MG PO TABS
150.0000 mg | ORAL_TABLET | Freq: Every day | ORAL | Status: DC
  Administered 2014-01-15 – 2014-01-18 (×4): 150 mg via ORAL
  Filled 2014-01-14 (×8): qty 1

## 2014-01-14 MED ORDER — ONDANSETRON HCL 4 MG/2ML IJ SOLN
INTRAMUSCULAR | Status: DC | PRN
  Administered 2014-01-14: 4 mg via INTRAVENOUS

## 2014-01-14 MED ORDER — BACITRACIN 50000 UNITS IM SOLR
INTRAMUSCULAR | Status: DC | PRN
  Administered 2014-01-14: 500 mL

## 2014-01-14 MED ORDER — OXYCODONE-ACETAMINOPHEN 5-325 MG PO TABS
1.0000 | ORAL_TABLET | ORAL | Status: DC | PRN
  Administered 2014-01-14 – 2014-01-18 (×17): 2 via ORAL
  Filled 2014-01-14 (×17): qty 2

## 2014-01-14 MED ORDER — FENTANYL CITRATE 0.05 MG/ML IJ SOLN
INTRAMUSCULAR | Status: AC
  Filled 2014-01-14: qty 5

## 2014-01-14 MED ORDER — METHOCARBAMOL 500 MG PO TABS
500.0000 mg | ORAL_TABLET | Freq: Four times a day (QID) | ORAL | Status: DC | PRN
  Administered 2014-01-15 – 2014-01-18 (×5): 500 mg via ORAL
  Filled 2014-01-14 (×5): qty 1

## 2014-01-14 MED ORDER — HYDROCHLOROTHIAZIDE 25 MG PO TABS
25.0000 mg | ORAL_TABLET | Freq: Every day | ORAL | Status: DC
  Administered 2014-01-15 – 2014-01-18 (×4): 25 mg via ORAL
  Filled 2014-01-14 (×4): qty 1

## 2014-01-14 MED ORDER — MONTELUKAST SODIUM 10 MG PO TABS
10.0000 mg | ORAL_TABLET | Freq: Every day | ORAL | Status: DC
Start: 2014-01-14 — End: 2014-01-18
  Administered 2014-01-14 – 2014-01-17 (×4): 10 mg via ORAL
  Filled 2014-01-14 (×4): qty 1

## 2014-01-14 MED ORDER — ROCURONIUM BROMIDE 50 MG/5ML IV SOLN
INTRAVENOUS | Status: AC
  Filled 2014-01-14: qty 1

## 2014-01-14 MED ORDER — STERILE WATER FOR INJECTION IJ SOLN
INTRAMUSCULAR | Status: AC
  Filled 2014-01-14: qty 10

## 2014-01-14 MED ORDER — HYDROMORPHONE HCL 1 MG/ML IJ SOLN
INTRAMUSCULAR | Status: AC
  Administered 2014-01-14: 0.5 mg via INTRAVENOUS
  Filled 2014-01-14: qty 1

## 2014-01-14 MED ORDER — ACETAMINOPHEN 325 MG PO TABS: 650.0000 mg | ORAL_TABLET | ORAL | Status: DC | PRN

## 2014-01-14 MED ORDER — ONDANSETRON HCL 4 MG/2ML IJ SOLN: 4.0000 mg | Freq: Once | INTRAMUSCULAR | Status: DC | PRN

## 2014-01-14 MED ORDER — MENTHOL 3 MG MT LOZG: 1.0000 | LOZENGE | OROMUCOSAL | Status: DC | PRN

## 2014-01-14 MED ORDER — 0.9 % SODIUM CHLORIDE (POUR BTL) OPTIME
TOPICAL | Status: DC | PRN
  Administered 2014-01-14: 1000 mL

## 2014-01-14 MED ORDER — ARTIFICIAL TEARS OP OINT
TOPICAL_OINTMENT | OPHTHALMIC | Status: AC
  Filled 2014-01-14: qty 3.5

## 2014-01-14 MED ORDER — ALPRAZOLAM 0.5 MG PO TABS: 0.5000 mg | ORAL_TABLET | Freq: Three times a day (TID) | ORAL | Status: DC | PRN

## 2014-01-14 MED ORDER — LIDOCAINE HCL (CARDIAC) 20 MG/ML IV SOLN
INTRAVENOUS | Status: DC | PRN
  Administered 2014-01-14: 100 mg via INTRAVENOUS

## 2014-01-14 MED ORDER — DEXTROSE 5 % IV SOLN
500.0000 mg | Freq: Four times a day (QID) | INTRAVENOUS | Status: DC | PRN
  Filled 2014-01-14: qty 5

## 2014-01-14 MED ORDER — SURGIFOAM 100 EX MISC
CUTANEOUS | Status: DC | PRN
  Administered 2014-01-14: 20 mL via TOPICAL

## 2014-01-14 MED ORDER — ONDANSETRON HCL 4 MG/2ML IJ SOLN
INTRAMUSCULAR | Status: AC
  Filled 2014-01-14: qty 2

## 2014-01-14 MED ORDER — PHENYLEPHRINE 40 MCG/ML (10ML) SYRINGE FOR IV PUSH (FOR BLOOD PRESSURE SUPPORT)
PREFILLED_SYRINGE | INTRAVENOUS | Status: AC
  Filled 2014-01-14: qty 10

## 2014-01-14 MED ORDER — SODIUM CHLORIDE 0.9 % IJ SOLN: 3.0000 mL | INTRAMUSCULAR | Status: DC | PRN

## 2014-01-14 MED ORDER — POTASSIUM CHLORIDE IN NACL 20-0.9 MEQ/L-% IV SOLN
INTRAVENOUS | Status: DC
Start: 2014-01-14 — End: 2014-01-18
  Administered 2014-01-14: 1000 mL via INTRAVENOUS
  Filled 2014-01-14: qty 1000

## 2014-01-14 MED ORDER — ROCURONIUM BROMIDE 100 MG/10ML IV SOLN
INTRAVENOUS | Status: DC | PRN
  Administered 2014-01-14: 50 mg via INTRAVENOUS

## 2014-01-14 MED ORDER — FENTANYL CITRATE 0.05 MG/ML IJ SOLN
INTRAMUSCULAR | Status: DC | PRN
  Administered 2014-01-14: 100 ug via INTRAVENOUS
  Administered 2014-01-14: 50 ug via INTRAVENOUS
  Administered 2014-01-14: 100 ug via INTRAVENOUS
  Administered 2014-01-14: 50 ug via INTRAVENOUS

## 2014-01-14 MED ORDER — PROPOFOL 10 MG/ML IV BOLUS
INTRAVENOUS | Status: AC
  Filled 2014-01-14: qty 20

## 2014-01-14 MED ORDER — SODIUM CHLORIDE 0.9 % IV SOLN: 250.0000 mL | INTRAVENOUS | Status: DC

## 2014-01-14 MED ORDER — OXYCODONE HCL 5 MG PO TABS: 5.0000 mg | ORAL_TABLET | Freq: Once | ORAL | Status: DC | PRN

## 2014-01-14 MED ORDER — ONDANSETRON HCL 4 MG/2ML IJ SOLN: 4.0000 mg | INTRAMUSCULAR | Status: DC | PRN

## 2014-01-14 MED ORDER — ALBUTEROL SULFATE HFA 108 (90 BASE) MCG/ACT IN AERS
INHALATION_SPRAY | RESPIRATORY_TRACT | Status: AC
  Filled 2014-01-14: qty 6.7

## 2014-01-14 MED ORDER — GLYCOPYRROLATE 0.2 MG/ML IJ SOLN
INTRAMUSCULAR | Status: DC | PRN
  Administered 2014-01-14: .8 mg via INTRAVENOUS

## 2014-01-14 MED ORDER — ALBUTEROL SULFATE HFA 108 (90 BASE) MCG/ACT IN AERS: 1.0000 | INHALATION_SPRAY | Freq: Four times a day (QID) | RESPIRATORY_TRACT | Status: DC | PRN

## 2014-01-14 MED ORDER — HYDROMORPHONE HCL 1 MG/ML IJ SOLN
INTRAMUSCULAR | Status: AC
  Filled 2014-01-14: qty 1

## 2014-01-14 MED ORDER — SODIUM CHLORIDE 0.9 % IJ SOLN
3.0000 mL | Freq: Two times a day (BID) | INTRAMUSCULAR | Status: DC
  Administered 2014-01-14 – 2014-01-17 (×6): 3 mL via INTRAVENOUS

## 2014-01-14 MED ORDER — PROPOFOL 10 MG/ML IV BOLUS
INTRAVENOUS | Status: DC | PRN
  Administered 2014-01-14 (×2): 100 mg via INTRAVENOUS

## 2014-01-14 MED ORDER — DEXAMETHASONE 4 MG PO TABS
4.0000 mg | ORAL_TABLET | Freq: Four times a day (QID) | ORAL | Status: DC
  Administered 2014-01-14 – 2014-01-18 (×15): 4 mg via ORAL
  Filled 2014-01-14 (×16): qty 1

## 2014-01-14 MED ORDER — MORPHINE SULFATE 2 MG/ML IJ SOLN
1.0000 mg | INTRAMUSCULAR | Status: DC | PRN
  Administered 2014-01-15 (×3): 2 mg via INTRAVENOUS
  Filled 2014-01-14 (×4): qty 1

## 2014-01-14 MED ORDER — MICROFIBRILLAR COLL HEMOSTAT EX PADS
MEDICATED_PAD | CUTANEOUS | Status: DC | PRN
  Administered 2014-01-14: 1 via TOPICAL

## 2014-01-14 MED ORDER — LACTATED RINGERS IV SOLN
INTRAVENOUS | Status: DC
  Administered 2014-01-14 (×2): via INTRAVENOUS

## 2014-01-14 MED ORDER — THROMBIN 5000 UNITS EX SOLR
OROMUCOSAL | Status: DC | PRN
  Administered 2014-01-14 (×2): 10 mL via TOPICAL

## 2014-01-14 MED ORDER — PANTOPRAZOLE SODIUM 40 MG IV SOLR
40.0000 mg | Freq: Every day | INTRAVENOUS | Status: DC
  Administered 2014-01-14: 40 mg via INTRAVENOUS
  Filled 2014-01-14 (×2): qty 40

## 2014-01-14 MED ORDER — OXYCODONE HCL 5 MG/5ML PO SOLN: 5.0000 mg | Freq: Once | ORAL | Status: DC | PRN

## 2014-01-14 MED ORDER — ALBUTEROL SULFATE (2.5 MG/3ML) 0.083% IN NEBU: 2.5000 mg | INHALATION_SOLUTION | Freq: Four times a day (QID) | RESPIRATORY_TRACT | Status: DC | PRN

## 2014-01-14 MED ORDER — VECURONIUM BROMIDE 10 MG IV SOLR
INTRAVENOUS | Status: DC | PRN
  Administered 2014-01-14: 1 mg via INTRAVENOUS
  Administered 2014-01-14: 2 mg via INTRAVENOUS
  Administered 2014-01-14: 1 mg via INTRAVENOUS
  Administered 2014-01-14: 2 mg via INTRAVENOUS

## 2014-01-14 MED ORDER — HYDROMORPHONE HCL 1 MG/ML IJ SOLN
0.2500 mg | INTRAMUSCULAR | Status: DC | PRN
  Administered 2014-01-14 (×2): 0.5 mg via INTRAVENOUS

## 2014-01-14 MED ORDER — MIDAZOLAM HCL 5 MG/5ML IJ SOLN
INTRAMUSCULAR | Status: DC | PRN
  Administered 2014-01-14: 2 mg via INTRAVENOUS

## 2014-01-14 MED ORDER — CEFAZOLIN SODIUM 1-5 GM-% IV SOLN
1.0000 g | Freq: Three times a day (TID) | INTRAVENOUS | Status: AC
  Administered 2014-01-14 – 2014-01-15 (×2): 1 g via INTRAVENOUS
  Filled 2014-01-14 (×2): qty 50

## 2014-01-14 MED ORDER — ARTIFICIAL TEARS OP OINT
TOPICAL_OINTMENT | OPHTHALMIC | Status: DC | PRN
  Administered 2014-01-14: 1 via OPHTHALMIC

## 2014-01-14 SURGICAL SUPPLY — 71 items
APL SKNCLS STERI-STRIP NONHPOA (GAUZE/BANDAGES/DRESSINGS) ×1
APL SRG 60D 8 XTD TIP BNDBL (TIP) ×1
BAG DECANTER FOR FLEXI CONT (MISCELLANEOUS) ×3 IMPLANT
BENZOIN TINCTURE PRP APPL 2/3 (GAUZE/BANDAGES/DRESSINGS) ×3 IMPLANT
BLADE CLIPPER SURG (BLADE) IMPLANT
BRUSH SCRUB EZ PLAIN DRY (MISCELLANEOUS) ×3 IMPLANT
BUR MATCHSTICK NEURO 3.0 LAGG (BURR) IMPLANT
CANISTER SUCT 3000ML (MISCELLANEOUS) ×3 IMPLANT
CLIP TI MEDIUM 6 (CLIP) ×3 IMPLANT
CLOSURE WOUND 1/2 X4 (GAUZE/BANDAGES/DRESSINGS) ×1
CONT SPEC 4OZ CLIKSEAL STRL BL (MISCELLANEOUS) ×3 IMPLANT
DRAPE C-ARM 42X72 X-RAY (DRAPES) IMPLANT
DRAPE LAPAROTOMY 100X72 PEDS (DRAPES) IMPLANT
DRAPE LAPAROTOMY 100X72X124 (DRAPES) ×3 IMPLANT
DRAPE MICROSCOPE LEICA (MISCELLANEOUS) ×3 IMPLANT
DRAPE POUCH INSTRU U-SHP 10X18 (DRAPES) ×3 IMPLANT
DRAPE SURG 17X23 STRL (DRAPES) ×6 IMPLANT
DRSG OPSITE 4X5.5 SM (GAUZE/BANDAGES/DRESSINGS) ×6 IMPLANT
DRSG OPSITE POSTOP 4X8 (GAUZE/BANDAGES/DRESSINGS) ×3 IMPLANT
DRSG TELFA 3X8 NADH (GAUZE/BANDAGES/DRESSINGS) ×3 IMPLANT
DURASEAL APPLICATOR TIP (TIP) ×3 IMPLANT
DURASEAL SPINE SEALANT 3ML (MISCELLANEOUS) ×3 IMPLANT
ELECT REM PT RETURN 9FT ADLT (ELECTROSURGICAL) ×3
ELECTRODE REM PT RTRN 9FT ADLT (ELECTROSURGICAL) ×1 IMPLANT
GAUZE SPONGE 4X4 12PLY STRL (GAUZE/BANDAGES/DRESSINGS) ×3 IMPLANT
GAUZE SPONGE 4X4 16PLY XRAY LF (GAUZE/BANDAGES/DRESSINGS) IMPLANT
GLOVE BIO SURGEON STRL SZ8 (GLOVE) ×9 IMPLANT
GLOVE BIOGEL PI IND STRL 8 (GLOVE) ×1 IMPLANT
GLOVE BIOGEL PI INDICATOR 8 (GLOVE) ×2
GLOVE ECLIPSE 7.5 STRL STRAW (GLOVE) ×12 IMPLANT
GOWN STRL REUS W/ TWL LRG LVL3 (GOWN DISPOSABLE) IMPLANT
GOWN STRL REUS W/ TWL XL LVL3 (GOWN DISPOSABLE) ×7 IMPLANT
GOWN STRL REUS W/TWL 2XL LVL3 (GOWN DISPOSABLE) ×3 IMPLANT
GOWN STRL REUS W/TWL LRG LVL3 (GOWN DISPOSABLE)
GOWN STRL REUS W/TWL XL LVL3 (GOWN DISPOSABLE) ×21
HEMOSTAT POWDER KIT SURGIFOAM (HEMOSTASIS) IMPLANT
HEMOSTAT SURGICEL 2X14 (HEMOSTASIS) IMPLANT
KIT BASIN OR (CUSTOM PROCEDURE TRAY) ×3 IMPLANT
KIT ROOM TURNOVER OR (KITS) ×3 IMPLANT
LIQUID BAND (GAUZE/BANDAGES/DRESSINGS) ×3 IMPLANT
NEEDLE HYPO 22GX1.5 SAFETY (NEEDLE) ×3 IMPLANT
NEEDLE SPNL 20GX3.5 QUINCKE YW (NEEDLE) IMPLANT
NS IRRIG 1000ML POUR BTL (IV SOLUTION) ×3 IMPLANT
PACK LAMINECTOMY NEURO (CUSTOM PROCEDURE TRAY) ×3 IMPLANT
PATTIES SURGICAL .5 X3 (DISPOSABLE) ×3 IMPLANT
PATTIES SURGICAL 1/4 X 3 (GAUZE/BANDAGES/DRESSINGS) ×3 IMPLANT
RUBBERBAND STERILE (MISCELLANEOUS) ×6 IMPLANT
SPONGE LAP 4X18 X RAY DECT (DISPOSABLE) IMPLANT
SPONGE SURGIFOAM ABS GEL 100 (HEMOSTASIS) IMPLANT
STAPLER VISISTAT 35W (STAPLE) ×3 IMPLANT
STRIP CLOSURE SKIN 1/2X4 (GAUZE/BANDAGES/DRESSINGS) ×2 IMPLANT
SUT BONE WAX W31G (SUTURE) IMPLANT
SUT ETHILON 4 0 PS 2 18 (SUTURE) ×3 IMPLANT
SUT NURALON 4 0 TR CR/8 (SUTURE) IMPLANT
SUT PROLENE 5 0 C1 (SUTURE) ×21 IMPLANT
SUT PROLENE 5 0 CC1 (SUTURE) ×3 IMPLANT
SUT PROLENE 6 0 BV (SUTURE) IMPLANT
SUT SILK 2 0 TIES 10X30 (SUTURE) ×3 IMPLANT
SUT VIC AB 0 CT1 18XCR BRD8 (SUTURE) ×1 IMPLANT
SUT VIC AB 0 CT1 8-18 (SUTURE) ×3
SUT VIC AB 2-0 CP2 18 (SUTURE) ×9 IMPLANT
SUT VIC AB 3-0 SH 8-18 (SUTURE) IMPLANT
SUT VICRYL 4-0 PS2 18IN ABS (SUTURE) IMPLANT
SYR 20ML ECCENTRIC (SYRINGE) IMPLANT
TAPE STRIPS DRAPE STRL (GAUZE/BANDAGES/DRESSINGS) ×3 IMPLANT
TIP NONSTICK .5MMX23CM (INSTRUMENTS) ×2
TIP NONSTICK .5X23 (INSTRUMENTS) ×1 IMPLANT
TOWEL OR 17X24 6PK STRL BLUE (TOWEL DISPOSABLE) ×3 IMPLANT
TOWEL OR 17X26 10 PK STRL BLUE (TOWEL DISPOSABLE) ×3 IMPLANT
TRAY FOLEY CATH 14FRSI W/METER (CATHETERS) IMPLANT
WATER STERILE IRR 1000ML POUR (IV SOLUTION) ×3 IMPLANT

## 2014-01-14 NOTE — Op Note (Signed)
01/14/2014  6:51 PM  PATIENT:  Vanessa Middleton  58 y.o. female  PRE-OPERATIVE DIAGNOSIS:  Thoracic intradural extramedullary tumor with myelopathy  POST-OPERATIVE DIAGNOSIS:  Same  PROCEDURE:  C7-T3 laminectomy for resection of intradural extramedullary tumor utilizing microscopic dissection  SURGEON:  Sherley Bounds, MD  ASSISTANTS: Dr. Sherwood Gambler  ANESTHESIA:   General  EBL: 150 ml  Total I/O In: 1000 [I.V.:1000] Out: 340 [Urine:190; Blood:150]  BLOOD ADMINISTERED:none  DRAINS: None   SPECIMEN:  Excision  INDICATION FOR PROCEDURE: This patient presented with difficulty with gait. She was found to be myelopathic on exam. MRI of the cervical spine with and without contrast showed an enhancing intradural extramedullary lesion spanning T1-T2. Recommended laminectomy for resection of the lesion. Patient understood the risks, benefits, and alternatives and potential outcomes and wished to proceed.  PROCEDURE DETAILS: The patient was taken to the operating room and after induction of adequate general endotracheal anesthesia she was rolled into the prone position on chest rolls with her head affixed in a 3 point [place] radiolucent headrest. Her upper thoracic region was cleaned and then prepped with DuraPrep and then draped in the usual sterile fashion. 10 mL of local anesthesia was injected and a dorsal midline incision was made and carried down to the thoracic fascia. The fascia was opened and the paraspinous musculature was taken down in a subperiosteal fashion to expose C7-T3. Intraoperative fluoroscopy confirmed the level and then I used a combination of high-speed drill and the Kerrison punches to perform laminectomies from C7-T3. Hemostasis was achieved. The bony edges were lined with cottonoids. A midline durotomy was created and the dural edges were tacked back with 5-0 Prolene sutures. The operating microscope was draped and brought into the field. There was a large cystic tumor  with nerve root draped over the top of it extending to the right. We were able to work the tumor out from under the exiting nerve roots. We then found attached to 2 small rootlets. These were coagulated and cut sharply. The tumor was then removed en bloc. The spinal cord was flattened to the right-hand side of the thecal sac. We irrigated with saline solution. We inspected for any bleeding. We then closed the dura with a running 5-0 Prolene suture. We then inspected for any leakage of CSF. We lined the dura with Tisseel fibrin glue and Gelfoam. I then closed the muscle and the fascia with 0 Vicryl closed the subcutaneous tissues with 0 and 2-0 Vicryl and the subcuticular tissue with 3-0 Vicryl. The skin was closed with Dermabond, benzoin and Steri-Strips. The drapes were removed. A sterile dressing was applied. The patient was taken out of the 3-point May[place] headrest and awakened from general anesthesia and transferred to the recovery room stable condition. At the end of the procedure all sponge needle and initial counts were correct.  PLAN OF CARE: Admit to inpatient   PATIENT DISPOSITION:  PACU - hemodynamically stable.   Delay start of Pharmacological VTE agent (>24hrs) due to surgical blood loss or risk of bleeding:  yes

## 2014-01-14 NOTE — Anesthesia Procedure Notes (Signed)
Procedure Name: Intubation Date/Time: 01/14/2014 3:55 PM Performed by: Barrington Ellison Pre-anesthesia Checklist: Patient identified, Emergency Drugs available, Suction available, Patient being monitored and Timeout performed Patient Re-evaluated:Patient Re-evaluated prior to inductionOxygen Delivery Method: Circle system utilized Preoxygenation: Pre-oxygenation with 100% oxygen Intubation Type: IV induction Ventilation: Mask ventilation without difficulty Laryngoscope Size: Mac and 3 Grade View: Grade I Tube type: Oral Tube size: 7.0 mm Number of attempts: 1 Airway Equipment and Method: Stylet Placement Confirmation: ETT inserted through vocal cords under direct vision,  positive ETCO2 and breath sounds checked- equal and bilateral Secured at: 21 cm Tube secured with: Tape Dental Injury: Teeth and Oropharynx as per pre-operative assessment

## 2014-01-14 NOTE — H&P (Signed)
Subjective: Patient is a 58 y.o. female admitted for laminectomy for intradural extra medullary tumor. Onset of symptoms was a few months ago, gradually worsening since that time.  The pain is rated mild, and is located at the in the neck . Patient noticed a change in her gait. The pain is described as aching and occurs intermittently. The symptoms have been progressive. Symptoms are exacerbated by exercise. 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 GERD (gastroesophageal reflux disease)     EGD 10/11  . Hypertension   . Fatty liver     per [place] 9/11  . Personal history of colonic polyps 11/23/2007  . Anxiety and depression   . Anxiety and depression 10-2011  . Complication of anesthesia   . PONV (postoperative nausea and vomiting)   . Shortness of breath dyspnea     /w exertion   . H/O pulmonary function tests ?2000    ordered by Dr. Larose Kells, pt. reports thwe results were wnl.   . Anxiety   . Depression   . Asthma     "breathing is a little tighter today"- 01/11/2014    Past Surgical History  Procedure Laterality Date  . Appendectomy  01/1997  . Cesarean section  03/1996  . Tonsillectomy    . Plantar fascitis      right foot  . Pilonidal cyst excision    . Colonoscopy    . Esophagogastroduodenoscopy    . Breast biopsy Right 07/2007    neg    Prior to Admission medications   Medication Sig Start Date End Date Taking? Authorizing Provider  acetaminophen (TYLENOL) 500 MG tablet Take 500 mg by mouth every 6 (six) hours as needed.   Yes Historical Provider, MD  albuterol (VENTOLIN HFA) 108 (90 BASE) MCG/ACT inhaler INHALE 2 PUFFS BY MOUTH EVERY 6 HOURS AS NEEDED FOR COUGH OR WHEEZING Patient taking differently: Inhale 1-2 puffs into the lungs every 6 (six) hours as needed for wheezing or shortness of breath. INHALE 2 PUFFS BY MOUTH EVERY 6 HOURS AS NEEDED FOR COUGH OR WHEEZING 05/25/13  Yes Colon Branch, MD  ALPRAZolam (XANAX) 0.5 MG  tablet TAKE 1 TABLET BY MOUTH THREE TIMES DAILY AS NEEDED Patient taking differently: Take 0.5 mg by mouth 3 (three) times daily as needed for anxiety or sleep. TAKE 1 TABLET BY MOUTH THREE TIMES DAILY AS NEEDED 02/19/13  Yes Colon Branch, MD  cholecalciferol (VITAMIN D) 1000 UNITS tablet Take 2,000 Units by mouth daily.     Yes Historical Provider, MD  hydrochlorothiazide (HYDRODIURIL) 25 MG tablet TAKE 1 TABLET BY MOUTH DAILY   Yes Colon Branch, MD  ibuprofen (ADVIL,MOTRIN) 200 MG tablet Take 200 mg by mouth every 6 (six) hours as needed.   Yes Historical Provider, MD  levonorgestrel (MIRENA) 20 MCG/24HR IUD 1 each by Intrauterine route once.   Yes Historical Provider, MD  Melatonin 5 MG TABS Take 5 mg by mouth at bedtime.   Yes Historical Provider, MD  montelukast (SINGULAIR) 10 MG tablet TAKE 1 TABLET BY MOUTH EVERY NIGHT AT BEDTIME 10/01/13  Yes Colon Branch, MD  omeprazole (PRILOSEC) 40 MG capsule TAKE ONE CAPSULE BY MOUTH EVERY MORNING   Yes Colon Branch, MD  ranitidine (ZANTAC) 300 MG tablet TAKE 1 TABLET BY MOUTH EVERY NIGHT AT BEDTIME 07/17/13  Yes Colon Branch, MD  sertraline (ZOLOFT) 100 MG tablet Take 150 mg by mouth  daily before breakfast.  07/17/13  Yes Historical Provider, MD  SYMBICORT 160-4.5 MCG/ACT inhaler INHALE 2 PUFFS BY MOUTH TWICE DAILY 11/20/13  Yes Colon Branch, MD  VENTOLIN HFA 108 (90 BASE) MCG/ACT inhaler INHALE 2 PUFFS EVERY 6 HOURS AS NEEDED FOR COUGH OR WHEEZING 01/11/14  Yes Colon Branch, MD   No Known Allergies  History  Substance Use Topics  . Smoking status: Former Smoker    Quit date: 02/13/1996  . Smokeless tobacco: Never Used     Comment: used to smoke 2 ppd  . Alcohol Use: 0.0 oz/week    0 Not specified per week     Comment: 2-3 x/week    Family History  Problem Relation Age of Onset  . Melanoma Father   . Coronary artery disease Neg Hx   . Stroke Neg Hx   . Colon cancer Neg Hx   . Breast cancer Neg Hx   . Diabetes Other     cousin  . Hypertension Mother       Review of Systems  Positive ROS: neg  All other systems have been reviewed and were otherwise negative with the exception of those mentioned in the HPI and as above.  Objective: Vital signs in last 24 hours: Temp:  [98.7 F (37.1 C)] 98.7 F (37.1 C) (12/03 1034) Pulse Rate:  [88] 88 (12/03 1034) Resp:  [18] 18 (12/03 1034) BP: (144)/(97) 144/97 mmHg (12/03 1034) SpO2:  [97 %] 97 % (12/03 1034) Weight:  [229 lb (103.874 kg)] 229 lb (103.874 kg) (12/03 1034)  General Appearance: Alert, cooperative, no distress, appears stated age Head: Normocephalic, without obvious abnormality, atraumatic Eyes: PERRL, conjunctiva/corneas clear, EOM's intact    Neck: Supple, symmetrical, trachea midline Back: Symmetric, no curvature, ROM normal, no CVA tenderness Lungs:  respirations unlabored Heart: Regular rate and rhythm Abdomen: Soft, non-tender Extremities: Extremities normal, atraumatic, no cyanosis or edema Pulses: 2+ and symmetric all extremities Skin: Skin color, texture, turgor normal, no rashes or lesions  NEUROLOGIC:   Mental status: Alert and oriented x4,  no aphasia, good attention span, fund of knowledge, and memory Motor Exam - grossly normal Sensory Exam - grossly normal Reflexes: brisk Coordination - grossly normal Gait - spastic Balance - grossly normal Cranial Nerves: I: smell Not tested  II: visual acuity  OS: nl    OD: nl  II: visual fields Full to confrontation  II: pupils Equal, round, reactive to light  III,VII: ptosis None  III,IV,VI: extraocular muscles  Full ROM  V: mastication Normal  V: facial light touch sensation  Normal  V,VII: corneal reflex  Present  VII: facial muscle function - upper  Normal  VII: facial muscle function - lower Normal  VIII: hearing Not tested  IX: soft palate elevation  Normal  IX,X: gag reflex Present  XI: trapezius strength  5/5  XI: sternocleidomastoid strength 5/5  XI: neck flexion strength  5/5  XII: tongue strength   Normal    Data Review Lab Results  Component Value Date   WBC 8.3 01/11/2014   HGB 12.5 01/11/2014   HCT 39.5 01/11/2014   MCV 83.3 01/11/2014   PLT 367 01/11/2014   Lab Results  Component Value Date   NA 138 01/11/2014   K 4.2 01/11/2014   CL 99 01/11/2014   CO2 24 01/11/2014   BUN 20 01/11/2014   CREATININE 0.71 01/11/2014   GLUCOSE 111* 01/11/2014   Lab Results  Component Value Date   INR 0.97 01/11/2014  Assessment/Plan: Patient admitted for laminectomy for intradural extra medullary tumor. Patient has failed a reasonable attempt at conservative therapy.  I explained the condition and procedure to the patient and answered any questions.  Patient wishes to proceed with procedure as planned. Understands risks/ benefits and typical outcomes of procedure.   Leatha Rohner S 01/14/2014 3:01 PM

## 2014-01-14 NOTE — Transfer of Care (Signed)
Immediate Anesthesia Transfer of Care Note  Patient: Vanessa Middleton  Procedure(s) Performed: Procedure(s): CERVICAL LAMINECTOMY FOR TUMOR. CERVICAL SEVEN TO THORACIC THREE FOR INTRADURAL TUMOR (N/A)  Patient Location: PACU  Anesthesia Type:General  Level of Consciousness: awake and oriented  Airway & Oxygen Therapy: Patient Spontanous Breathing and Patient connected to face mask oxygen  Post-op Assessment: Report given to PACU RN and Patient moving all extremities X 4  Post vital signs: Reviewed and stable  Complications: No apparent anesthesia complications

## 2014-01-14 NOTE — Anesthesia Postprocedure Evaluation (Signed)
  Anesthesia Post-op Note  Patient: Vanessa Middleton  Procedure(s) Performed: Procedure(s): CERVICAL LAMINECTOMY FOR TUMOR. CERVICAL SEVEN TO THORACIC THREE FOR INTRADURAL TUMOR (N/A)  Patient Location: PACU  Anesthesia Type: No value filed.   Level of Consciousness: awake, alert  and oriented  Airway and Oxygen Therapy: Patient Spontanous Breathing  Post-op Pain: mild  Post-op Assessment: Post-op Vital signs reviewed  Post-op Vital Signs: Reviewed  Last Vitals:  Filed Vitals:   01/14/14 1925  BP: 161/69  Pulse: 85  Temp:   Resp: 12    Complications: No apparent anesthesia complications

## 2014-01-14 NOTE — Anesthesia Preprocedure Evaluation (Addendum)
Anesthesia Evaluation  Patient identified by MRN, date of birth, ID band Patient awake    Reviewed: Allergy & Precautions, H&P , NPO status , Patient's Chart, lab work & pertinent test results  Airway Mallampati: II  TM Distance: >3 FB Neck ROM: Full    Dental  (+) Teeth Intact, Dental Advisory Given   Pulmonary asthma , former smoker,          Cardiovascular hypertension, Pt. on medications Rhythm:Regular Rate:Normal     Neuro/Psych    GI/Hepatic GERD-  Medicated and Controlled,  Endo/Other    Renal/GU      Musculoskeletal   Abdominal   Peds  Hematology   Anesthesia Other Findings   Reproductive/Obstetrics                           Anesthesia Physical Anesthesia Plan  ASA: II  Anesthesia Plan: General   Post-op Pain Management:    Induction: Intravenous  Airway Management Planned: Oral ETT  Additional Equipment:   Intra-op Plan:   Post-operative Plan: Extubation in OR  Informed Consent: I have reviewed the patients History and Physical, chart, labs and discussed the procedure including the risks, benefits and alternatives for the proposed anesthesia with the patient or authorized representative who has indicated his/her understanding and acceptance.     Plan Discussed with: CRNA and Surgeon  Anesthesia Plan Comments:         Anesthesia Quick Evaluation

## 2014-01-15 ENCOUNTER — Inpatient Hospital Stay (HOSPITAL_COMMUNITY): Payer: BC Managed Care – PPO

## 2014-01-15 ENCOUNTER — Encounter (HOSPITAL_COMMUNITY): Payer: Self-pay | Admitting: Neurological Surgery

## 2014-01-15 MED ORDER — PANTOPRAZOLE SODIUM 40 MG PO TBEC
DELAYED_RELEASE_TABLET | ORAL | Status: AC
Start: 2014-01-15 — End: 2014-01-16
  Filled 2014-01-15: qty 1

## 2014-01-15 MED ORDER — ALBUTEROL SULFATE HFA 108 (90 BASE) MCG/ACT IN AERS: 1.0000 | INHALATION_SPRAY | Freq: Four times a day (QID) | RESPIRATORY_TRACT | Status: DC | PRN

## 2014-01-15 MED ORDER — PANTOPRAZOLE SODIUM 40 MG PO TBEC
40.0000 mg | DELAYED_RELEASE_TABLET | Freq: Every day | ORAL | Status: DC
  Administered 2014-01-15 – 2014-01-17 (×3): 40 mg via ORAL
  Filled 2014-01-15 (×2): qty 1

## 2014-01-15 NOTE — Plan of Care (Signed)
Problem: Acute Rehab PT Goals(only PT should resolve) Goal: Pt Will Transfer Bed To Chair/Chair To Bed With correct body mechanics

## 2014-01-15 NOTE — Evaluation (Signed)
Physical Therapy Evaluation Patient Details Name: Vanessa Middleton MRN: [id number] DOB: Aug 23, 1955 Today's Date: 01/15/2014   History of Present Illness  58 yo female s/p C7-T3 laminectomy for tumor removal. Sensory ataxia rom peripheral neuropathy. PMH: asthma, GERD, HTN, anxiety, depression, plantar fascitis  Clinical Impression  Pt was seen for coverage of evaluation and instruction for safety of movement, positioning and to assess her independence with all mobiltiy.  The plan is for her to go home but needed assistance to get to door of 3 peopledue to buckling of LLE.  Sister shared with PT that pt would be disappointed not to go straight home and discussed her safety.  Will see how she progresses.    Follow Up Recommendations SNF;Supervision/Assistance - 24 hour    Equipment Recommendations  Rolling walker with 5" wheels;3in1 (PT)    Recommendations for Other Services       Precautions / Restrictions Precautions Precautions: Cervical Restrictions Weight Bearing Restrictions: No      Mobility  Bed Mobility               General bed mobility comments: in chair when PT arrived  Transfers Overall transfer level: Needs assistance Equipment used: Rolling walker (2 wheeled) Transfers: Sit to/from [facility] Sit to Stand: Min assist Stand pivot transfers: Min assist       General transfer comment: mod assist to sit down with cues for hand placement  Ambulation/Gait Ambulation/Gait assistance: Mod assist;Min assist;+2 safety/equipment Ambulation Distance (Feet): 26 Feet Assistive device: Rolling walker (2 wheeled);2 person hand held assist Gait Pattern/deviations: Step-through pattern;Decreased step length - right;Decreased step length - left;Decreased stride length;Decreased weight shift to left;Drifts right/left;Wide base of support Gait velocity: reduced Gait velocity interpretation: Below normal speed for age/gender General Gait Details:  unsteady to control LLE with knee weak, sensation reduced over lower L leg and foot.  Stairs            Wheelchair Mobility    Modified Rankin (Stroke Patients Only)       Balance Overall balance assessment: Needs assistance Sitting-balance support: Feet supported;Bilateral upper extremity supported Sitting balance-Leahy Scale: Fair     Standing balance support: Bilateral upper extremity supported Standing balance-Leahy Scale: Poor Standing balance comment: buckling L knee                             Pertinent Vitals/Pain Pain Assessment: Faces Faces Pain Scale: Hurts little more Pain Location: posterior neck Pain Intervention(s): Limited activity within patient's tolerance;Premedicated before session    Home Living Family/patient expects to be discharged to:: Private residence Living Arrangements: Children Available Help at Discharge: Family;Available 24 hours/day Type of Home: Other(Comment) (townhouse) Home Access: Level entry     Home Layout: Two level;Able to live on main level with bedroom/bathroom Home Equipment: [name] - single point Additional Comments: Sister will stay with patient to (A) for as long as needed    Prior Function Level of Independence: Independent         Comments: still working [employer] the home     Hand Dominance   Dominant Hand: Right    Extremity/Trunk Assessment   Upper Extremity Assessment: Overall WFL for tasks assessed           Lower Extremity Assessment: Generalized weakness      Cervical / Trunk Assessment: Other exceptions (new cervical spine surgery)  Communication   Communication: No difficulties  Cognition Arousal/Alertness: Awake/alert Behavior During Therapy: [facility]  for tasks assessed/performed Overall Cognitive Status: Within Functional Limits for tasks assessed                      General Comments General comments (skin integrity, edema, etc.): Pt was seen with family assisting to  instruct them and to talk about ooptions for care.    Exercises        Assessment/Plan    PT Assessment Patient needs continued PT services  PT Diagnosis Difficulty walking   PT Problem List Decreased strength;Decreased range of motion;Decreased activity tolerance;Decreased balance;Decreased mobility;Decreased knowledge of use of DME;Decreased skin integrity;Pain  PT Treatment Interventions Gait training;DME instruction;Functional mobility training;Therapeutic activities;Therapeutic exercise;Balance training;Neuromuscular re-education;Patient/family education   PT Goals (Current goals can be found in the Care Plan section) Acute Rehab PT Goals Patient Stated Goal: to go home PT Goal Formulation: With patient/family Time For Goal Achievement: 01/29/14 Potential to Achieve Goals: Good    Frequency Min 5X/week   Barriers to discharge Decreased caregiver support son there at times but sister is coming to stay temporarily    Co-evaluation               End of Session Equipment Utilized During Treatment: Gait belt;Other (comment) (FWW) Activity Tolerance: Patient limited by fatigue;Patient limited by pain Patient left: in chair;with call bell/phone within reach;with chair alarm set;with family/visitor present Nurse Communication: Mobility status         Time: 6144-3154 PT Time Calculation (min) (ACUTE ONLY): 31 min   Charges:   PT Evaluation $Initial PT Evaluation Tier I: 1 Procedure PT Treatments $Gait Training: 8-22 mins   PT G CodesRamond Dial 2014/02/04, 5:27 PM   Mee Hives, PT MS Acute Rehab Dept. Number: [phone]

## 2014-01-15 NOTE — Progress Notes (Signed)
Occupational Therapy Evaluation Patient Details Name: Vanessa Middleton MRN: [id number] DOB: [birth year] Today's Date: 01/15/2014    History of Present Illness 58 yo female s/p C7-T3 laminectomy for tumor removal. Sensory ataxia rom peripheral neuropathy. PMH: asthma, GERD, HTN, anxiety, depression, plantar fascitis   Clinical Impression   Patient is s/p above surgery resulting in functional limitations due to the deficits listed below (see OT problem list). Pt's sister was present and willing to participate in therapy to learn how to safely care for pt. Pt required min-min guard (A) for transfers and functional mobility during ADLs. Discussed DME options with pt and sister. Pt will benefit from trialing shower seat/bench and AE. Patient will benefit from skilled OT acutely to increase independence and safety with ADLS to allow discharge home.     Follow Up Recommendations  No OT follow up;Supervision/Assistance - 24 hour    Equipment Recommendations  3 in 1 bedside comode;Tub/shower seat;Other (comment) (RW-2 wheeled)    Recommendations for Other Services       Precautions / Restrictions Precautions Precautions: Cervical Restrictions Weight Bearing Restrictions: No      Mobility Bed Mobility               General bed mobility comments: Pt in chair on OT arrival  Transfers Overall transfer level: Needs assistance Equipment used: Rolling walker (2 wheeled) Transfers: Sit to/from Stand Sit to Stand: Min assist         General transfer comment: Min (A) from sister for boost to stand and to stabilize balance/    Balance Overall balance assessment: Needs assistance Sitting-balance support: No upper extremity supported;Feet supported Sitting balance-Leahy Scale: Fair Sitting balance - Comments: Due to incr pain level   Standing balance support: Bilateral upper extremity supported;During functional activity Standing balance-Leahy Scale: Fair Standing balance  comment: L knee buckling. Pt states this is not new. No overt LOB                            ADL Overall ADL's : Needs assistance/impaired     Grooming: Wash/dry hands;Wash/dry face;Oral care;Applying deodorant;Brushing hair;Min guard;Cueing for safety;Standing Grooming Details (indicate cue type and reason): Verbal cues for RW management                 Toilet Transfer: Min guard;Ambulation;BSC;Grab bars;RW;With caregiver independent Cabin crew Details (indicate cue type and reason): Sister provided assistance. Verbal cues for safe hand placement on RW, grab bars, and BSC Toileting- Clothing Manipulation and Hygiene: Minimal assistance;With caregiver independent assisting;Sit to/from stand Toileting - Clothing Manipulation Details (indicate cue type and reason): Sister assisted with clothing manipulation due to pt's incr pain     Functional mobility during ADLs: Min guard;Caregiver able to provide necessary level of assistance;Rolling walker General ADL Comments: Pt's sister stated she wanted to learn how to take care of pt. Had the sister provide (A) with therapist providing instructional cues and safety. Pt required min guard-min (A) for transfers and ambulation for ADLs. Discussed DME options for shower/bathroom with pt and sister.     Vision                     Perception     Praxis      Pertinent Vitals/Pain Pain Assessment: 0-10 Pain Score: 6  Pain Location: neck Pain Descriptors / Indicators: Sore Pain Intervention(s): Limited activity within patient's tolerance;Monitored during session;Repositioned     Hand Dominance Right  Extremity/Trunk Assessment Upper Extremity Assessment Upper Extremity Assessment: Overall WFL for tasks assessed   Lower Extremity Assessment Lower Extremity Assessment: Defer to PT evaluation   Cervical / Trunk Assessment Cervical / Trunk Assessment: Normal   Communication  Communication Communication: No difficulties   Cognition Arousal/Alertness: Awake/alert Behavior During Therapy: WFL for tasks assessed/performed Overall Cognitive Status: Within Functional Limits for tasks assessed                     General Comments       Exercises       Shoulder Instructions      Home Living Family/patient expects to be discharged to:: Private residence Living Arrangements: Children (Son) Available Help at Discharge: Family;Available 24 hours/day Type of Home: Other(Comment) (Townhome) Home Access: Level entry     Home Layout: Two level;Able to live on main level with bedroom/bathroom Alternate Level Stairs-Number of Steps: Flight   Bathroom Shower/Tub: Walk-in shower;Door   [employer] Toilet: Standard     Home Equipment: None   Additional Comments: Sister will stay with patient to (A) for as long as needed      Prior Functioning/Environment Level of Independence: Independent             OT Diagnosis: Generalized weakness;Acute pain   OT Problem List: Decreased strength;Decreased range of motion;Decreased activity tolerance;Impaired balance (sitting and/or standing);Decreased coordination;Decreased safety awareness;Decreased knowledge of use of DME or AE;Decreased knowledge of precautions;Obesity;Pain   OT Treatment/Interventions: Self-care/ADL training;Therapeutic exercise;DME and/or AE instruction;Therapeutic activities;Patient/family education;Balance training    OT Goals(Current goals can be found in the care plan section) Acute Rehab OT Goals Patient Stated Goal: to go home OT Goal Formulation: With patient Time For Goal Achievement: 01/29/14 Potential to Achieve Goals: Good ADL Goals Pt Will Perform Upper Body Bathing: sitting;with modified independence Pt Will Perform Lower Body Bathing: with modified independence;sit to/from stand Pt Will Transfer to Toilet: with modified independence;ambulating;bedside commode Pt Will  Perform Toileting - Clothing Manipulation and hygiene: with modified independence;sit to/from stand Pt Will Perform Tub/Shower Transfer: Shower transfer;with modified independence;ambulating;shower seat;rolling walker Additional ADL Goal #1: Pt will verbalize 3/3 cervical precautions with 100% accuracy prior to discharge.  OT Frequency: Min 2X/week   Barriers to D/C:            Co-evaluation              End of Session Equipment Utilized During Treatment: Gait belt;Rolling walker Nurse Communication: Mobility status;Precautions  Activity Tolerance: Patient tolerated treatment well Patient left: in chair;with call bell/phone within reach;with chair alarm set;with nursing/sitter in room   Time: 4967-5916 OT Time Calculation (min): 27 min Charges:    G-Codes:    Redmond Baseman 07-Feb-2014, 1:25 PM

## 2014-01-15 NOTE — Progress Notes (Signed)
Subjective: Patient reports appropriate neck soreness, no headache, No NTW in arms, moving legs well, some residual NT feet  Objective: Vital signs in last 24 hours: Temp:  [97.5 F (36.4 C)-98.7 F (37.1 C)] 97.8 F (36.6 C) (12/04 0500) Pulse Rate:  [76-94] 76 (12/04 0500) Resp:  [10-19] 18 (12/04 0500) BP: (128-167)/(63-97) 128/63 mmHg (12/04 0500) SpO2:  [93 %-99 %] 99 % (12/04 0500) Weight:  [228 lb 13.4 oz (103.8 kg)-229 lb (103.874 kg)] 228 lb 13.4 oz (103.8 kg) (12/03 2055)  Intake/Output from previous day: 12/03 0730 - 12/04 0729 In: 1503 [I.V.:1503] Out: 1940 [FXTKW:4097; Blood:150] Intake/Output this shift:    Neurologic: Grossly normal to in bed exam  Lab Results: Lab Results  Component Value Date   WBC 8.3 01/11/2014   HGB 12.5 01/11/2014   HCT 39.5 01/11/2014   MCV 83.3 01/11/2014   PLT 367 01/11/2014   Lab Results  Component Value Date   INR 0.97 01/11/2014   BMET Lab Results  Component Value Date   NA 138 01/11/2014   K 4.2 01/11/2014   CL 99 01/11/2014   CO2 24 01/11/2014   GLUCOSE 111* 01/11/2014   BUN 20 01/11/2014   CREATININE 0.71 01/11/2014   CALCIUM 9.1 01/11/2014    Studies/Results: No results found.  Assessment/Plan: Doing very well, mobilize today   LOS: 1 day    Vanessa Middleton S 01/15/2014, 9:59 AM

## 2014-01-16 NOTE — Progress Notes (Signed)
Physical Therapy Treatment Patient Details Name: Vanessa Middleton MRN: [id number] DOB: Aug 30, 1955 Today's Date: 01/18/14    History of Present Illness 58 yo female s/p C7-T3 laminectomy for tumor removal. Sensory ataxia rom peripheral neuropathy. PMH: asthma, GERD, HTN, anxiety, depression, plantar fascitis    PT Comments    Pt made great progress today with mobility with less assistance needed, no knee buckling. Still with some unsteadiness with gait. If pt continues to progress well, she may be best suited for HHPT post acute stay vs SNF recommended at evaluation as she has 24 hour assist set up.   Follow Up Recommendations  SNF;Supervision/Assistance - 24 hour (if continues to progress may be best for HHPT)     Equipment Recommendations  Rolling walker with 5" wheels;3in1 (PT)       Precautions / Restrictions Precautions Precautions: Cervical Restrictions Weight Bearing Restrictions: No    Mobility  Bed Mobility               General bed mobility comments: in chair when PT arrived  Transfers Overall transfer level: Needs assistance Equipment used: Rolling walker (2 wheeled) Transfers: Sit to/from Stand Sit to Stand: Min guard Stand pivot transfers: Min guard       General transfer comment: cues on hand placement for safety with transfers  Ambulation/Gait Ambulation/Gait assistance: Min assist Ambulation Distance (Feet): 50 Feet Assistive device: Rolling walker (2 wheeled) Gait Pattern/deviations: Step-through pattern;Decreased stride length;Shuffle Gait velocity: decreased Gait velocity interpretation: Below normal speed for age/gender General Gait Details: pt with mild unsteadiness/leg weakness. No buckling this session, however visable muscle tremors with fatique.    Stairs            Wheelchair Mobility    Modified Rankin (Stroke Patients Only)          Cognition Arousal/Alertness: Awake/alert Behavior During Therapy: WFL for tasks  assessed/performed Overall Cognitive Status: Within Functional Limits for tasks assessed                       Pertinent Vitals/Pain Pain Assessment: 0-10 Pain Score: 3  Pain Location: posterior neck Pain Descriptors / Indicators: Sore Pain Intervention(s): Limited activity within patient's tolerance;Monitored during session;Premedicated before session     PT Goals (current goals can now be found in the care plan section) Acute Rehab PT Goals Patient Stated Goal: to go home PT Goal Formulation: With patient/family Time For Goal Achievement: 01/29/14 Potential to Achieve Goals: Good Progress towards PT goals: Progressing toward goals    Frequency  Min 5X/week    PT Plan Current plan remains appropriate       End of Session Equipment Utilized During Treatment: Gait belt Activity Tolerance: Patient tolerated treatment well Patient left: in chair;with call bell/phone within reach;with chair alarm set     Time: 4268-3419 PT Time Calculation (min) (ACUTE ONLY): 15 min  Charges:  $Gait Training: 8-22 mins                    G Codes:      Willow Ora 01/18/14, 11:33 AM   Willow Ora, PTA, CLT Acute Rehab Services Office- [phone] 11:35 AM

## 2014-01-16 NOTE — Progress Notes (Signed)
Patient ID: Vanessa Middleton, female   DOB: [birth year], 58 y.o.   MRN: [id number] Afeb, vss No new neuro issues Wound clean and dry. Slowly increasing activity with therapy. Continue present rx, and hopefully d/c in a few days.

## 2014-01-16 NOTE — Plan of Care (Signed)
Problem: Phase I Progression Outcomes Goal: Pain controlled with appropriate interventions Outcome: Completed/Met Date Met:  01/16/14 Goal: OOB as tolerated unless otherwise ordered Outcome: Completed/Met Date Met:  01/16/14 Goal: Log roll for position change Outcome: Completed/Met Date Met:  01/16/14 Goal: Initial discharge plan identified Outcome: Completed/Met Date Met:  01/16/14 Goal: PT/OT consults requested Outcome: Completed/Met Date Met:  01/16/14 Goal: Hemodynamically stable Outcome: Completed/Met Date Met:  01/16/14 Goal: Other Phase I Outcomes/Goals Outcome: Not Applicable Date Met:  75/43/60

## 2014-01-17 NOTE — Progress Notes (Signed)
Subjective: Patient reports disappointed with PT help  Objective: Vital signs in last 24 hours: Temp:  [97.7 F (36.5 C)-99.5 F (37.5 C)] 98.2 F (36.8 C) (12/06 0917) Pulse Rate:  [65-88] 82 (12/06 0917) Resp:  [16-20] 16 (12/06 0917) BP: (131-158)/(50-91) 132/91 mmHg (12/06 0917) SpO2:  [97 %-100 %] 97 % (12/06 0956)  Intake/Output from previous day:   Intake/Output this shift:    Physical Exam: Strength full in both legs.  Dressing CDI  Lab Results: No results for input(s): WBC, HGB, HCT, PLT in the last 72 hours. BMET No results for input(s): NA, K, CL, CO2, GLUCOSE, BUN, CREATININE, CALCIUM in the last 72 hours.  Studies/Results: No results found.  Assessment/Plan: Mobilize today and encourage ambulation.  Patient fell last night, but did not injure herself in process.    LOS: 3 days    Peggyann Shoals, MD 01/17/2014, 12:12 PM

## 2014-01-17 NOTE — Plan of Care (Signed)
Problem: Phase III Progression Outcomes Goal: Pain controlled on oral analgesia Outcome: Completed/Met Date Met:  01/17/14

## 2014-01-17 NOTE — Plan of Care (Signed)
Problem: Phase II Progression Outcomes Goal: Understands assist devices with ambulation Outcome: Completed/Met Date Met:  01/17/14

## 2014-01-17 NOTE — Plan of Care (Signed)
Problem: Phase II Progression Outcomes Goal: Verbalizes of donning/doffing brace Outcome: Not Applicable Date Met:  15/04/13

## 2014-01-17 NOTE — Progress Notes (Signed)
Patient ambulated with this RN's assistance to entire 4n circle.  Patient tolerated well. Kizzie Bane, RN

## 2014-01-17 NOTE — Plan of Care (Signed)
Problem: Phase II Progression Outcomes Goal: Tolerating diet Outcome: Completed/Met Date Met:  01/17/14

## 2014-01-17 NOTE — Progress Notes (Signed)
01/16/14 2325  What Happened  Was fall witnessed? Yes  Who witnessed fall? Elspeth Cho, RN  Patients activity before fall ambulating-unassisted  Point of contact back;buttocks  Was patient injured? Unsure  Follow Up  MD notified Vertell Limber  Time MD notified 2340  Family notified Yes-comment (pt's son at bedside, witnessed fall as well)  Time family notified 2325  Additional tests No  Adult Fall Risk Assessment  Risk Factor Category (scoring not indicated) Not Applicable  Age 58  Fall History: Fall within 6 months prior to admission 5  Elimination; Bowel and/or Urine Incontinence 0  Elimination; Bowel and/or Urine Urgency/Frequency 2  Medications: includes PCA/Opiates, Anti-convulsants, Anti-hypertensives, Diuretics, Hypnotics, Laxatives, Sedatives, and Psychotropics 5  Patient Care Equipment 1  Mobility-Assistance 2  Mobility-Gait 2  Mobility-Sensory Deficit 0  Cognition-Awareness 0  Cognition-Impulsiveness 0  Cognition-Limitations 0  Total Score 17  Patient's Fall Risk High Fall Risk (>13 points)  Adult Fall Risk Interventions  Required Bundle Interventions *See Row Information* High fall risk - low, moderate, and high requirements implemented  Vitals  Temp 98 F (36.7 C)  Temp Source Oral  BP (!) 158/69 mmHg  BP Location Right Arm  BP Method Automatic  Patient Position (if appropriate) Lying  Pulse Rate 81  Pulse Rate Source Dinamap  Resp 18  Oxygen Therapy  SpO2 97 %  O2 Device Room Air  Pain Assessment  Pain Assessment 0-10  Pain Score 5  Pain Type Surgical pain  Pain Location Back  Pain Descriptors / Indicators Aching  Pain Frequency Intermittent  Pain Onset On-going  Pain Intervention(s) Repositioned  Neurological  Neuro (WDL) X  Level of Consciousness Alert  Orientation Level Oriented X4  Cognition Appropriate at baseline;Follows commands;No memory impairment  Speech Clear  Pupil Assessment  Yes  R Pupil Size (mm) 3  R Pupil Shape Round  R Pupil  Reaction Brisk  L Pupil Size (mm) 3  L Pupil Shape Round  L Pupil Reaction Brisk  R Hand Grip Strong  L Hand Grip Strong  R Foot Dorsiflexion Strong  L Foot Dorsiflexion Strong  R Foot Plantar Flexion Strong  L Foot Plantar Flexion Strong  RUE Motor Response Purposeful movement  RUE Sensation Full sensation  RUE Motor Strength 5  LUE Motor Response Purposeful movement  LUE Sensation Full sensation  LUE Motor Strength 5  RLE Motor Response Purposeful movement  RLE Sensation Numbness;Tingling  RLE Motor Strength 4  LLE Motor Response Purposeful movement  LLE Sensation Numbness;Tingling  LLE Motor Strength 4  Musculoskeletal  Musculoskeletal (WDL) X  Assistive Device Front wheel walker  Generalized Weakness Yes  Integumentary  Integumentary (WDL) X  Skin Color Appropriate for ethnicity  Skin Condition Dry  Skin Integrity Surgical Incision (see LDA);Ecchymosis;Other (Comment) (scratch, no break in skin, right forearm)  Ecchymosis Location Arm  Ecchymosis Location Orientation Right;Left  Pain Assessment  Date Pain First Started 01/14/14 (post operative pain, not new)  Result of Injury No  Pain Screening  Clinical Progression Not changed

## 2014-01-17 NOTE — Plan of Care (Signed)
Problem: Phase II Progression Outcomes Goal: Progress activity as tolerated unless otherwise ordered Outcome: Progressing     

## 2014-01-18 MED ORDER — OXYCODONE-ACETAMINOPHEN 5-325 MG PO TABS: 1.0000 | ORAL_TABLET | ORAL | Status: DC | PRN

## 2014-01-18 MED ORDER — METHOCARBAMOL 500 MG PO TABS: 500.0000 mg | ORAL_TABLET | Freq: Four times a day (QID) | ORAL | Status: DC | PRN

## 2014-01-18 NOTE — Progress Notes (Signed)
Physical Therapy Treatment Patient Details Name: Vanessa Middleton MRN: [id number] DOB: [birth year] Today's Date: 01/18/2014    History of Present Illness 58 yo female s/p C7-T3 laminectomy for tumor removal. Sensory ataxia rom peripheral neuropathy. PMH: asthma, GERD, HTN, anxiety, depression, plantar fascitis    PT Comments    Pt has greatly improved since PT evaluation. Pt able to demonstrate safe ambulation with RW (although legs remain weak and fatigue easily). Discussed possible transition from [place] to [place] when she gets stronger. Plan updated below.   Follow Up Recommendations  Home health PT;Supervision for mobility/OOB     Equipment Recommendations  Rolling walker with 5" wheels    Recommendations for Other Services       Precautions / Restrictions Precautions Precautions: Cervical;Fall Precaution Comments: Reviewed precautions Restrictions Weight Bearing Restrictions: No    Mobility  Bed Mobility Overal bed mobility: Modified Independent             General bed mobility comments: to supine and to sitting without cues and correct technique  Transfers Overall transfer level: Needs assistance Equipment used: Rolling walker (2 wheeled) Transfers: Sit to/from Stand Sit to Stand: Min guard         General transfer comment: pt using proper, safe technique with RW; knees remain weak with minguard assist for safety  Ambulation/Gait Ambulation/Gait assistance: Min assist Ambulation Distance (Feet): 140 Feet Assistive device: Rolling walker (2 wheeled) Gait Pattern/deviations: Step-through pattern;Narrow base of support Gait velocity: decreased   General Gait Details: pt able to widen BOS with vc and then maintain; educated on preventing bil knee hyperextension and rationale (pt able to prevent hyperextension 90% of steps afterwards); vc for proximity to RW;    Financial trader Rankin (Stroke Patients Only)        Balance Overall balance assessment: Needs assistance Sitting-balance support: No upper extremity supported;Feet supported Sitting balance-Leahy Scale: Good     Standing balance support: Bilateral upper extremity supported;During functional activity Standing balance-Leahy Scale: Poor Standing balance comment: Knee buckling                    Cognition Arousal/Alertness: Awake/alert Behavior During Therapy: WFL for tasks assessed/performed Overall Cognitive Status: Within Functional Limits for tasks assessed                      Exercises Other Exercises Other Exercises: Pt educated on SMALL bridges with goal to control legs in midline; pt tolerated 5 reps with no incr neck pain; pt had to put feet/knees together to help her stabilize Other Exercises: Pt educated to use legs to push recliner closed (instead of using handle and UE)    General Comments General comments (skin integrity, edema, etc.): Sister present for all session      Pertinent Vitals/Pain Pain Assessment: 0-10 Pain Score: 5  Pain Location: neck Pain Descriptors / Indicators: Tightness Pain Intervention(s): Limited activity within patient's tolerance;Monitored during session;Repositioned    Home Living                      Prior Function            PT Goals (current goals can now be found in the care plan section) Acute Rehab PT Goals Patient Stated Goal: to go home Progress towards PT goals: Progressing toward goals    Frequency  Min 5X/week    PT  Plan Discharge plan needs to be updated    Co-evaluation             End of Session Equipment Utilized During Treatment: Gait belt Activity Tolerance: Patient tolerated treatment well Patient left: in chair;with call bell/phone within reach;with chair alarm set;with family/visitor present     Time: 1246-1315 PT Time Calculation (min) (ACUTE ONLY): 29 min  Charges:  $Gait Training: 8-22 mins $Therapeutic  Exercise: 8-22 mins                    G Codes:      Todd Jelinski 2014/01/19, 1:26 PM Pager [phone]

## 2014-01-18 NOTE — Progress Notes (Signed)
Patient is discharged from room 4N14 at this time. Alert and in stable condition. IV site d/c'd. Instruction read to patient and understanding verbalized. Left unit via wheelchair with all belongings and sister at side.

## 2014-01-18 NOTE — Progress Notes (Signed)
ED CM received a call from [place] on 4N concerning patient being discharged. Shower chair and rolling walker order for discharged. CM spoke with patient by phone regarding recommendation for equipment. Offered choice, [facility] selected, by patient. Explained we would be able to get rolling walker for her for discharge and the shower chair would be delivered to her home. Patient  Agreeable and states, she no longer wait both walker and shower chair can be delivered. Phone and address verified with patient. Referral faxed in to [facility] 336 [phone]. Updated patient and Leeroy Cha that rep from [facility] will contact patient regarding delivery. No further CM needs identified.

## 2014-01-18 NOTE — Progress Notes (Signed)
Occupational Therapy Treatment Patient Details Name: Vanessa Middleton MRN: [id number] DOB: [birth year] Today's Date: 01/18/2014    History of present illness 58 yo female s/p C7-T3 laminectomy for tumor removal. Sensory ataxia rom peripheral neuropathy. PMH: asthma, GERD, HTN, anxiety, depression, plantar fascitis   OT comments  Trialed DME for shower with pt and sister today. Practiced a simulated shower transfer with shower seat and RW with pt's sister independent in [place]. Pt felt comfortable with transfer and DME. Educated pt on AE for LB, but pt is able to reach LB, but felt long-handled sponge would be useful for shower for safety. Pt still demonstrating abnormal gait length, narrowing base of support, and hyperextension during ambulation. Pt had several small LOB while walking to and from rehab gym with min assist and verbal cues provided by pt's sister. Pt's sister is very involved and appears to be able to provide the necessary level of care for pt to d/c home. Will continue to follow acutely to address goals.   Follow Up Recommendations  No OT follow up;Supervision/Assistance - 24 hour    Equipment Recommendations  Tub/shower seat;3 in 1 bedside comode;Other (comment) (RW-2 wheeled)    Recommendations for Other Services      Precautions / Restrictions Precautions Precautions: Cervical;Fall Precaution Comments: Reviewed precautions Restrictions Weight Bearing Restrictions: No       Mobility Bed Mobility               General bed mobility comments: Pt in recliner on OT arrival  Transfers Overall transfer level: Needs assistance Equipment used: Rolling walker (2 wheeled) Transfers: Sit to/from Stand Sit to Stand: Min guard         General transfer comment: Sister providing (A) to stabilize RW and to (A) pt with balance/safety. Verbal cues for hand placement on surfaces (RW, recliner)    Balance Overall balance assessment: Needs assistance Sitting-balance  support: No upper extremity supported;Feet supported Sitting balance-Leahy Scale: Good     Standing balance support: Bilateral upper extremity supported;During functional activity Standing balance-Leahy Scale: Poor Standing balance comment: Knee buckling                   ADL Overall ADL's : Needs assistance/impaired                     Lower Body Dressing: Supervision/safety;Sitting/lateral leans Lower Body Dressing Details (indicate cue type and reason): Pt able to cross ankle over knee to reach LB and to don socks. No AE needed         Tub/ Shower Transfer: Walk-in shower;Minimal assistance;Cueing for sequencing;Ambulation;Shower Technical sales engineer Details (indicate cue type and reason): Min (A) for balance/safety. Functional mobility during ADLs: Minimal assistance;Caregiver able to provide necessary level of assistance;Rolling walker;Cueing for safety General ADL Comments: Pt walking with narrowed base of support, abnormal gait length, and hyperextending knee to prevent buckling. Sister providing cues for pt to widen base of support and to take smaller steps. Sister also providing min guard-min (A) for balance. Pt with small LOB several times walking down hallway to and from rehab gym. Pt able to compensate. Pt also with knee buckling when returning to room (possibly due to fatigue).       Vision                     Perception     Praxis      Cognition   Behavior During Therapy: [facility] for tasks assessed/performed Overall  Cognitive Status: Within Functional Limits for tasks assessed                       Extremity/Trunk Assessment               Exercises     Shoulder Instructions       General Comments      Pertinent Vitals/ Pain       Pain Assessment: 0-10 Pain Score: 5  Pain Location: neck Pain Descriptors / Indicators: Tightness Pain Intervention(s): Monitored during session;Repositioned  Home  Living                                          Prior Functioning/Environment              Frequency Min 2X/week     Progress Toward Goals  OT Goals(current goals can now be found in the care plan section)  Progress towards OT goals: Progressing toward goals  Acute Rehab OT Goals Patient Stated Goal: to go home OT Goal Formulation: With patient Time For Goal Achievement: 01/29/14 Potential to Achieve Goals: Good ADL Goals Pt Will Perform Upper Body Bathing: sitting;with modified independence Pt Will Perform Lower Body Bathing: with modified independence;sit to/from stand Pt Will Transfer to Toilet: with modified independence;ambulating;bedside commode Pt Will Perform Toileting - Clothing Manipulation and hygiene: with modified independence;sit to/from stand Pt Will Perform Tub/Shower Transfer: Shower transfer;with modified independence;ambulating;shower seat;rolling walker Additional ADL Goal #1: Pt will verbalize 3/3 cervical precautions with 100% accuracy prior to discharge.  Plan Discharge plan remains appropriate    Co-evaluation                 End of Session Equipment Utilized During Treatment: Gait belt;Rolling walker   Activity Tolerance Patient tolerated treatment well   Patient Left in chair;with call bell/phone within reach;with chair alarm set   Nurse Communication Mobility status;Precautions        Time: 0768-0881 OT Time Calculation (min): 25 min  Charges:    Redmond Baseman 01/18/2014, 10:39 AM

## 2014-01-18 NOTE — Discharge Summary (Signed)
Physician Discharge Summary  Patient ID: Vanessa Middleton MRN: [id number] DOB/AGE: 57-Jul-1957 57 y.o.  Admit date: 01/14/2014 Discharge date: 01/18/2014  Admission Diagnoses: intradural extramedullary tumor T1    Discharge Diagnoses: same   Discharged Condition: good  Hospital Course: The patient was admitted on 01/14/2014 and taken to the operating room where the patient underwent thoracic lam for tumor. The patient tolerated the procedure well and was taken to the recovery room and then to the floor in stable condition. The hospital course was routine. There were no complications. The wound remained clean dry and intact. Pt had appropriate neck soreness. No complaints of arm or leg pain or new N/T/W. The patient remained afebrile with stable vital signs, and tolerated a regular diet. The patient continued to increase activities, and pain was well controlled with oral pain medications.   Consults: None  Significant Diagnostic Studies:  Results for orders placed or performed during the hospital encounter of 01/11/14  Surgical pcr screen  Result Value Ref Range   MRSA, PCR NEGATIVE NEGATIVE   Staphylococcus aureus POSITIVE (A) NEGATIVE  Basic metabolic panel  Result Value Ref Range   Sodium 138 137 - 147 mEq/L   Potassium 4.2 3.7 - 5.3 mEq/L   Chloride 99 96 - 112 mEq/L   CO2 24 19 - 32 mEq/L   Glucose, Bld 111 (H) 70 - 99 mg/dL   BUN 20 6 - 23 mg/dL   Creatinine, Ser 0.71 0.50 - 1.10 mg/dL   Calcium 9.1 8.4 - 10.5 mg/dL   GFR calc non Af Amer >90 >90 mL/min   GFR calc Af Amer >90 >90 mL/min   Anion gap 15 5 - 15  CBC WITH DIFFERENTIAL  Result Value Ref Range   WBC 8.3 4.0 - 10.5 K/uL   RBC 4.74 3.87 - 5.11 MIL/uL   Hemoglobin 12.5 12.0 - 15.0 g/dL   HCT 39.5 36.0 - 46.0 %   MCV 83.3 78.0 - 100.0 fL   MCH 26.4 26.0 - 34.0 pg   MCHC 31.6 30.0 - 36.0 g/dL   RDW 13.4 11.5 - 15.5 %   Platelets 367 150 - 400 K/uL   Neutrophils Relative % 68 43 - 77 %   Neutro Abs 5.6 1.7 -  7.7 K/uL   Lymphocytes Relative 21 12 - 46 %   Lymphs Abs 1.8 0.7 - 4.0 K/uL   Monocytes Relative 9 3 - 12 %   Monocytes Absolute 0.8 0.1 - 1.0 K/uL   Eosinophils Relative 2 0 - 5 %   Eosinophils Absolute 0.2 0.0 - 0.7 K/uL   Basophils Relative 0 0 - 1 %   Basophils Absolute 0.0 0.0 - 0.1 K/uL  Protime-INR  Result Value Ref Range   Prothrombin Time 13.0 11.6 - 15.2 seconds   INR 0.97 0.00 - 1.49    Chest 2 View  01/11/2014   CLINICAL DATA:  Preoperative respiratory exam for removal of cervicothoracic spinal tumor.  EXAM: CHEST - 2 VIEW  COMPARISON:  08/11/2010  FINDINGS: The heart size and mediastinal contours are within normal limits. Stable calcified granuloma of the right lung. There is no evidence of pulmonary edema, consolidation, pneumothorax or pleural fluid. The visualized skeletal structures are unremarkable.  IMPRESSION: Stable right lung granuloma.  No active disease.   Electronically Signed   By: Aletta Edouard M.D.   On: 01/11/2014 13:46   Mr Brain Wo Contrast  12/20/2013   CLINICAL DATA:  Hyper reflexia.  Loss of balance.  EXAM: MRI HEAD WITHOUT CONTRAST  TECHNIQUE: Multiplanar, multiecho pulse sequences of the brain and surrounding structures were obtained without intravenous contrast.  COMPARISON:  None.  FINDINGS: Calvarium and upper cervical spine: No marrow signal abnormality.  Orbits: No significant findings.  Sinuses: Clear. Mastoid and middle ears are clear.  Brain: No acute abnormality such as acute infarct, hemorrhage, hydrocephalus, or mass lesion. No evidence of large vessel occlusion. No volume loss or signal abnormality in the cortical spinal tracts. There is a thin halo of T2 and FLAIR hyperintensity around the non dilated lateral ventricles, likely present in 2012 (faintly visible around the frontal horns). This and 2 foci of left cerebral white matter FLAIR hyperintensity likely reflect changes of chronic small vessel ischemia in this patient with chronic  hypertension. No typical demyelination changes, notable due to cervical spine findings.  IMPRESSION: 1. No findings in the corticospinal tracts to explain hyperreflexia. 2. Mild white matter disease.   Electronically Signed   By: Jorje Guild M.D.   On: 12/20/2013 20:31   Mr Cervical Spine Wo Contrast  12/20/2013   CLINICAL DATA:  Hyper reflexia.  EXAM: MRI CERVICAL SPINE WITHOUT CONTRAST  TECHNIQUE: Multiplanar, multisequence MR imaging of the cervical spine was performed. No intravenous contrast was administered.  COMPARISON:  None.  FINDINGS: Abnormality within the left spinal canal with extending from the C7-T1 disc to the T2-T3 disc. The discrete signal abnormality has the appearance of a mass lesion, with severe compression of the spinal cord to the right. The abnormality is T2 hyperintense and T1 hypo intense with amorphous T2 hypo intensity centrally. Thecal sac positioning and interface with the cord suggests a intradural, extramedullary mass lesion. The lesion is indeterminate ; orientation and lack of foraminal narrowing argues against a schwannoma, signal intensity and fusiform shape would be unusual for meningioma, internal T2 hypointensity would be unusual for an uncomplicated meningeal cyst (although this raises the possibility of a rare epidermoid cyst). No segmentation anomaly or continuation to the posterior mediastinum. Suspect T2 hyperintensity within the spinal cord below the mass lesion. Gadolinium could not be administered due to lack of physician presence during scanning, diffusion imaging not available on this scanner. These results were called by telephone at the time of interpretation on 12/20/2013 at approximately 8 pm to Dr. Carles Collet , who verbally acknowledged these results. The patient was referred to the [facility] emergency room.  No marrow signal abnormality suggestive of fracture, infection, or neoplasm. There is a small hemangioma within the C3 vertebral body.  Central disc  protrusion at C6-7, contacting the ventral cord. There is a smaller central disc herniation at C5-6, without cord contact.  IMPRESSION: 1. Mass (intrathecal, extramedullary) in the left spinal canal at the level of T1 and T2, with severe spinal cord compression. The lesion is indeterminate, as discussed above. Postcontrast imaging is recommended to differentiate between cystic and solid masses. 2. Possible upper thoracic spinal cord edema.   Electronically Signed   By: Jorje Guild M.D.   On: 12/20/2013 21:54   Mr Cervical Spine W Contrast  12/21/2013   CLINICAL DATA:  Initial evaluation for hyper reflexia. Abnormal mass lesion seen on prior cervical spine MRI performed earlier on the same day.  EXAM: MRI CERVICAL SPINE WITH CONTRAST; MRI THORACIC SPINE WITHOUT AND WITH CONTRAST  TECHNIQUE: Multiplanar and multiecho pulse sequences of the cervical spine, to include the craniocervical junction and cervicothoracic junction, were obtained according to standard protocol with intravenous contrast.; Multiplanar and multiecho pulse sequences  of the thoracic spine were obtained without and with intravenous contrast.  CONTRAST:  Unavailable.  COMPARISON:  Prior noncontrast MRI of the cervical spine performed earlier on the same day.  FINDINGS: The vertebral bodies are normally aligned with preservation of the normal cervical lordosis and thoracic kyphosis. Vertebral body heights are preserved. Small benign hemangioma is noted within the C3, T6, and L1 vertebral bodies. Signal intensity within the vertebral body bone marrow is otherwise normal.  Mild multilevel in the thoracic spine. There is a central disc protrusion at T7-8, abutting the thoracic spinal cord without significant canal stenosis. Smaller disc protrusion is present at T8-9 and T9-10 without significant stenosis. A small left paracentral protrusion present at T10-11 without significant stenosis.  Previously lesion within the left aspect of the thecal sac  again seen extending from the C7-T1 level through the T2-3 level. Again, the location of this abnormality is favored to be intradural and extramedullary in location given its position within the thecal sac an relative compression and association with the adjacent spinal cord. The spinal cord is severely compressed to the right at the level of the mass, measuring approximately in 5 mm and transverse diameter. This lesion measures 4.3 x 1.4 x 1.0 cm (craniocaudad x AP x transverse). This lesion is isointense to the adjacent spinal cord on precontrast T1 weighted imaging, with slightly hyperintense T2/STIR signal intensity, with fairly avid post-contrast enhancement. This lesion does not appear to definitely restrict on diffusion-weighted sequences. Finding is most consistent with a mass lesion, and is not compatible with a benign cystic lesion given the post-contrast enhancement. Again, this lesion is indeterminate, and would be somewhat unusual for a schwannoma given the lack of foraminal involvement, and also unusual for a meningioma given its fairly oblong and fusiform appearance. Metastatic disease is not favored given lack of additional abnormalities within the cervicothoracic spine. Again, there is question of subtle cord edema within the upper thoracic spine.  No other abnormal enhancement identified within the cervical or thoracic spine.  Paraspinous soft tissues are unremarkable.  IMPRESSION: 1. Intradural, extramedullary enhancing mass within the left spinal canal extending from C7-T1 through T2-3 with secondary severe cord compression. This lesion demonstrates avid post-contrast enhancement, and is most compatible with a solid mass. Please see above discussion for full description of these findings. 2. Small degenerative disc bulges at C6-7, T7-8, T8-9, T9-10, and T10-11.   Electronically Signed   By: Jeannine Boga M.D.   On: 12/21/2013 01:31   Mr Thoracic Spine W Wo Contrast  12/21/2013    CLINICAL DATA:  Initial evaluation for hyper reflexia. Abnormal mass lesion seen on prior cervical spine MRI performed earlier on the same day.  EXAM: MRI CERVICAL SPINE WITH CONTRAST; MRI THORACIC SPINE WITHOUT AND WITH CONTRAST  TECHNIQUE: Multiplanar and multiecho pulse sequences of the cervical spine, to include the craniocervical junction and cervicothoracic junction, were obtained according to standard protocol with intravenous contrast.; Multiplanar and multiecho pulse sequences of the thoracic spine were obtained without and with intravenous contrast.  CONTRAST:  Unavailable.  COMPARISON:  Prior noncontrast MRI of the cervical spine performed earlier on the same day.  FINDINGS: The vertebral bodies are normally aligned with preservation of the normal cervical lordosis and thoracic kyphosis. Vertebral body heights are preserved. Small benign hemangioma is noted within the C3, T6, and L1 vertebral bodies. Signal intensity within the vertebral body bone marrow is otherwise normal.  Mild multilevel in the thoracic spine. There is a central disc protrusion at  T7-8, abutting the thoracic spinal cord without significant canal stenosis. Smaller disc protrusion is present at T8-9 and T9-10 without significant stenosis. A small left paracentral protrusion present at T10-11 without significant stenosis.  Previously lesion within the left aspect of the thecal sac again seen extending from the C7-T1 level through the T2-3 level. Again, the location of this abnormality is favored to be intradural and extramedullary in location given its position within the thecal sac an relative compression and association with the adjacent spinal cord. The spinal cord is severely compressed to the right at the level of the mass, measuring approximately in 5 mm and transverse diameter. This lesion measures 4.3 x 1.4 x 1.0 cm (craniocaudad x AP x transverse). This lesion is isointense to the adjacent spinal cord on precontrast T1 weighted  imaging, with slightly hyperintense T2/STIR signal intensity, with fairly avid post-contrast enhancement. This lesion does not appear to definitely restrict on diffusion-weighted sequences. Finding is most consistent with a mass lesion, and is not compatible with a benign cystic lesion given the post-contrast enhancement. Again, this lesion is indeterminate, and would be somewhat unusual for a schwannoma given the lack of foraminal involvement, and also unusual for a meningioma given its fairly oblong and fusiform appearance. Metastatic disease is not favored given lack of additional abnormalities within the cervicothoracic spine. Again, there is question of subtle cord edema within the upper thoracic spine.  No other abnormal enhancement identified within the cervical or thoracic spine.  Paraspinous soft tissues are unremarkable.  IMPRESSION: 1. Intradural, extramedullary enhancing mass within the left spinal canal extending from C7-T1 through T2-3 with secondary severe cord compression. This lesion demonstrates avid post-contrast enhancement, and is most compatible with a solid mass. Please see above discussion for full description of these findings. 2. Small degenerative disc bulges at C6-7, T7-8, T8-9, T9-10, and T10-11.   Electronically Signed   By: Jeannine Boga M.D.   On: 12/21/2013 01:31   Dg C-arm 1-60 Min-no Report  01/15/2014   CLINICAL DATA: laminectomy for tumor removal c7-t3   C-ARM 1-60 MINUTES  Fluoroscopy was utilized by the requesting physician.  No radiographic  interpretation.     Antibiotics:  Anti-infectives    Start     Dose/Rate Route Frequency Ordered Stop   01/15/14 0000  ceFAZolin (ANCEF) IVPB 1 g/50 mL premix     1 g100 mL/hr over 30 Minutes Intravenous Every 8 hours 01/14/14 2041 01/15/14 0820   01/14/14 1717  bacitracin 50,000 Units in sodium chloride irrigation 0.9 % 500 mL irrigation  Status:  Discontinued       As needed 01/14/14 1717 01/14/14 1844   01/14/14 0600   ceFAZolin (ANCEF) IVPB 2 g/50 mL premix     2 g100 mL/hr over 30 Minutes Intravenous On call to O.R. 01/13/14 1547 01/14/14 1556      Discharge Exam: Blood pressure 131/44, pulse 69, temperature 98.2 F (36.8 C), temperature source Oral, resp. rate 18, height 5\' 3"  (1.6 m), weight 228 lb 13.4 oz (103.8 kg), last menstrual period 05/01/2010, SpO2 100 %. Neurologic: Grossly normal Incision CDI  Discharge Medications:     Medication List    TAKE these medications        acetaminophen 500 MG tablet  Commonly known as:  TYLENOL  Take 500 mg by mouth every 6 (six) hours as needed.     albuterol 108 (90 BASE) MCG/ACT inhaler  Commonly known as:  VENTOLIN HFA  INHALE 2 PUFFS BY MOUTH EVERY 6  HOURS AS NEEDED FOR COUGH OR WHEEZING     VENTOLIN HFA 108 (90 BASE) MCG/ACT inhaler  Generic drug:  albuterol  INHALE 2 PUFFS EVERY 6 HOURS AS NEEDED FOR COUGH OR WHEEZING     ALPRAZolam 0.5 MG tablet  Commonly known as:  XANAX  TAKE 1 TABLET BY MOUTH THREE TIMES DAILY AS NEEDED     cholecalciferol 1000 UNITS tablet  Commonly known as:  VITAMIN D  Take 2,000 Units by mouth daily.     hydrochlorothiazide 25 MG tablet  Commonly known as:  HYDRODIURIL  TAKE 1 TABLET BY MOUTH DAILY     ibuprofen 200 MG tablet  Commonly known as:  ADVIL,MOTRIN  Take 200 mg by mouth every 6 (six) hours as needed.     levonorgestrel 20 MCG/24HR IUD  Commonly known as:  MIRENA  1 each by Intrauterine route once.     Melatonin 5 MG Tabs  Take 5 mg by mouth at bedtime.     methocarbamol 500 MG tablet  Commonly known as:  ROBAXIN  Take 1 tablet (500 mg total) by mouth every 6 (six) hours as needed for muscle spasms.     montelukast 10 MG tablet  Commonly known as:  SINGULAIR  TAKE 1 TABLET BY MOUTH EVERY NIGHT AT BEDTIME     omeprazole 40 MG capsule  Commonly known as:  PRILOSEC  TAKE ONE CAPSULE BY MOUTH EVERY MORNING     oxyCODONE-acetaminophen 5-325 MG per tablet  Commonly known as:   PERCOCET/ROXICET  Take 1-2 tablets by mouth every 4 (four) hours as needed for moderate pain.     ranitidine 300 MG tablet  Commonly known as:  ZANTAC  TAKE 1 TABLET BY MOUTH EVERY NIGHT AT BEDTIME     sertraline 100 MG tablet  Commonly known as:  ZOLOFT  Take 150 mg by mouth daily before breakfast.     SYMBICORT 160-4.5 MCG/ACT inhaler  Generic drug:  budesonide-formoterol  INHALE 2 PUFFS BY MOUTH TWICE DAILY        Disposition: home   Final Dx: thoracic lam for tumor      Discharge Instructions    Call MD for:  difficulty breathing, headache or visual disturbances    Complete by:  As directed      Call MD for:  persistant nausea and vomiting    Complete by:  As directed      Call MD for:  redness, tenderness, or signs of infection (pain, swelling, redness, odor or green/yellow discharge around incision site)    Complete by:  As directed      Call MD for:  severe uncontrolled pain    Complete by:  As directed      Call MD for:  temperature >100.4    Complete by:  As directed      Diet - low sodium heart healthy    Complete by:  As directed      Discharge instructions    Complete by:  As directed   No heavy lifting, no driving for 1 week, may shower     Increase activity slowly    Complete by:  As directed      Remove dressing in 24 hours    Complete by:  As directed               Signed: Rivaan Kendall S 01/18/2014, 5:56 PM

## 2014-01-19 ENCOUNTER — Telehealth: Payer: Self-pay

## 2014-01-19 NOTE — Telephone Encounter (Signed)
FYI

## 2014-01-19 NOTE — Telephone Encounter (Signed)
Margo Nurse Case Manager with [facility] [phone]  Audelia Acton called to let us know that she was working with Angelita Ingles on Neurosurgeon.

## 2014-01-26 ENCOUNTER — Encounter: Payer: Self-pay | Admitting: Nurse Practitioner

## 2014-01-26 ENCOUNTER — Ambulatory Visit (INDEPENDENT_AMBULATORY_CARE_PROVIDER_SITE_OTHER): Payer: BC Managed Care – PPO | Admitting: Nurse Practitioner

## 2014-01-26 ENCOUNTER — Telehealth: Payer: Self-pay | Admitting: Internal Medicine

## 2014-01-26 VITALS — BP 135/78 | HR 90 | Temp 97.6°F | Ht 64.0 in | Wt 222.0 lb

## 2014-01-26 DIAGNOSIS — R062 Wheezing: Secondary | ICD-10-CM

## 2014-01-26 DIAGNOSIS — J209 Acute bronchitis, unspecified: Secondary | ICD-10-CM

## 2014-01-26 MED ORDER — GUAIFENESIN ER 600 MG PO TB12: 600.0000 mg | ORAL_TABLET | Freq: Two times a day (BID) | ORAL | Status: DC

## 2014-01-26 MED ORDER — DOXYCYCLINE HYCLATE 100 MG PO TABS: 100.0000 mg | ORAL_TABLET | Freq: Two times a day (BID) | ORAL | Status: DC

## 2014-01-26 MED ORDER — IPRATROPIUM-ALBUTEROL 0.5-2.5 (3) MG/3ML IN SOLN
3.0000 mL | Freq: Once | RESPIRATORY_TRACT | Status: AC
  Administered 2014-01-26: 3 mL via RESPIRATORY_TRACT

## 2014-01-26 MED ORDER — PREDNISONE 10 MG PO TABS: ORAL_TABLET | ORAL | Status: DC

## 2014-01-26 MED ORDER — METHYLPREDNISOLONE ACETATE 40 MG/ML IJ SUSP
40.0000 mg | Freq: Once | INTRAMUSCULAR | Status: AC
  Administered 2014-01-26: 40 mg via INTRAMUSCULAR

## 2014-01-26 NOTE — Telephone Encounter (Signed)
Please call the patient, needs to be seen. I have a 4:15 PM visit, if she is not able to make it needs to go to the urgent care.

## 2014-01-26 NOTE — Progress Notes (Signed)
Subjective:     Vanessa Middleton is a 58 y.o. female presents with SOB for about 3 days, worse yesterday. Hx asthma & recent back surgery. Had f/u w/surgeon today-CXR completed & asked pt to see PCP.  I consulted w/ Dr Ronnald Ramp [facility] [facility])-CXR result: peribronchial thickening, stable nodule RL base, no infiltrate or effusion.   The following portions of the patient's history were reviewed and updated as appropriate: allergies, current medications, past medical history, past social history, past surgical history and problem list.  Review of Systems Constitutional: negative for fevers Ears, nose, mouth, throat, and face: negative for nasal congestion and sore throat Respiratory: positive for cough, wheezing and no sputum Cardiovascular: negative for palpitations Gastrointestinal: negative for abdominal pain, change in bowel habits and diarrhea Musculoskeletal:surgical pain improving-still using pain meds as directed.    Objective:    BP 135/78 mmHg  Pulse 90  Temp(Src) 97.6 F (36.4 C) (Temporal)  Ht 5\' 4"  (1.626 m)  Wt 222 lb (100.699 kg)  BMI 38.09 kg/m2  SpO2 92%  LMP 05/01/2010 BP 135/78 mmHg  Pulse 90  Temp(Src) 97.6 F (36.4 C) (Temporal)  Ht 5\' 4"  (1.626 m)  Wt 222 lb (100.699 kg)  BMI 38.09 kg/m2  SpO2 92%  LMP 05/01/2010 General appearance: alert, cooperative, appears stated age and mild distress Head: Normocephalic, without obvious abnormality, atraumatic Eyes: negative findings: lids and lashes normal, conjunctivae and sclerae normal and wearing glasses Back: upper thoracic surgical incision, healing, scant erythema, no drainage, well approximated. Lungs: diminished breath sounds bilaterally  Better air movement L fields after 1st treatment. Better air movement R fields after 2nd treatment, some wheezes bilat after 2nd treatment, persistently decreased lung sounds in right base  Heart: regular rate and rhythm, S1, S2 normal, no murmur, click, rub or  gallop Skin: multiple petechiae arms    Assessment:   1. Wheezing - ipratropium-albuterol (DUONEB) 0.5-2.5 (3) MG/3ML nebulizer solution 3 mL; Take 3 mLs by nebulization once. - ipratropium-albuterol (DUONEB) 0.5-2.5 (3) MG/3ML nebulizer solution 3 mL; Take 3 mLs by nebulization once. - predniSONE (DELTASONE) 10 MG tablet; Starting 03/26/13, Take 4Tpo qam X 2d, then 3T po qam X 3d, then 2T po qd X 3d, then 1T po qam X 3d.  Dispense: 26 tablet; Refill: 0 - methylPREDNISolone acetate (DEPO-MEDROL) injection 40 mg; Inject 1 mL (40 mg total) into the muscle once. - guaiFENesin (MUCINEX) 600 MG 12 hr tablet; Take 1 tablet (600 mg total) by mouth 2 (two) times daily.  Dispense: 30 tablet; Refill: 0 Dr Ronnald Ramp is OK w/giving depo medrol in ofc & starting prednisone taper tomorrow, waiting 24 hrs for response. If not feeling better order chest angiogram to r/o PE.   2. Acute bronchitis, unspecified organism - doxycycline (VIBRA-TABS) 100 MG tablet; Take 1 tablet (100 mg total) by mouth 2 (two) times daily.  Dispense: 20 tablet; Refill: 0 - guaiFENesin (MUCINEX) 600 MG 12 hr tablet; Take 1 tablet (600 mg total) by mouth 2 (two) times daily.  Dispense: 30 tablet; Refill: 0  F/u 3 days or sooner if no improvement Needs chest angiogram if no improvement w/steroids in 24 hrs.

## 2014-01-26 NOTE — Telephone Encounter (Signed)
FYI. Please advise.

## 2014-01-26 NOTE — Telephone Encounter (Signed)
Noted  

## 2014-01-26 NOTE — Telephone Encounter (Signed)
Caller name: Glenard Haring Relation to [id number]/ Dr. Sherley Bounds Neuro  Call back number: [phone] Pharmacy:  Reason for call:  Pt is exp asthma for the last 2 days Dr. Sherley Bounds neuro wanted to make Dr. Larose Kells aware. Neuro expressed patient needs to be seen today advised pt MD is completely book and suggested another physician RN insisted a follow up to discuss patient.

## 2014-01-26 NOTE — Telephone Encounter (Signed)
Can you call Pt and see if she can be seen today at 4:15, okay by Dr. Larose Kells, if she can't she needs to go to urgent care.

## 2014-01-26 NOTE — Telephone Encounter (Signed)
Pt was scheduled to see Dr. Hinda Glatter today due to her asthma transferred patient to [facility].

## 2014-01-26 NOTE — Progress Notes (Signed)
Pre visit review using our clinic review tool, if applicable. No additional management support is needed unless otherwise documented below in the visit note. 

## 2014-01-26 NOTE — Patient Instructions (Signed)
Start antibiotic today. Eat yogurt daily to help prevent antibiotic -associated diarrhea  Use albuterol inhaler at least twice daily, every 6hours if needed.  Take mucinex daily.  Start prednisone tomorrow.  If no improvement in cough & shortness of breath, by tomorrow at lunch, call my office, we will schedule CT scan of chest.

## 2014-01-26 NOTE — Telephone Encounter (Signed)
Pt has appt with Dr. Kathlen Mody at [facility] location at 1:45 for asthma symptoms.

## 2014-01-27 ENCOUNTER — Emergency Department (HOSPITAL_COMMUNITY): Payer: BC Managed Care – PPO

## 2014-01-27 ENCOUNTER — Inpatient Hospital Stay (HOSPITAL_COMMUNITY)
Admission: EM | Admit: 2014-01-27 | Discharge: 2014-01-29 | DRG: 189 | Disposition: A | Discharge status: Home / Self Care | Payer: BC Managed Care – PPO | Attending: Internal Medicine | Admitting: Internal Medicine

## 2014-01-27 ENCOUNTER — Encounter (HOSPITAL_COMMUNITY): Payer: Self-pay | Admitting: Emergency Medicine

## 2014-01-27 DIAGNOSIS — Z87891 Personal history of nicotine dependence: Secondary | ICD-10-CM

## 2014-01-27 DIAGNOSIS — R079 Chest pain, unspecified: Secondary | ICD-10-CM | POA: Diagnosis present

## 2014-01-27 DIAGNOSIS — F32A Depression, unspecified: Secondary | ICD-10-CM

## 2014-01-27 DIAGNOSIS — Z6838 Body mass index (BMI) 38.0-38.9, adult: Secondary | ICD-10-CM | POA: Diagnosis not present

## 2014-01-27 DIAGNOSIS — J45902 Unspecified asthma with status asthmaticus: Secondary | ICD-10-CM | POA: Insufficient documentation

## 2014-01-27 DIAGNOSIS — R0602 Shortness of breath: Secondary | ICD-10-CM | POA: Diagnosis not present

## 2014-01-27 DIAGNOSIS — K219 Gastro-esophageal reflux disease without esophagitis: Secondary | ICD-10-CM | POA: Diagnosis present

## 2014-01-27 DIAGNOSIS — D72829 Elevated white blood cell count, unspecified: Secondary | ICD-10-CM | POA: Diagnosis present

## 2014-01-27 DIAGNOSIS — J690 Pneumonitis due to inhalation of food and vomit: Secondary | ICD-10-CM | POA: Diagnosis present

## 2014-01-27 DIAGNOSIS — L309 Dermatitis, unspecified: Secondary | ICD-10-CM

## 2014-01-27 DIAGNOSIS — I10 Essential (primary) hypertension: Secondary | ICD-10-CM | POA: Diagnosis present

## 2014-01-27 DIAGNOSIS — Z0000 Encounter for general adult medical examination without abnormal findings: Secondary | ICD-10-CM

## 2014-01-27 DIAGNOSIS — D649 Anemia, unspecified: Secondary | ICD-10-CM | POA: Diagnosis present

## 2014-01-27 DIAGNOSIS — L821 Other seborrheic keratosis: Secondary | ICD-10-CM

## 2014-01-27 DIAGNOSIS — J4542 Moderate persistent asthma with status asthmaticus: Secondary | ICD-10-CM

## 2014-01-27 DIAGNOSIS — R7303 Prediabetes: Secondary | ICD-10-CM

## 2014-01-27 DIAGNOSIS — J189 Pneumonia, unspecified organism: Secondary | ICD-10-CM | POA: Diagnosis present

## 2014-01-27 DIAGNOSIS — F419 Anxiety disorder, unspecified: Secondary | ICD-10-CM | POA: Diagnosis present

## 2014-01-27 DIAGNOSIS — D497 Neoplasm of unspecified behavior of endocrine glands and other parts of nervous system: Secondary | ICD-10-CM

## 2014-01-27 DIAGNOSIS — R42 Dizziness and giddiness: Secondary | ICD-10-CM

## 2014-01-27 DIAGNOSIS — Z794 Long term (current) use of insulin: Secondary | ICD-10-CM | POA: Diagnosis not present

## 2014-01-27 DIAGNOSIS — J45901 Unspecified asthma with (acute) exacerbation: Secondary | ICD-10-CM | POA: Diagnosis present

## 2014-01-27 DIAGNOSIS — J45909 Unspecified asthma, uncomplicated: Secondary | ICD-10-CM | POA: Diagnosis present

## 2014-01-27 DIAGNOSIS — L570 Actinic keratosis: Secondary | ICD-10-CM

## 2014-01-27 DIAGNOSIS — R0600 Dyspnea, unspecified: Secondary | ICD-10-CM

## 2014-01-27 DIAGNOSIS — J9601 Acute respiratory failure with hypoxia: Secondary | ICD-10-CM | POA: Diagnosis not present

## 2014-01-27 DIAGNOSIS — F329 Major depressive disorder, single episode, unspecified: Secondary | ICD-10-CM | POA: Diagnosis present

## 2014-01-27 DIAGNOSIS — J441 Chronic obstructive pulmonary disease with (acute) exacerbation: Secondary | ICD-10-CM | POA: Diagnosis present

## 2014-01-27 HISTORY — DX: Pneumonia, unspecified organism: J18.9

## 2014-01-27 LAB — BLOOD GAS, ARTERIAL
Acid-Base Excess: 0 mmol/L (ref 0.0–2.0)
Bicarbonate: 24 mEq/L (ref 20.0–24.0)
Drawn by: 257081
O2 CONTENT: 4 L/min
O2 SAT: 95.6 %
PATIENT TEMPERATURE: 98.1
TCO2: 25.2 mmol/L (ref 0–100)
pCO2 arterial: 37.7 mmHg (ref 35.0–45.0)
pH, Arterial: 7.419 (ref 7.350–7.450)
pO2, Arterial: 73.3 mmHg — ABNORMAL LOW (ref 80.0–100.0)

## 2014-01-27 LAB — COMPREHENSIVE METABOLIC PANEL
ALT: 46 U/L — ABNORMAL HIGH (ref 0–35)
AST: 19 U/L (ref 0–37)
Albumin: 3.4 g/dL — ABNORMAL LOW (ref 3.5–5.2)
Alkaline Phosphatase: 142 U/L — ABNORMAL HIGH (ref 39–117)
Anion gap: 18 — ABNORMAL HIGH (ref 5–15)
BUN: 10 mg/dL (ref 6–23)
CALCIUM: 9.6 mg/dL (ref 8.4–10.5)
CO2: 24 meq/L (ref 19–32)
CREATININE: 0.58 mg/dL (ref 0.50–1.10)
Chloride: 93 mEq/L — ABNORMAL LOW (ref 96–112)
GFR calc non Af Amer: >90 mL/min (ref >90)
Glucose, Bld: 292 mg/dL — ABNORMAL HIGH (ref 70–99)
Potassium: 3.2 mEq/L — ABNORMAL LOW (ref 3.7–5.3)
Sodium: 135 mEq/L — ABNORMAL LOW (ref 137–147)
TOTAL PROTEIN: 7.5 g/dL (ref 6.0–8.3)
Total Bilirubin: 0.3 mg/dL (ref 0.3–1.2)

## 2014-01-27 LAB — CBC WITH DIFFERENTIAL/PLATELET
Basophils Absolute: 0 10*3/uL (ref 0.0–0.1)
Basophils Absolute: 0 10*3/uL (ref 0.0–0.1)
Basophils Relative: 0 % (ref 0–1)
Basophils Relative: 0 % (ref 0–1)
EOS PCT: 1 % (ref 0–5)
Eosinophils Absolute: 0.2 10*3/uL (ref 0.0–0.7)
Eosinophils Absolute: 0 10*3/uL (ref 0.0–0.7)
Eosinophils Relative: 0 % (ref 0–5)
HCT: 33.5 % — ABNORMAL LOW (ref 36.0–46.0)
HCT: 34.3 % — ABNORMAL LOW (ref 36.0–46.0)
HEMOGLOBIN: 10.6 g/dL — AB (ref 12.0–15.0)
HEMOGLOBIN: 10.9 g/dL — AB (ref 12.0–15.0)
LYMPHS ABS: 3.4 10*3/uL (ref 0.7–4.0)
LYMPHS ABS: 0.4 10*3/uL — AB (ref 0.7–4.0)
LYMPHS PCT: 3 % — AB (ref 12–46)
Lymphocytes Relative: 16 % (ref 12–46)
MCH: 26.4 pg (ref 26.0–34.0)
MCH: 27 pg (ref 26.0–34.0)
MCHC: 31.6 g/dL (ref 30.0–36.0)
MCHC: 31.8 g/dL (ref 30.0–36.0)
MCV: 83.3 fL (ref 78.0–100.0)
MCV: 85.1 fL (ref 78.0–100.0)
MONOS PCT: 10 % (ref 3–12)
Monocytes Absolute: 2.1 10*3/uL — ABNORMAL HIGH (ref 0.1–1.0)
Monocytes Absolute: 0.4 10*3/uL (ref 0.1–1.0)
Monocytes Relative: 2 % — ABNORMAL LOW (ref 3–12)
NEUTROS PCT: 73 % (ref 43–77)
NEUTROS PCT: 95 % — AB (ref 43–77)
Neutro Abs: 15.1 10*3/uL — ABNORMAL HIGH (ref 1.7–7.7)
Neutro Abs: 16.3 10*3/uL — ABNORMAL HIGH (ref 1.7–7.7)
PLATELETS: 384 10*3/uL (ref 150–400)
Platelets: 389 10*3/uL (ref 150–400)
RBC: 4.02 MIL/uL (ref 3.87–5.11)
RBC: 4.03 MIL/uL (ref 3.87–5.11)
RDW: 14.1 % (ref 11.5–15.5)
RDW: 14.3 % (ref 11.5–15.5)
WBC: 20.8 10*3/uL — ABNORMAL HIGH (ref 4.0–10.5)
WBC: 17.1 10*3/uL — AB (ref 4.0–10.5)

## 2014-01-27 LAB — BASIC METABOLIC PANEL
Anion gap: 16 — ABNORMAL HIGH (ref 5–15)
BUN: 10 mg/dL (ref 6–23)
CALCIUM: 9.1 mg/dL (ref 8.4–10.5)
CO2: 24 mEq/L (ref 19–32)
CREATININE: 0.62 mg/dL (ref 0.50–1.10)
Chloride: 90 mEq/L — ABNORMAL LOW (ref 96–112)
GLUCOSE: 156 mg/dL — AB (ref 70–99)
Potassium: 3.6 mEq/L — ABNORMAL LOW (ref 3.7–5.3)
Sodium: 130 mEq/L — ABNORMAL LOW (ref 137–147)

## 2014-01-27 LAB — MRSA PCR SCREENING: MRSA by PCR: NEGATIVE

## 2014-01-27 LAB — GLUCOSE, CAPILLARY
GLUCOSE-CAPILLARY: 257 mg/dL — AB (ref 70–99)
GLUCOSE-CAPILLARY: 145 mg/dL — AB (ref 70–99)
Glucose-Capillary: 283 mg/dL — ABNORMAL HIGH (ref 70–99)
Glucose-Capillary: 148 mg/dL — ABNORMAL HIGH (ref 70–99)

## 2014-01-27 LAB — INFLUENZA PANEL BY PCR (TYPE A & B)
H1N1FLUPCR: NOT DETECTED
INFLAPCR: NEGATIVE
INFLBPCR: NEGATIVE

## 2014-01-27 LAB — PRO B NATRIURETIC PEPTIDE: Pro B Natriuretic peptide (BNP): 141.5 pg/mL — ABNORMAL HIGH (ref 0–125)

## 2014-01-27 LAB — TROPONIN I
Troponin I: <0.3 ng/mL (ref <0.30)
Troponin I: <0.3 ng/mL (ref <0.30)

## 2014-01-27 LAB — LACTIC ACID, PLASMA: Lactic Acid, Venous: 3.8 mmol/L — ABNORMAL HIGH (ref 0.5–2.2)

## 2014-01-27 MED ORDER — VANCOMYCIN HCL 10 G IV SOLR
1250.0000 mg | Freq: Two times a day (BID) | INTRAVENOUS | Status: DC
  Administered 2014-01-27: 1250 mg via INTRAVENOUS
  Filled 2014-01-27 (×2): qty 1250

## 2014-01-27 MED ORDER — ALBUTEROL SULFATE (2.5 MG/3ML) 0.083% IN NEBU: 2.5000 mg | INHALATION_SOLUTION | Freq: Four times a day (QID) | RESPIRATORY_TRACT | Status: DC

## 2014-01-27 MED ORDER — DEXTROSE 5 % IV SOLN
1.0000 g | Freq: Three times a day (TID) | INTRAVENOUS | Status: DC
  Administered 2014-01-27: 1 g via INTRAVENOUS
  Filled 2014-01-27 (×2): qty 1

## 2014-01-27 MED ORDER — FAMOTIDINE 20 MG PO TABS
20.0000 mg | ORAL_TABLET | Freq: Every day | ORAL | Status: DC
  Administered 2014-01-27 – 2014-01-28 (×2): 20 mg via ORAL
  Filled 2014-01-27 (×2): qty 1

## 2014-01-27 MED ORDER — MONTELUKAST SODIUM 10 MG PO TABS
10.0000 mg | ORAL_TABLET | Freq: Every day | ORAL | Status: DC
  Administered 2014-01-27 – 2014-01-28 (×2): 10 mg via ORAL
  Filled 2014-01-27 (×2): qty 1

## 2014-01-27 MED ORDER — OXYCODONE-ACETAMINOPHEN 5-325 MG PO TABS
1.0000 | ORAL_TABLET | ORAL | Status: DC | PRN
  Administered 2014-01-28 (×2): 1 via ORAL
  Administered 2014-01-29: 2 via ORAL
  Filled 2014-01-27 (×3): qty 1

## 2014-01-27 MED ORDER — ALBUTEROL (5 MG/ML) CONTINUOUS INHALATION SOLN
10.0000 mg/h | INHALATION_SOLUTION | Freq: Once | RESPIRATORY_TRACT | Status: AC
  Administered 2014-01-27: 10 mg/h via RESPIRATORY_TRACT

## 2014-01-27 MED ORDER — GUAIFENESIN ER 600 MG PO TB12: 600.0000 mg | ORAL_TABLET | Freq: Two times a day (BID) | ORAL | Status: DC

## 2014-01-27 MED ORDER — ACETAMINOPHEN 650 MG RE SUPP: 650.0000 mg | Freq: Four times a day (QID) | RECTAL | Status: DC | PRN

## 2014-01-27 MED ORDER — ALBUTEROL SULFATE (2.5 MG/3ML) 0.083% IN NEBU
2.5000 mg | INHALATION_SOLUTION | RESPIRATORY_TRACT | Status: DC
Start: 2014-01-27 — End: 2014-01-27
  Administered 2014-01-27: 2.5 mg via RESPIRATORY_TRACT
  Filled 2014-01-27: qty 3

## 2014-01-27 MED ORDER — ENOXAPARIN SODIUM 40 MG/0.4ML ~~LOC~~ SOLN
40.0000 mg | SUBCUTANEOUS | Status: DC
  Administered 2014-01-27 – 2014-01-29 (×3): 40 mg via SUBCUTANEOUS
  Filled 2014-01-27 (×3): qty 0.4

## 2014-01-27 MED ORDER — MELATONIN 5 MG PO TABS: 5.0000 mg | ORAL_TABLET | Freq: Every day | ORAL | Status: DC | PRN

## 2014-01-27 MED ORDER — IPRATROPIUM BROMIDE 0.02 % IN SOLN
0.5000 mg | Freq: Four times a day (QID) | RESPIRATORY_TRACT | Status: DC
  Administered 2014-01-27: 0.5 mg via RESPIRATORY_TRACT
  Filled 2014-01-27: qty 2.5

## 2014-01-27 MED ORDER — CEFEPIME HCL 2 G IJ SOLR
2.0000 g | Freq: Once | INTRAMUSCULAR | Status: AC
  Administered 2014-01-27: 2 g via INTRAVENOUS
  Filled 2014-01-27: qty 2

## 2014-01-27 MED ORDER — LEVOFLOXACIN 500 MG PO TABS
500.0000 mg | ORAL_TABLET | Freq: Every day | ORAL | Status: DC
  Administered 2014-01-27 – 2014-01-29 (×3): 500 mg via ORAL
  Filled 2014-01-27 (×3): qty 1

## 2014-01-27 MED ORDER — ONDANSETRON HCL 4 MG/2ML IJ SOLN: 4.0000 mg | Freq: Four times a day (QID) | INTRAMUSCULAR | Status: DC | PRN

## 2014-01-27 MED ORDER — ALBUTEROL SULFATE (2.5 MG/3ML) 0.083% IN NEBU: 2.5000 mg | INHALATION_SOLUTION | RESPIRATORY_TRACT | Status: DC | PRN

## 2014-01-27 MED ORDER — HYDROCHLOROTHIAZIDE 25 MG PO TABS
25.0000 mg | ORAL_TABLET | Freq: Every day | ORAL | Status: DC
  Administered 2014-01-27 – 2014-01-29 (×3): 25 mg via ORAL
  Filled 2014-01-27 (×3): qty 1

## 2014-01-27 MED ORDER — ALBUTEROL (5 MG/ML) CONTINUOUS INHALATION SOLN
10.0000 mg/h | INHALATION_SOLUTION | Freq: Once | RESPIRATORY_TRACT | Status: AC
  Administered 2014-01-27: 10 mg/h via RESPIRATORY_TRACT
  Filled 2014-01-27: qty 20

## 2014-01-27 MED ORDER — ACETAMINOPHEN 325 MG PO TABS: 650.0000 mg | ORAL_TABLET | Freq: Four times a day (QID) | ORAL | Status: DC | PRN

## 2014-01-27 MED ORDER — METHYLPREDNISOLONE SODIUM SUCC 125 MG IJ SOLR
125.0000 mg | Freq: Once | INTRAMUSCULAR | Status: DC
Start: 2014-01-27 — End: 2014-01-27
  Filled 2014-01-27: qty 2

## 2014-01-27 MED ORDER — TIZANIDINE HCL 4 MG PO TABS
4.0000 mg | ORAL_TABLET | Freq: Three times a day (TID) | ORAL | Status: DC | PRN
  Administered 2014-01-27 – 2014-01-28 (×4): 4 mg via ORAL
  Filled 2014-01-27 (×5): qty 1

## 2014-01-27 MED ORDER — ALPRAZOLAM 0.5 MG PO TABS
0.5000 mg | ORAL_TABLET | Freq: Three times a day (TID) | ORAL | Status: DC | PRN
Start: 2014-01-27 — End: 2014-01-29

## 2014-01-27 MED ORDER — SERTRALINE HCL 50 MG PO TABS
150.0000 mg | ORAL_TABLET | Freq: Every day | ORAL | Status: DC
  Administered 2014-01-27 – 2014-01-29 (×3): 150 mg via ORAL
  Filled 2014-01-27 (×6): qty 1

## 2014-01-27 MED ORDER — METHYLPREDNISOLONE SODIUM SUCC 40 MG IJ SOLR
40.0000 mg | Freq: Two times a day (BID) | INTRAMUSCULAR | Status: DC
  Administered 2014-01-27 – 2014-01-28 (×4): 40 mg via INTRAVENOUS
  Filled 2014-01-27 (×4): qty 1

## 2014-01-27 MED ORDER — ONDANSETRON HCL 4 MG PO TABS: 4.0000 mg | ORAL_TABLET | Freq: Four times a day (QID) | ORAL | Status: DC | PRN

## 2014-01-27 MED ORDER — VITAMIN D 1000 UNITS PO TABS
2000.0000 [IU] | ORAL_TABLET | Freq: Every day | ORAL | Status: DC
  Administered 2014-01-27 – 2014-01-29 (×3): 2000 [IU] via ORAL
  Filled 2014-01-27 (×3): qty 2

## 2014-01-27 MED ORDER — SODIUM CHLORIDE 0.9 % IJ SOLN: 3.0000 mL | Freq: Two times a day (BID) | INTRAMUSCULAR | Status: DC

## 2014-01-27 MED ORDER — IOHEXOL 350 MG/ML SOLN: 100.0000 mL | Freq: Once | INTRAVENOUS | Status: DC | PRN

## 2014-01-27 MED ORDER — IPRATROPIUM-ALBUTEROL 0.5-2.5 (3) MG/3ML IN SOLN
3.0000 mL | Freq: Four times a day (QID) | RESPIRATORY_TRACT | Status: DC
  Administered 2014-01-27 – 2014-01-29 (×8): 3 mL via RESPIRATORY_TRACT
  Filled 2014-01-27 (×8): qty 3

## 2014-01-27 MED ORDER — PANTOPRAZOLE SODIUM 40 MG PO TBEC
40.0000 mg | DELAYED_RELEASE_TABLET | Freq: Every day | ORAL | Status: DC
  Administered 2014-01-27 – 2014-01-29 (×3): 40 mg via ORAL
  Filled 2014-01-27 (×3): qty 1

## 2014-01-27 MED ORDER — SODIUM CHLORIDE 0.9 % IJ SOLN
3.0000 mL | Freq: Two times a day (BID) | INTRAMUSCULAR | Status: DC
  Administered 2014-01-27 – 2014-01-29 (×5): 3 mL via INTRAVENOUS

## 2014-01-27 MED ORDER — BUDESONIDE 0.25 MG/2ML IN SUSP
0.2500 mg | Freq: Two times a day (BID) | RESPIRATORY_TRACT | Status: DC
  Administered 2014-01-27 – 2014-01-29 (×5): 0.25 mg via RESPIRATORY_TRACT
  Filled 2014-01-27 (×5): qty 2

## 2014-01-27 MED ORDER — OXYCODONE-ACETAMINOPHEN 5-325 MG PO TABS
1.0000 | ORAL_TABLET | ORAL | Status: DC | PRN
  Administered 2014-01-27 (×2): 2 via ORAL
  Administered 2014-01-27 – 2014-01-28 (×3): 1 via ORAL
  Filled 2014-01-27 (×2): qty 2
  Filled 2014-01-27 (×2): qty 1
  Filled 2014-01-27: qty 2

## 2014-01-27 MED ORDER — INSULIN ASPART 100 UNIT/ML ~~LOC~~ SOLN
0.0000 [IU] | Freq: Three times a day (TID) | SUBCUTANEOUS | Status: DC
  Administered 2014-01-27: 5 [IU] via SUBCUTANEOUS
  Administered 2014-01-27: 1 [IU] via SUBCUTANEOUS
  Administered 2014-01-27: 5 [IU] via SUBCUTANEOUS
  Administered 2014-01-28 – 2014-01-29 (×3): 2 [IU] via SUBCUTANEOUS
  Administered 2014-01-29: 1 [IU] via SUBCUTANEOUS

## 2014-01-27 NOTE — ED Notes (Signed)
Discussed temp of 98.2 and diaphoretic skin with Dr. Jeneen Rinks. MD acknowledges, goes over plan of care with the patient.

## 2014-01-27 NOTE — ED Notes (Signed)
Dr. James at the bedside.  

## 2014-01-27 NOTE — Progress Notes (Signed)
8:00 AM I agree with HPI/GPe and A/P per Dr. Hal Hope   58 ? 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 [saw Dr. 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 Her asthma sounds like mild intermittent at baseline-not sure if she has ever had OPFt's int he past  Patient Active Problem List   Diagnosis Date Noted  . Status asthmaticus 01/27/2014  . Acute respiratory failure with hypoxia 01/27/2014  . Asthma exacerbation 01/27/2014  . Pneumonia, organism unspecified 01/27/2014  . Essential hypertension 01/27/2014  . Leucocytosis 01/27/2014  . 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 Dizziness 11/11/2013  . Lichenoid keratosis 27/07/2374  . Dermatitis 12/09/2012  . Anxiety and depression 10/29/2011  . Annual physical exam 11/14/2010  . OBESITY 11/15/2009  . OTHER DYSPHAGIA 11/15/2009  . ESSENTIAL HYPERTENSION 11/04/2009  . GERD 12/07/2008  . Prediabetes 12/07/2008  . ABNORMAL CHEST XRAY 12/07/2008  . Personal history of colonic adenomas 11/23/2007  . ASTHMA 06/09/2007  . KNEE PAIN, LEFT 11/14/2006   Agree with plan per [facility] medicine and will narrow Abx to levaquin [not sure if needed as primary asthma ] Will need  PFT's to delineate exact pathology-per pulmonary when to do  Morbid obesity, Body mass index is 38.98 kg/(m^2). may play a role in her restrictive lung disease  Consider quick taper from solumedrol40 bid to PO rpednisone in am 40 mg ECHO is pending, follow 12/176 am CXR  Verneita Griffes, MD [facility] [phone]

## 2014-01-27 NOTE — ED Notes (Signed)
Called main lab for pending lab results: bnp, bmp and troponin

## 2014-01-27 NOTE — Progress Notes (Signed)
PHARMACIST - PHYSICIAN ORDER COMMUNICATION  CONCERNING: P&T Medication Policy on Herbal Medications  DESCRIPTION:  This patient's order for:  Melatonin 5mg    has been noted.  This product(s) is classified as an "herbal" or natural product. Due to a lack of definitive safety studies or FDA approval, nonstandard manufacturing practices, plus the potential risk of unknown drug-drug interactions while on inpatient medications, the Pharmacy and Therapeutics Committee does not permit the use of "herbal" or natural products of this type within [facility].   ACTION TAKEN: The pharmacy department is unable to verify this order at this time and your patient has been informed of this safety policy. Please reevaluate patient's clinical condition at discharge and address if the herbal or natural product(s) should be resumed at that time.

## 2014-01-27 NOTE — ED Notes (Signed)
Per EMS, the patient complains of difficulty breathing. She was seen at her primary office today for possible bronchitis. Hx of asthma. She was given a breathing treatment and steroids and primary office, but when at home shortness of breath became unbearable. 20g present in right hand, EMS provided 15mg  total of albuterol, 125mg  of solumedrol, and 1mg  of atrovent.

## 2014-01-27 NOTE — ED Notes (Signed)
Reported pain to Dr. Jeneen Rinks, MD acknowledges.

## 2014-01-27 NOTE — ED Notes (Signed)
Notified respiratory therapy of new neb treatment order.

## 2014-01-27 NOTE — Consult Note (Signed)
Name: Vanessa Middleton MRN: [id number] DOB: [birth year]    ADMISSION DATE:  01/27/2014 CONSULTATION DATE: 12/16  REFERRING MD :  [facility]  CHIEF COMPLAINT:  DOE  BRIEF PATIENT DESCRIPTION:  Obese WF in mild resp distress  SIGNIFICANT EVENTS    STUDIES:  12/16 CT chest neg pe 12/16 2 d>>   HISTORY OF PRESENT ILLNESS:   [name], former smoker(quit 18 years ago), life long asthma (controlled on Symbicort, albuterol,prilosec(gerd )with a history of rheumatic fever as child who had  C7-T3 laminectomy for resection of schwannoma on 12/3 per Dr. Ronnald Ramp. She was progressing well till 12/13 at which time she woke up with rt facial pain, chest pain, heartburn and shortness of breath. She was seen by her PCP 12/4 treated with steroid shot. She continued to have worsening dyspnea on exertion with out fever , chills, sweats or productive sputum. She notes increased sob over last 24 hours along with one episode of left calf pain and SOB to the point of calling 911.  Note she has recently gained weight is on anxiolytics and now narcotics. Suspect aspiration by HPI and CHF may be a concern. CT angio negative for PE but shows bilateral upper lobes ground glass, CE negative.Marland Kitchen PCCM asked to evaluate.   PAST MEDICAL HISTORY :   has a past medical history of GERD (gastroesophageal reflux disease); Hypertension; Fatty liver; Personal history of colonic polyps (11/23/2007); Anxiety and depression; Anxiety and depression (10-2011); Complication of anesthesia; PONV (postoperative nausea and vomiting); Shortness of breath dyspnea; H/O pulmonary function tests (?2000); Anxiety; Depression; and Asthma.  has past surgical history that includes Appendectomy (01/1997); Cesarean section (03/1996); Tonsillectomy; Plantar Fascitis; Pilonidal cyst excision; Colonoscopy; Esophagogastroduodenoscopy; Breast biopsy (Right, 07/2007); Laminectomy (N/A, 01/14/2014); and Back surgery. Prior to Admission medications   Medication Sig  Start Date End Date Taking? Authorizing Provider  acetaminophen (TYLENOL) 500 MG tablet Take 500-1,000 mg by mouth every 6 (six) hours as needed for mild pain.    Yes Historical Provider, MD  albuterol (VENTOLIN HFA) 108 (90 BASE) MCG/ACT inhaler INHALE 2 PUFFS BY MOUTH EVERY 6 HOURS AS NEEDED FOR COUGH OR WHEEZING Patient taking differently: Inhale 1-2 puffs into the lungs every 6 (six) hours as needed for wheezing or shortness of breath. INHALE 2 PUFFS BY MOUTH EVERY 6 HOURS AS NEEDED FOR COUGH OR WHEEZING 05/25/13  Yes Colon Branch, MD  ALPRAZolam (XANAX) 0.5 MG tablet TAKE 1 TABLET BY MOUTH THREE TIMES DAILY AS NEEDED Patient taking differently: Take 0.5 mg by mouth 3 (three) times daily as needed for anxiety or sleep. TAKE 1 TABLET BY MOUTH THREE TIMES DAILY AS NEEDED 02/19/13  Yes Colon Branch, MD  cholecalciferol (VITAMIN D) 1000 UNITS tablet Take 2,000 Units by mouth daily.     Yes Historical Provider, MD  doxycycline (VIBRA-TABS) 100 MG tablet Take 1 tablet (100 mg total) by mouth 2 (two) times daily. 01/26/14  Yes Irene Pap, NP  guaiFENesin (MUCINEX) 600 MG 12 hr tablet Take 1 tablet (600 mg total) by mouth 2 (two) times daily. 01/26/14  Yes Irene Pap, NP  hydrochlorothiazide (HYDRODIURIL) 25 MG tablet TAKE 1 TABLET BY MOUTH DAILY   Yes Colon Branch, MD  ibuprofen (ADVIL,MOTRIN) 200 MG tablet Take 200 mg by mouth every 6 (six) hours as needed for moderate pain.    Yes Historical Provider, MD  levonorgestrel (MIRENA) 20 MCG/24HR IUD 1 each by Intrauterine route once.   Yes Historical Provider, MD  Melatonin 5 MG TABS Take 5 mg by mouth daily as needed (sleep).    Yes Historical Provider, MD  montelukast (SINGULAIR) 10 MG tablet TAKE 1 TABLET BY MOUTH EVERY NIGHT AT BEDTIME 10/01/13  Yes Colon Branch, MD  omeprazole (PRILOSEC) 40 MG capsule TAKE ONE CAPSULE BY MOUTH EVERY MORNING   Yes Colon Branch, MD  oxyCODONE-acetaminophen (PERCOCET/ROXICET) 5-325 MG per tablet Take 1-2 tablets by mouth every  4 (four) hours as needed for moderate pain. 01/18/14  Yes Eustace Moore, MD  ranitidine (ZANTAC) 300 MG tablet TAKE 1 TABLET BY MOUTH EVERY NIGHT AT BEDTIME 07/17/13  Yes Colon Branch, MD  sertraline (ZOLOFT) 100 MG tablet Take 150 mg by mouth daily before breakfast.  07/17/13  Yes Historical Provider, MD  SYMBICORT 160-4.5 MCG/ACT inhaler INHALE 2 PUFFS BY MOUTH TWICE DAILY 11/20/13  Yes Colon Branch, MD  tiZANidine (ZANAFLEX) 4 MG capsule Take 4 mg by mouth 3 (three) times daily as needed for muscle spasms.   Yes Historical Provider, MD  tiZANidine (ZANAFLEX) 4 MG tablet Take 4 mg by mouth every 6 (six) hours as needed for muscle spasms.   Yes Historical Provider, MD  methocarbamol (ROBAXIN) 500 MG tablet Take 1 tablet (500 mg total) by mouth every 6 (six) hours as needed for muscle spasms. Patient not taking: Reported on 01/27/2014 01/18/14   Eustace Moore, MD  mupirocin ointment Drue Stager) 2 %  01/11/14   Historical Provider, MD  predniSONE (DELTASONE) 10 MG tablet Starting 03/26/13, Take 4Tpo qam X 2d, then 3T po qam X 3d, then 2T po qd X 3d, then 1T po qam X 3d. Patient not taking: Reported on 01/27/2014 01/26/14   Irene Pap, NP  VENTOLIN HFA 108 (90 BASE) MCG/ACT inhaler INHALE 2 PUFFS EVERY 6 HOURS AS NEEDED FOR COUGH OR WHEEZING Patient not taking: Reported on 01/27/2014 01/11/14   Colon Branch, MD   No Known Allergies  FAMILY HISTORY:  family history includes Diabetes in her other; Hypertension in her mother; Melanoma in her father. There is no history of Coronary artery disease, Stroke, Colon cancer, or Breast cancer. SOCIAL HISTORY:  reports that she quit smoking about 17 years ago. She has never used smokeless tobacco. She reports that she drinks alcohol. She reports that she does not use illicit drugs.  REVIEW OF SYSTEMS:   10 point review of system taken, please see HPI for positives and negatives.   SUBJECTIVE:  Mild distress  VITAL SIGNS: Temp:  [97.6 F (36.4 C)-98.9 F (37.2  C)] 98.1 F (36.7 C) (12/16 0719) Pulse Rate:  [90-116] 116 (12/16 0630) Resp:  [14-24] 19 (12/16 0630) BP: (128-161)/(48-78) 151/63 mmHg (12/16 0630) SpO2:  [91 %-98 %] 91 % (12/16 0630) Weight:  [220 lb (99.791 kg)-222 lb (100.699 kg)] 220 lb (99.791 kg) (12/16 0127)  PHYSICAL EXAMINATION: General:  Obese MAWF in mild to moderated resp distress. Increased wob with any activitiy Neuro:  Appears intact, MAE x 4 , some rt leg weakness HEENT: No JVN/LAN noted Cardiovascular:  hsr rrr 2/6 Murmur Lungs:  + wheezes, crackles bases Abdomen:  Obese + bs Musculoskeletal:  intact Skin:  No lower ext edema, t1 suture line healing. Some mobile fluid noted around wound   Recent Labs Lab 01/27/14 0125  NA 130*  K 3.6*  CL 90*  CO2 24  BUN 10  CREATININE 0.62  GLUCOSE 156*    Recent Labs Lab 01/27/14 0125  HGB 10.6*  HCT  33.5*  WBC 20.8*  PLT 384   Ct Angio Chest Pe W/cm &/or Wo Cm  01/27/2014   CLINICAL DATA:  Shortness of breath  EXAM: CT ANGIOGRAPHY CHEST WITH CONTRAST  TECHNIQUE: Multidetector CT imaging of the chest was performed using the standard protocol during bolus administration of intravenous contrast. Multiplanar CT image reconstructions and MIPs were obtained to evaluate the vascular anatomy.  CONTRAST:  100 cc Omnipaque 350 intravenous  COMPARISON:  None.  FINDINGS: THORACIC INLET/BODY WALL:  No acute abnormality.  MEDIASTINUM:  Normal heart size. No pericardial effusion. Coronary atherosclerosis, multi focal. No acute vascular abnormality. No adenopathy. There are calcified granulomatous changes to the left hilar lymph nodes. Pulmonary angiography is limited by intermittent respiratory motion, but the exam is overall diagnostic. No evidence of pulmonary embolism. No aortic dissection.  LUNG WINDOWS:  There is patchy ground-glass density in the bilateral upper lungs. There is diffuse bronchial wall thickening with scattered mucoid impaction and possible mild scattered  bronchiar impaction. There is a 1 cm pulmonary nodule in the right middle lobe which is stable from 2012 chest radiography and considered benign. Small calcified nodule in the left lower lobe.  UPPER ABDOMEN:  No acute findings.  OSSEOUS:  Recent C7-T3 laminectomy for resection of a schwannoma. There is epidural gas throughout the laminectomy site, possibly retained within hemostatic material. Partially visualized incisional fluid collection without peripheral enhancement, measuring 5 x 7 by at least 7 cm.  Review of the MIP images confirms the above findings.  IMPRESSION: 1. Negative for pulmonary embolism. 2. Patchy ground-glass densities could reflect an atypical pneumonia or inflammatory pneumonitis. There is associated bronchitis. 3. Recent C7 the T3 laminectomy for tumor resection. There is a sizable incisional fluid collection and residual epidural gas.   Electronically Signed   By: Jorje Guild M.D.   On: 01/27/2014 05:13   Principal Problem:   Acute respiratory failure with hypoxia Active Problems:   Asthma   GERD   Anxiety and depression   Asthma exacerbation   Pneumonia, organism unspecified   Essential hypertension   Leucocytosis   Aspiration pneumonia   ASSESSMENT  Acute respiratory distress with bronchospasm  Suspected aspiration  Asthma with exacerbation  Reflux worsened by narcotics and recent spinal surgery Bilateral upper lobe ground glass opacity on CT Chest Pain x 1 with neg trop Hx of rheumatic as a child 12/3 c7- t3 tumor removal per NS  PLAN:  Bronchodilators as ordered Empirical abx Cultures if possible O2 as needed Inhaled steroids BID PPI for gerd trigger Avoid narcotics if possible Check 2 d echo with hx of rheumatic fever and complaint of chest pain Cycle cardiac enzymes Courtesy notification of Dr. Ronnald Ramp of NS  Richardson Landry Minor ACNP Maryanna Shape PCCM Pager [phone] till 3 pm If no answer page [phone] 01/27/2014, 8:40 AM       PCCM  ATTENDING: Pt seen on work rounds with care provider noted above. We reviewed pt's initial presentation, consultants notes and hospital database in detail.  The above assessment and plan was formulated under my direction.  In summary: H/O asthma/COPD Former smoker Abrupt onset of dyspnea CTA chest negative for PE Presently much improved  Appears comfortable @ rest but somewhat anxious appearing Markedly diminished BS without wheezing or other adventitious sounds noted Mild pseudowheeze at end expiration Obese with otherwise normal abdominal exam Extremities warm without edema  I have reviewed labs and CT of chest  I am wholly unimpressed with what are described as infiltrates on  CXR I certainly do not think that the CT of chest supports a diagnosis of bacterial pneumonia requiring vanc/cefepime Her dyspnea is most likely attributable to airways disease with perhaps a component of anxiety and/or VCD  REC: Cont treatment for COPD exacerbation DC vanc and cefepime Levofloxacin X 5 days for AECOPD Recheck CXR in AM 12/17 She might benefit from Pulmonary F/U after discharge   Merton Border, MD;  PCCM service; Mobile [phone]

## 2014-01-27 NOTE — ED Notes (Signed)
Attempted to call report

## 2014-01-27 NOTE — Progress Notes (Signed)
ANTIBIOTIC CONSULT NOTE - INITIAL  Pharmacy Consult for vancomycin  Indication: pneumonia  No Known Allergies  Patient Measurements: Height: 5\' 3"  (160 cm) Weight: 220 lb (99.791 kg) IBW/kg (Calculated) : 52.4 Adjusted Body Weight:   Vital Signs: Temp: 98.2 F (36.8 C) (12/16 0332) Temp Source: Oral (12/16 0332) BP: 148/56 mmHg (12/16 0600) Pulse Rate: 112 (12/16 0600) Intake/Output from previous day:   Intake/Output from this shift:    Labs:  Recent Labs  01/27/14 0125  WBC 20.8*  HGB 10.6*  PLT 384  CREATININE 0.62   Estimated Creatinine Clearance: 87.5 mL/min (by C-G formula based on Cr of 0.62). No results for input(s): VANCOTROUGH, VANCOPEAK, VANCORANDOM, GENTTROUGH, GENTPEAK, GENTRANDOM, TOBRATROUGH, TOBRAPEAK, TOBRARND, AMIKACINPEAK, AMIKACINTROU, AMIKACIN in the last 72 hours.   Microbiology: Recent Results (from the past 720 hour(s))  Surgical pcr screen     Status: Abnormal   Collection Time: 01/11/14  1:05 PM  Result Value Ref Range Status   MRSA, PCR NEGATIVE NEGATIVE Final   Staphylococcus aureus POSITIVE (A) NEGATIVE Final    Comment:        The Xpert SA Assay (FDA approved for NASAL specimens in patients over 43 years of age), is one component of a comprehensive surveillance program.  Test performance has been validated by [employer] for patients greater than or equal to 74 year old. It is not intended to diagnose infection nor to guide or monitor treatment.     Medical History: Past Medical History  Diagnosis Date  . GERD (gastroesophageal reflux disease)     EGD 10/11  . Hypertension   . Fatty liver     per [place] 9/11  . Personal history of colonic polyps 11/23/2007  . Anxiety and depression   . Anxiety and depression 10-2011  . Complication of anesthesia   . PONV (postoperative nausea and vomiting)   . Shortness of breath dyspnea     /w exertion   . H/O pulmonary function tests ?2000    ordered by Dr. Larose Kells, pt. reports thwe  results were wnl.   . Anxiety   . Depression   . Asthma     "breathing is a little tighter today"- 01/11/2014    Medications:   (Not in a hospital admission) Assessment: vancomcyin for PNA-worsening sob x3 days. Recent surgery so consider HCAP   Goal of Therapy:  Vancomycin trough level 15-20 mcg/ml  Plan:  Vancomycin 1250 mg q12h  Trough before 4th dose.   Curlene Dolphin 01/27/2014,6:10 AM

## 2014-01-27 NOTE — ED Notes (Signed)
EKG given to Dr. Jeneen Rinks, clarified with dose of solumedrol ordered, patient received dose with EMS.  MD acknowledges, gives verbal order to cancel.

## 2014-01-27 NOTE — ED Notes (Signed)
Called lab and spoke with Ulyess Mort again in regards to patient's missing labs.

## 2014-01-27 NOTE — ED Notes (Signed)
Called CT to report patient's chemistry results are available and patient is ready for transport.

## 2014-01-27 NOTE — H&P (Addendum)
[facility] History and Physical  Vanessa Middleton LNL:[id number] DOB: 19-Dec-1955 DOA: 01/27/2014  Referring physician: ER physician. PCP: Kathlene November, MD   Chief Complaint: Shortness of breath.  HPI: Vanessa Middleton is a 59 y.o. 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 Patient had gone to patient's neurosurgeon yesterday for follow-up and over the patient was found to be wheezing and was referred to primary care physician. Her primary care physician had given injection of Solu-Medrol and and breathing treatment. Patient presented to the ER because of worsening shortness of breath and CT angiogram of the chest was negative for PE but did show bilateral infiltrates concerning for pneumonitis versus atypical infection. Patient in the ER was found to be wheezing and diaphoretic. Labs show leukocytosis. Patient has been afebrile otherwise. Patient is requiring 4 L of oxygen and patient usually on no oxygen at home. Patient has been restarted on antibiotics and placed on Solu-Medrol and nebulizer and admitted for acute respiratory failure probably from asthma and possible pneumonia. Patient denies any chest pain nausea vomiting abdominal pain diarrhea fever chills though patient does have diaphoresis.   Review of Systems: As presented in the history of presenting illness, rest negative.  Past Medical History  Diagnosis Date  . GERD (gastroesophageal reflux disease)     EGD 10/11  . Hypertension   . Fatty liver     per [place] 9/11  . Personal history of colonic polyps 11/23/2007  . Anxiety and depression   . Anxiety and depression 10-2011  . Complication of anesthesia   . PONV (postoperative nausea and vomiting)   . Shortness of breath dyspnea     /w exertion   . H/O pulmonary function tests ?2000    ordered by Dr. Larose Kells, pt. reports thwe results were wnl.   . Anxiety    . Depression   . Asthma     "breathing is a little tighter today"- 01/11/2014   Past Surgical History  Procedure Laterality Date  . Appendectomy  01/1997  . Cesarean section  03/1996  . Tonsillectomy    . Plantar fascitis      right foot  . Pilonidal cyst excision    . Colonoscopy    . Esophagogastroduodenoscopy    . Breast biopsy Right 07/2007    neg  . Laminectomy N/A 01/14/2014    Procedure: CERVICAL LAMINECTOMY FOR TUMOR. 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 Back surgery     Social History:  reports that she quit smoking about 17 years ago. She has never used smokeless tobacco. She reports that she drinks alcohol. She reports that she does not use illicit drugs. Where does patient livhome. Can patient participate in [facility].  No Known Allergies  Family History:  Family History  Problem Relation Age of Onset  . Melanoma Father   . Coronary artery disease Neg Hx   . Stroke Neg Hx   . Colon cancer Neg Hx   . Breast cancer Neg Hx   . Diabetes Other     cousin  . Hypertension Mother       Prior to Admission medications   Medication Sig Start Date End Date Taking? Authorizing Provider  acetaminophen (TYLENOL) 500 MG tablet Take 500-1,000 mg by mouth every 6 (six) hours as  needed for mild pain.    Yes Historical Provider, MD  albuterol (VENTOLIN HFA) 108 (90 BASE) MCG/ACT inhaler INHALE 2 PUFFS BY MOUTH EVERY 6 HOURS AS NEEDED FOR COUGH OR WHEEZING Patient taking differently: Inhale 1-2 puffs into the lungs every 6 (six) hours as needed for wheezing or shortness of breath. INHALE 2 PUFFS BY MOUTH EVERY 6 HOURS AS NEEDED FOR COUGH OR WHEEZING 05/25/13  Yes Colon Branch, MD  ALPRAZolam (XANAX) 0.5 MG tablet TAKE 1 TABLET BY MOUTH THREE TIMES DAILY AS NEEDED Patient taking differently: Take 0.5 mg by mouth 3 (three) times daily as needed for anxiety or sleep. TAKE 1  TABLET BY MOUTH THREE TIMES DAILY AS NEEDED 02/19/13  Yes Colon Branch, MD  cholecalciferol (VITAMIN D) 1000 UNITS tablet Take 2,000 Units by mouth daily.     Yes Historical Provider, MD  doxycycline (VIBRA-TABS) 100 MG tablet Take 1 tablet (100 mg total) by mouth 2 (two) times daily. 01/26/14  Yes Irene Pap, NP  guaiFENesin (MUCINEX) 600 MG 12 hr tablet Take 1 tablet (600 mg total) by mouth 2 (two) times daily. 01/26/14  Yes Irene Pap, NP  hydrochlorothiazide (HYDRODIURIL) 25 MG tablet TAKE 1 TABLET BY MOUTH DAILY   Yes Colon Branch, MD  ibuprofen (ADVIL,MOTRIN) 200 MG tablet Take 200 mg by mouth every 6 (six) hours as needed for moderate pain.    Yes Historical Provider, MD  levonorgestrel (MIRENA) 20 MCG/24HR IUD 1 each by Intrauterine route once.   Yes Historical Provider, MD  Melatonin 5 MG TABS Take 5 mg by mouth daily as needed (sleep).    Yes Historical Provider, MD  montelukast (SINGULAIR) 10 MG tablet TAKE 1 TABLET BY MOUTH EVERY NIGHT AT BEDTIME 10/01/13  Yes Colon Branch, MD  omeprazole (PRILOSEC) 40 MG capsule TAKE ONE CAPSULE BY MOUTH EVERY MORNING   Yes Colon Branch, MD  oxyCODONE-acetaminophen (PERCOCET/ROXICET) 5-325 MG per tablet Take 1-2 tablets by mouth every 4 (four) hours as needed for moderate pain. 01/18/14  Yes Eustace Moore, MD  ranitidine (ZANTAC) 300 MG tablet TAKE 1 TABLET BY MOUTH EVERY NIGHT AT BEDTIME 07/17/13  Yes Colon Branch, MD  sertraline (ZOLOFT) 100 MG tablet Take 150 mg by mouth daily before breakfast.  07/17/13  Yes Historical Provider, MD  SYMBICORT 160-4.5 MCG/ACT inhaler INHALE 2 PUFFS BY MOUTH TWICE DAILY 11/20/13  Yes Colon Branch, MD  tiZANidine (ZANAFLEX) 4 MG capsule Take 4 mg by mouth 3 (three) times daily as needed for muscle spasms.   Yes Historical Provider, MD  tiZANidine (ZANAFLEX) 4 MG tablet Take 4 mg by mouth every 6 (six) hours as needed for muscle spasms.   Yes Historical Provider, MD  methocarbamol (ROBAXIN) 500 MG tablet Take 1 tablet (500 mg total)  by mouth every 6 (six) hours as needed for muscle spasms. Patient not taking: Reported on 01/27/2014 01/18/14   Eustace Moore, MD  mupirocin ointment Drue Stager) 2 %  01/11/14   Historical Provider, MD  predniSONE (DELTASONE) 10 MG tablet Starting 03/26/13, Take 4Tpo qam X 2d, then 3T po qam X 3d, then 2T po qd X 3d, then 1T po qam X 3d. Patient not taking: Reported on 01/27/2014 01/26/14   Irene Pap, NP  VENTOLIN HFA 108 (90 BASE) MCG/ACT inhaler INHALE 2 PUFFS EVERY 6 HOURS AS NEEDED FOR COUGH OR WHEEZING Patient not taking: Reported on 01/27/2014 01/11/14   Colon Branch, MD  Physical Exam: Filed Vitals:   01/27/14 0533 01/27/14 0545 01/27/14 0600 01/27/14 0630  BP: 153/56 149/59 148/56 151/63  Pulse: 110 111 112 116  Temp:      TempSrc:      Resp: 19 15 18 19   Height:      Weight:      SpO2: 96% 96% 92% 91%     General:  Well-developed and nourished.  Eyes: Anicteric no pallor.  ENT: No discharge from the ears eyes nose and mouth.  Neck: No mass felt JVD not appreciated.  Cardiovascular: S1-S2 heard tachycardic.  Respiratory: Mild expiratory wheeze no crepitations.  Abdomen: Soft nontender bowel sounds present.  Skin: No active discharge from recent incision area.  Musculoskeletal: No edema.  Psychiatric: Appears normal.  Neurologic: Alert awake oriented to time place and person. Moves all extremities.  Labs on Admission:  Basic Metabolic Panel:  Recent Labs Lab 01/27/14 0125  NA 130*  K 3.6*  CL 90*  CO2 24  GLUCOSE 156*  BUN 10  CREATININE 0.62  CALCIUM 9.1   Liver Function Tests: No results for input(s): AST, ALT, ALKPHOS, BILITOT, PROT, ALBUMIN in the last 168 hours. No results for input(s): LIPASE, AMYLASE in the last 168 hours. No results for input(s): AMMONIA in the last 168 hours. CBC:  Recent Labs Lab 01/27/14 0125  WBC 20.8*  NEUTROABS 15.1*  HGB 10.6*  HCT 33.5*  MCV 83.3  PLT 384   Cardiac Enzymes:  Recent Labs Lab  01/27/14 0125  TROPONINI <0.30    BNP (last 3 results)  Recent Labs  01/27/14 0125  PROBNP 141.5*   CBG: No results for input(s): GLUCAP in the last 168 hours.  Radiological Exams on Admission: Ct Angio Chest Pe W/cm &/or Wo Cm  01/27/2014   CLINICAL DATA:  Shortness of breath  EXAM: CT ANGIOGRAPHY CHEST WITH CONTRAST  TECHNIQUE: Multidetector CT imaging of the chest was performed using the standard protocol during bolus administration of intravenous contrast. Multiplanar CT image reconstructions and MIPs were obtained to evaluate the vascular anatomy.  CONTRAST:  100 cc Omnipaque 350 intravenous  COMPARISON:  None.  FINDINGS: THORACIC INLET/BODY WALL:  No acute abnormality.  MEDIASTINUM:  Normal heart size. No pericardial effusion. Coronary atherosclerosis, multi focal. No acute vascular abnormality. No adenopathy. There are calcified granulomatous changes to the left hilar lymph nodes. Pulmonary angiography is limited by intermittent respiratory motion, but the exam is overall diagnostic. No evidence of pulmonary embolism. No aortic dissection.  LUNG WINDOWS:  There is patchy ground-glass density in the bilateral upper lungs. There is diffuse bronchial wall thickening with scattered mucoid impaction and possible mild scattered bronchiar impaction. There is a 1 cm pulmonary nodule in the right middle lobe which is stable from 2012 chest radiography and considered benign. Small calcified nodule in the left lower lobe.  UPPER ABDOMEN:  No acute findings.  OSSEOUS:  Recent C7-T3 laminectomy for resection of a schwannoma. There is epidural gas throughout the laminectomy site, possibly retained within hemostatic material. Partially visualized incisional fluid collection without peripheral enhancement, measuring 5 x 7 by at least 7 cm.  Review of the MIP images confirms the above findings.  IMPRESSION: 1. Negative for pulmonary embolism. 2. Patchy ground-glass densities could reflect an atypical  pneumonia or inflammatory pneumonitis. There is associated bronchitis. 3. Recent C7 the T3 laminectomy for tumor resection. There is a sizable incisional fluid collection and residual epidural gas.   Electronically Signed   By: Gilford Silvius.D.  On: 01/27/2014 05:13    EKG: Independently reviewed. Sinus tachycardia.  Assessment/Plan Principal Problem:   Acute respiratory failure with hypoxia Active Problems:   Asthma exacerbation   Pneumonia, organism unspecified   Essential hypertension   Leucocytosis   1. Acute hypoxic respiratory failure - probably from asthma with possible pneumonitis. At this time patient has been continued on Solu-Medrol and empirically started on antibiotics for healthcare associated pneumonia. Continue with nebulizer Pulmicort. Check blood cultures and influenza PCR. I have ordered ABG. I have also consulted pulmonary critical care. Check lactic acid levels. 2. Leukocytosis/SIRS - follow blood cultures. Patient at this time has been started on empiric antibiotics. Patient is afebrile otherwise. Closely follow CBC trends. Check lactic acid levels. 3. Bilateral chest infiltrates - see #1. 4. Hypertension - continue present medication. 5. Recent thoracic surgery for schwannoma - was seen by patient's neurosurgeon yesterday. 6. History of. PRE diabetes - closely follow CBGs and sliding scale as patient is on Solu-Medrol. 7. Mild anemia probably postoperative blood loss - follow CBC.    Code Status: Full code.  Family Communication: Patient's sister at the bedside.  Disposition Plan: Admit to inpatient.    KAKRAKANDY,ARSHAD N. [facility] Pager [phone].  If 7PM-7AM, please contact night-coverage [email or web address] Password [email or web address] 01/27/2014, 7:18 AM

## 2014-01-27 NOTE — ED Provider Notes (Signed)
CSN: [id number]     Arrival date & time 01/27/14  0102 History  This chart was scribed for Vanessa Furry, MD by Delphia Grates, ED Scribe. This patient was seen in room D30C/D30C and the patient's care was started at 1:10 AM.   Chief Complaint  Patient presents with  . Shortness of Breath    The history is provided by the patient and medical records. No language interpreter was used.     HPI Comments: Vanessa Middleton is a 58 y.o. female brought in by ambulance, who presents to the Emergency Department complaining of SOB onset 3 days ago. Patient was recently discharged from the hospital approximately 1 week ago after neck surgery (cervical laminectomy; January 14, 2014). Patient reports she was evaluated by PCP, 12 hours ago, with complaint of similar symptoms and wheezing. She reports a CXR was ordered, and she received 2 breathing treatments and a steroid injection. Patient reports the provider was concerned for possible clots and if a CT was needed. She notes associated pain in left calf and loss of bowel/bladder control prior to onset of SOB. She reports history of asthma, bronchitis, and pneumonia, but this current episode of SOB is worse. She denies history of heart conditions or CHF. Patient is currently on Symbicort and uses albuterol when needed. Patient was informed by PCP that her incisions "looked great" today.   Past Medical History  Diagnosis Date  . GERD (gastroesophageal reflux disease)     EGD 10/11  . Hypertension   . Fatty liver     per [place] 9/11  . Personal history of colonic polyps 11/23/2007  . Anxiety and depression   . Anxiety and depression 10-2011  . Complication of anesthesia   . PONV (postoperative nausea and vomiting)   . Shortness of breath dyspnea     /w exertion   . H/O pulmonary function tests ?2000    ordered by Dr. Larose Kells, pt. reports thwe results were wnl.   . Anxiety   . Depression   . Asthma     "breathing is a little tighter today"- 01/11/2014    Past Surgical History  Procedure Laterality Date  . Appendectomy  01/1997  . Cesarean section  03/1996  . Tonsillectomy    . Plantar fascitis      right foot  . Pilonidal cyst excision    . Colonoscopy    . Esophagogastroduodenoscopy    . Breast biopsy Right 07/2007    neg  . Laminectomy N/A 01/14/2014    Procedure: CERVICAL LAMINECTOMY FOR TUMOR. 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 Back surgery     Family History  Problem Relation Age of Onset  . Melanoma Father   . Coronary artery disease Neg Hx   . Stroke Neg Hx   . Colon cancer Neg Hx   . Breast cancer Neg Hx   . Diabetes Other     cousin  . Hypertension Mother    History  Substance Use Topics  . Smoking status: Former Smoker    Quit date: 02/13/1996  . Smokeless tobacco: Never Used     Comment: used to smoke 2 ppd  . Alcohol Use: 0.0 oz/week    0 Not specified per week     Comment: 2-3 x/week   OB History    No data available     Review of Systems  Constitutional:  Negative for fever, chills, diaphoresis, appetite change and fatigue.  HENT: Negative for mouth sores, sore throat and trouble swallowing.   Eyes: Negative for visual disturbance.  Respiratory: Positive for shortness of breath and wheezing. Negative for cough and chest tightness.   Cardiovascular: Negative for chest pain.  Gastrointestinal: Negative for nausea, vomiting, abdominal pain, diarrhea and abdominal distention.  Endocrine: Negative for polydipsia, polyphagia and polyuria.  Genitourinary: Negative for dysuria, frequency and hematuria.  Musculoskeletal: Positive for myalgias (left calf). Negative for gait problem.  Skin: Negative for color change, pallor and rash.  Neurological: Negative for dizziness, syncope, light-headedness and headaches.  Hematological: Does not bruise/bleed easily.  Psychiatric/Behavioral: Negative  for behavioral problems and confusion.      Allergies  Review of patient's allergies indicates no known allergies.  Home Medications   Prior to Admission medications   Medication Sig Start Date End Date Taking? Authorizing Provider  acetaminophen (TYLENOL) 500 MG tablet Take 500-1,000 mg by mouth every 6 (six) hours as needed for mild pain.    Yes Historical Provider, MD  albuterol (VENTOLIN HFA) 108 (90 BASE) MCG/ACT inhaler INHALE 2 PUFFS BY MOUTH EVERY 6 HOURS AS NEEDED FOR COUGH OR WHEEZING Patient taking differently: Inhale 1-2 puffs into the lungs every 6 (six) hours as needed for wheezing or shortness of breath. INHALE 2 PUFFS BY MOUTH EVERY 6 HOURS AS NEEDED FOR COUGH OR WHEEZING 05/25/13  Yes Colon Branch, MD  ALPRAZolam (XANAX) 0.5 MG tablet TAKE 1 TABLET BY MOUTH THREE TIMES DAILY AS NEEDED Patient taking differently: Take 0.5 mg by mouth 3 (three) times daily as needed for anxiety or sleep. TAKE 1 TABLET BY MOUTH THREE TIMES DAILY AS NEEDED 02/19/13  Yes Colon Branch, MD  cholecalciferol (VITAMIN D) 1000 UNITS tablet Take 2,000 Units by mouth daily.     Yes Historical Provider, MD  doxycycline (VIBRA-TABS) 100 MG tablet Take 1 tablet (100 mg total) by mouth 2 (two) times daily. 01/26/14  Yes Irene Pap, NP  guaiFENesin (MUCINEX) 600 MG 12 hr tablet Take 1 tablet (600 mg total) by mouth 2 (two) times daily. 01/26/14  Yes Irene Pap, NP  hydrochlorothiazide (HYDRODIURIL) 25 MG tablet TAKE 1 TABLET BY MOUTH DAILY   Yes Colon Branch, MD  ibuprofen (ADVIL,MOTRIN) 200 MG tablet Take 200 mg by mouth every 6 (six) hours as needed for moderate pain.    Yes Historical Provider, MD  levonorgestrel (MIRENA) 20 MCG/24HR IUD 1 each by Intrauterine route once.   Yes Historical Provider, MD  Melatonin 5 MG TABS Take 5 mg by mouth daily as needed (sleep).    Yes Historical Provider, MD  montelukast (SINGULAIR) 10 MG tablet TAKE 1 TABLET BY MOUTH EVERY NIGHT AT BEDTIME 10/01/13  Yes Colon Branch, MD   omeprazole (PRILOSEC) 40 MG capsule TAKE ONE CAPSULE BY MOUTH EVERY MORNING   Yes Colon Branch, MD  oxyCODONE-acetaminophen (PERCOCET/ROXICET) 5-325 MG per tablet Take 1-2 tablets by mouth every 4 (four) hours as needed for moderate pain. 01/18/14  Yes Eustace Moore, MD  ranitidine (ZANTAC) 300 MG tablet TAKE 1 TABLET BY MOUTH EVERY NIGHT AT BEDTIME 07/17/13  Yes Colon Branch, MD  sertraline (ZOLOFT) 100 MG tablet Take 150 mg by mouth daily before breakfast.  07/17/13  Yes Historical Provider, MD  SYMBICORT 160-4.5 MCG/ACT inhaler INHALE 2 PUFFS BY MOUTH TWICE DAILY 11/20/13  Yes Colon Branch, MD  tiZANidine (ZANAFLEX) 4 MG capsule Take 4 mg by  mouth 3 (three) times daily as needed for muscle spasms.   Yes Historical Provider, MD  tiZANidine (ZANAFLEX) 4 MG tablet Take 4 mg by mouth every 6 (six) hours as needed for muscle spasms.   Yes Historical Provider, MD  methocarbamol (ROBAXIN) 500 MG tablet Take 1 tablet (500 mg total) by mouth every 6 (six) hours as needed for muscle spasms. Patient not taking: Reported on 01/27/2014 01/18/14   Eustace Moore, MD  mupirocin ointment Drue Stager) 2 %  01/11/14   Historical Provider, MD  predniSONE (DELTASONE) 10 MG tablet Starting 03/26/13, Take 4Tpo qam X 2d, then 3T po qam X 3d, then 2T po qd X 3d, then 1T po qam X 3d. Patient not taking: Reported on 01/27/2014 01/26/14   Irene Pap, NP  VENTOLIN HFA 108 (90 BASE) MCG/ACT inhaler INHALE 2 PUFFS EVERY 6 HOURS AS NEEDED FOR COUGH OR WHEEZING Patient not taking: Reported on 01/27/2014 01/11/14   Colon Branch, MD   Triage Vitals: BP 158/59 mmHg  Pulse 110  Temp(Src) 98.9 F (37.2 C) (Oral)  Resp 24  Ht 5\' 3"  (1.6 m)  Wt 220 lb (99.791 kg)  BMI 38.98 kg/m2  SpO2 98%  LMP 05/01/2010  Physical Exam  Constitutional: She is oriented to person, place, and time. She appears well-developed and well-nourished. No distress.  HENT:  Head: Normocephalic.  Eyes: Conjunctivae are normal. Pupils are equal, round, and  reactive to light. No scleral icterus.  Neck: Normal range of motion. Neck supple. No thyromegaly present.    Well-healed midline incision.  Cardiovascular: Normal rate and regular rhythm.  Exam reveals no gallop and no friction rub.   No murmur heard. Pulmonary/Chest: Effort normal and breath sounds normal. No respiratory distress. She has no wheezes. She has no rales.  Wheezing prolongation in all fields. Globally diminished breath sounds.  Abdominal: Soft. Bowel sounds are normal. She exhibits no distension. There is no tenderness. There is no rebound.  Musculoskeletal: Normal range of motion.  Neurological: She is alert and oriented to person, place, and time.  Normal strength sensation to lower extremities.  Skin: Skin is warm and dry. No rash noted.  Psychiatric: She has a normal mood and affect. Her behavior is normal.  Nursing note and vitals reviewed.   ED Course  Procedures (including critical care time)  DIAGNOSTIC STUDIES: Oxygen Saturation is 98% on [place], normal by my interpretation.    COORDINATION OF CARE: At 0117 Discussed treatment plan with patient which includes labs. Patient agrees.   Labs Review Labs Reviewed  CBC WITH DIFFERENTIAL - Abnormal; Notable for the following:    WBC 20.8 (*)    Hemoglobin 10.6 (*)    HCT 33.5 (*)    Neutro Abs 15.1 (*)    Monocytes Absolute 2.1 (*)    All other components within normal limits  BASIC METABOLIC PANEL - Abnormal; Notable for the following:    Sodium 130 (*)    Potassium 3.6 (*)    Chloride 90 (*)    Glucose, Bld 156 (*)    Anion gap 16 (*)    All other components within normal limits  PRO B NATRIURETIC PEPTIDE - Abnormal; Notable for the following:    Pro B Natriuretic peptide (BNP) 141.5 (*)    All other components within normal limits  TROPONIN I    Imaging Review Ct Angio Chest Pe W/cm &/or Wo Cm  01/27/2014   CLINICAL DATA:  Shortness of breath  EXAM: CT ANGIOGRAPHY  CHEST WITH CONTRAST  TECHNIQUE:  Multidetector CT imaging of the chest was performed using the standard protocol during bolus administration of intravenous contrast. Multiplanar CT image reconstructions and MIPs were obtained to evaluate the vascular anatomy.  CONTRAST:  100 cc Omnipaque 350 intravenous  COMPARISON:  None.  FINDINGS: THORACIC INLET/BODY WALL:  No acute abnormality.  MEDIASTINUM:  Normal heart size. No pericardial effusion. Coronary atherosclerosis, multi focal. No acute vascular abnormality. No adenopathy. There are calcified granulomatous changes to the left hilar lymph nodes. Pulmonary angiography is limited by intermittent respiratory motion, but the exam is overall diagnostic. No evidence of pulmonary embolism. No aortic dissection.  LUNG WINDOWS:  There is patchy ground-glass density in the bilateral upper lungs. There is diffuse bronchial wall thickening with scattered mucoid impaction and possible mild scattered bronchiar impaction. There is a 1 cm pulmonary nodule in the right middle lobe which is stable from 2012 chest radiography and considered benign. Small calcified nodule in the left lower lobe.  UPPER ABDOMEN:  No acute findings.  OSSEOUS:  Recent C7-T3 laminectomy for resection of a schwannoma. There is epidural gas throughout the laminectomy site, possibly retained within hemostatic material. Partially visualized incisional fluid collection without peripheral enhancement, measuring 5 x 7 by at least 7 cm.  Review of the MIP images confirms the above findings.  IMPRESSION: 1. Negative for pulmonary embolism. 2. Patchy ground-glass densities could reflect an atypical pneumonia or inflammatory pneumonitis. There is associated bronchitis. 3. Recent C7 the T3 laminectomy for tumor resection. There is a sizable incisional fluid collection and residual epidural gas.   Electronically Signed   By: Jorje Guild M.D.   On: 01/27/2014 05:13     EKG Interpretation   Date/Time:  Wednesday January 27 2014 01:30:00  EST Ventricular Rate:  108 PR Interval:  135 QRS Duration: 66 QT Interval:  354 QTC Calculation: 474 R Axis:   45 Text Interpretation:  Sinus tachycardia Low voltage, precordial leads  Confirmed by Jeneen Rinks  MD, Suarez ([id number]) on 01/27/2014 1:35:14 AM      MDM   Final diagnoses:  Dyspnea  SOB (shortness of breath)    0315:  Patient diaphoretic. Afebrile. Continues with bronchospasm, prolongation, diminished breath sounds. Renal function intact. Awaiting angiogram. Continues to have no complaints of pain. Still feels dyspneic.  05:32:  CTA negative for pulmonary embolism or aortic dissection. Patchy groundglass density in the bilateral upper lungs with diffuse bronchial wall thickening.    CT also shows Persistence of the dural gas within the laminectomy site. No enhancement of incisional fluid.     Vanessa Furry, MD 01/30/14 [phone]

## 2014-01-27 NOTE — ED Notes (Signed)
Called Dr. Jeneen Rinks to report Dunia Pringle count of 20.8

## 2014-01-28 ENCOUNTER — Inpatient Hospital Stay (HOSPITAL_COMMUNITY): Payer: BC Managed Care – PPO

## 2014-01-28 DIAGNOSIS — J690 Pneumonitis due to inhalation of food and vomit: Secondary | ICD-10-CM

## 2014-01-28 LAB — CBC
HCT: 32.7 % — ABNORMAL LOW (ref 36.0–46.0)
Hemoglobin: 10.1 g/dL — ABNORMAL LOW (ref 12.0–15.0)
MCH: 26 pg (ref 26.0–34.0)
MCHC: 30.9 g/dL (ref 30.0–36.0)
MCV: 84.3 fL (ref 78.0–100.0)
Platelets: 434 10*3/uL — ABNORMAL HIGH (ref 150–400)
RBC: 3.88 MIL/uL (ref 3.87–5.11)
RDW: 14.3 % (ref 11.5–15.5)
WBC: 22.3 10*3/uL — AB (ref 4.0–10.5)

## 2014-01-28 LAB — BASIC METABOLIC PANEL
Anion gap: 15 (ref 5–15)
BUN: 14 mg/dL (ref 6–23)
CO2: 28 mEq/L (ref 19–32)
Calcium: 9.9 mg/dL (ref 8.4–10.5)
Chloride: 97 mEq/L (ref 96–112)
Creatinine, Ser: 0.68 mg/dL (ref 0.50–1.10)
GFR calc non Af Amer: >90 mL/min (ref >90)
GLUCOSE: 146 mg/dL — AB (ref 70–99)
POTASSIUM: 4.5 meq/L (ref 3.7–5.3)
SODIUM: 140 meq/L (ref 137–147)

## 2014-01-28 LAB — GLUCOSE, CAPILLARY
GLUCOSE-CAPILLARY: 191 mg/dL — AB (ref 70–99)
GLUCOSE-CAPILLARY: 94 mg/dL (ref 70–99)
Glucose-Capillary: 163 mg/dL — ABNORMAL HIGH (ref 70–99)
Glucose-Capillary: 114 mg/dL — ABNORMAL HIGH (ref 70–99)

## 2014-01-28 MED ORDER — CALCIUM CARBONATE ANTACID 500 MG PO CHEW
1.0000 | CHEWABLE_TABLET | Freq: Three times a day (TID) | ORAL | Status: DC | PRN
  Administered 2014-01-28: 200 mg via ORAL
  Filled 2014-01-28: qty 1

## 2014-01-28 NOTE — Progress Notes (Signed)
  Echocardiogram 2D Echocardiogram has been performed.  Vanessa Middleton 01/28/2014, 3:13 PM

## 2014-01-28 NOTE — Progress Notes (Signed)
Name: Vanessa Middleton MRN: [id number] DOB: December 10, 1955    ADMISSION DATE:  01/27/2014 CONSULTATION DATE: 12/16  REFERRING MD :  [facility]  PCP - paz  CHIEF COMPLAINT:  DOE  BRIEF PATIENT DESCRIPTION:  Obese WF in mild resp distress  SIGNIFICANT EVENTS    STUDIES:  12/16 CT chest neg pe 12/16 2 d echo>>   HISTORY OF PRESENT ILLNESS:   [name], former smoker(quit 18 years ago), life long asthma (controlled on Symbicort, albuterol,prilosec(gerd )with a history of rheumatic fever as child who had  C7-T3 laminectomy for resection of schwannoma on 12/3 per Dr. Ronnald Ramp. She was progressing well till 12/13 at which time she woke up with rt facial pain, chest pain, heartburn and shortness of breath. She was seen by her PCP 12/4 treated with steroid shot. She continued to have worsening dyspnea on exertion with out fever , chills, sweats or productive sputum. She notes increased sob over last 24 hours along with one episode of left calf pain and SOB to the point of calling 911.  Note she has recently gained weight is on anxiolytics and now narcotics. Suspect aspiration by HPI and CHF may be a concern. CT angio negative for PE but shows bilateral upper lobes ground glass, CE negative.Marland Kitchen PCCM asked to evaluate.   SUBJECTIVE: afebrile C/o dyspnea on moving around Mild distress  VITAL SIGNS: Temp:  [97.6 F (36.4 C)-98.2 F (36.8 C)] 97.6 F (36.4 C) (12/17 0848) Pulse Rate:  [72-100] 72 (12/17 0848) Resp:  [17-18] 18 (12/17 0848) BP: (117-162)/(47-101) 131/55 mmHg (12/17 0848) SpO2:  [94 %-100 %] 99 % (12/17 0852) Weight:  [103.828 kg (228 lb 14.4 oz)] 103.828 kg (228 lb 14.4 oz) (12/17 0514)  PHYSICAL EXAMINATION: General:  Obese MAWF in mild  resp distress, able to lie supine  Neuro:  Appears intact, MAE x 4 , some rt leg weakness HEENT: No JVN/LAN noted Cardiovascular:  hsr rrr 2/6 Murmur Lungs:  + wheezes, crackles bases, Mild pseudowheeze at end expiration Abdomen:  Obese +  bs Musculoskeletal:  intact Skin:  No lower ext edema, t1 suture line healing. Some mobile fluid noted around wound   Recent Labs Lab 01/27/14 0125 01/27/14 0810 01/28/14 0425  NA 130* 135* 140  K 3.6* 3.2* 4.5  CL 90* 93* 97  CO2 24 24 28   BUN 10 10 14   CREATININE 0.62 0.58 0.68  GLUCOSE 156* 292* 146*    Recent Labs Lab 01/27/14 0125 01/27/14 0810 01/28/14 0425  HGB 10.6* 10.9* 10.1*  HCT 33.5* 34.3* 32.7*  WBC 20.8* 17.1* 22.3*  PLT 384 389 434*   Dg Chest 2 View  01/28/2014   CLINICAL DATA:  Cough.  EXAM: CHEST  2 VIEW  COMPARISON:  CT chest 01/29/2014. Chest x-ray 01/26/2014, 08/11/2010.  FINDINGS: Mediastinum and hilar structures normal. Heart size stable. Pulmonary vascularity normal. Stable very mild bilateral interstitial prominence, particularly in the upper lobes again noted. Findings again consistent with mild pneumonitis. Stable pulmonary nodule right mid lung, and unchanged from prior chest x-ray 08/11/2010. This consistent with benign pulmonary nodule. No pleural effusion or pneumothorax.  IMPRESSION: 1. Stable very mild interstitial infiltrates, particularly in the upper lobes, unchanged from prior CT of 01/27/2014 and consistent with very mild changes of pneumonitis. 2. Stable pulmonary nodule right mid lung, unchanged from prior chest x-ray 08/11/2010. This is consistent with a benign pulmonary nodule.   Electronically Signed   By: Marcello Moores  Register   On: 01/28/2014 07:57  Ct Angio Chest Pe W/cm &/or Wo Cm  01/27/2014   CLINICAL DATA:  Shortness of breath  EXAM: CT ANGIOGRAPHY CHEST WITH CONTRAST  TECHNIQUE: Multidetector CT imaging of the chest was performed using the standard protocol during bolus administration of intravenous contrast. Multiplanar CT image reconstructions and MIPs were obtained to evaluate the vascular anatomy.  CONTRAST:  100 cc Omnipaque 350 intravenous  COMPARISON:  None.  FINDINGS: THORACIC INLET/BODY WALL:  No acute abnormality.   MEDIASTINUM:  Normal heart size. No pericardial effusion. Coronary atherosclerosis, multi focal. No acute vascular abnormality. No adenopathy. There are calcified granulomatous changes to the left hilar lymph nodes. Pulmonary angiography is limited by intermittent respiratory motion, but the exam is overall diagnostic. No evidence of pulmonary embolism. No aortic dissection.  LUNG WINDOWS:  There is patchy ground-glass density in the bilateral upper lungs. There is diffuse bronchial wall thickening with scattered mucoid impaction and possible mild scattered bronchiar impaction. There is a 1 cm pulmonary nodule in the right middle lobe which is stable from 2012 chest radiography and considered benign. Small calcified nodule in the left lower lobe.  UPPER ABDOMEN:  No acute findings.  OSSEOUS:  Recent C7-T3 laminectomy for resection of a schwannoma. There is epidural gas throughout the laminectomy site, possibly retained within hemostatic material. Partially visualized incisional fluid collection without peripheral enhancement, measuring 5 x 7 by at least 7 cm.  Review of the MIP images confirms the above findings.  IMPRESSION: 1. Negative for pulmonary embolism. 2. Patchy ground-glass densities could reflect an atypical pneumonia or inflammatory pneumonitis. There is associated bronchitis. 3. Recent C7 the T3 laminectomy for tumor resection. There is a sizable incisional fluid collection and residual epidural gas.   Electronically Signed   By: Jorje Guild M.D.   On: 01/27/2014 05:13   Principal Problem:   Acute respiratory failure with hypoxia Active Problems:   Asthma   GERD   Anxiety and depression   Asthma exacerbation   Pneumonia, organism unspecified   Essential hypertension   Leucocytosis   Aspiration pneumonia   ASSESSMENT  Acute respiratory distress with bronchospasm -resolving  Bilateral upper lobe ground glass opacity on CT- Suspected aspiration  Asthma with exacerbation ? Underlying  COPD (61 PPD ex- smoker)  Reflux worsened by narcotics and recent spinal surgery  Chest Pain x 1 with neg trop Hx of rheumatic as a child 12/3 c7- t3 tumor removal per NS  PLAN:  Bronchodilators as ordered Levaquin x 7 ds ok Cultures if possible O2 as needed Inhaled steroids BID PPI for gerd trigger Avoid narcotics if possible Await 2 d echo with hx of rheumatic fever and complaint of chest pain She needs Pulmonary F/U after discharge  ALVA,RAKESH V. MD   01/28/2014, 10:10 AM

## 2014-01-28 NOTE — Progress Notes (Signed)
[facility]  PROGRESS NOTE  SYVANNA CIOLINO XNA:[id number] DOB: 07-17-1955 DOA: 01/27/2014 PCP: Kathlene November, MD  Assessment/Plan: Acute hypoxic respiratory failure -  -Solu-Medrol - change to PO tomm? -wean O2 as tolerated -levaquin -seen by pulm -Continue with nebulizer Pulmicort. -Check blood cultures  -echo ordered  Bilateral chest infiltrates - abx  Hypertension - continue present medication.  Recent thoracic surgery for schwannoma - was seen by patient's neurosurgeon yesterday, PT eval  History of. PRE diabetes - closely follow CBGs and sliding scale as patient is on Solu-Medrol.  Mild anemia probably postoperative blood loss - follow CBC.  Code Status: full Family Communication: patient Disposition Plan: home 1-2 days   Consultants:  pulm  Procedures:      HPI/Subjective: Thinks breathing has improved Was getting PT at home and would like to continue here as to not lose her mobility  Objective: Filed Vitals:   01/28/14 0848  BP: 131/55  Pulse: 72  Temp: 97.6 F (36.4 C)  Resp: 18    Intake/Output Summary (Last 24 hours) at 01/28/14 1010 Last data filed at 01/28/14 0700  Gross per 24 hour  Intake    243 ml  Output    300 ml  Net    -57 ml   Filed Weights   01/27/14 0127 01/28/14 0514  Weight: 99.791 kg (220 lb) 103.828 kg (228 lb 14.4 oz)    Exam:   General:  A+Ox3, NAD  Cardiovascular: + murmur  Respiratory: wheezing- L> R  Abdomen: +BS, soft  Musculoskeletal: min edema   Data Reviewed: Basic Metabolic Panel:  Recent Labs Lab 01/27/14 0125 01/27/14 0810 01/28/14 0425  NA 130* 135* 140  K 3.6* 3.2* 4.5  CL 90* 93* 97  CO2 24 24 28   GLUCOSE 156* 292* 146*  BUN 10 10 14   CREATININE 0.62 0.58 0.68  CALCIUM 9.1 9.6 9.9   Liver Function Tests:  Recent Labs Lab 01/27/14 0810  AST 19  ALT 46*  ALKPHOS 142*  BILITOT 0.3  PROT 7.5  ALBUMIN 3.4*   No results for input(s): LIPASE, AMYLASE in the last 168 hours. No results for  input(s): AMMONIA in the last 168 hours. CBC:  Recent Labs Lab 01/27/14 0125 01/27/14 0810 01/28/14 0425  WBC 20.8* 17.1* 22.3*  NEUTROABS 15.1* 16.3*  --   HGB 10.6* 10.9* 10.1*  HCT 33.5* 34.3* 32.7*  MCV 83.3 85.1 84.3  PLT 384 389 434*   Cardiac Enzymes:  Recent Labs Lab 01/27/14 0125 01/27/14 0810  TROPONINI <0.30 <0.30   BNP (last 3 results)  Recent Labs  01/27/14 0125  PROBNP 141.5*   CBG:  Recent Labs Lab 01/27/14 0929 01/27/14 1142 01/27/14 1630 01/27/14 1949 01/28/14 0752  GLUCAP 257* 283* 148* 145* 163*    Recent Results (from the past 240 hour(s))  MRSA PCR Screening     Status: None   Collection Time: 01/27/14  7:26 AM  Result Value Ref Range Status   MRSA by PCR NEGATIVE NEGATIVE Final    Comment:        The GeneXpert MRSA Assay (FDA approved for NASAL specimens only), is one component of a comprehensive MRSA colonization surveillance program. It is not intended to diagnose MRSA infection nor to guide or monitor treatment for MRSA infections.   Culture, blood (routine x 2)     Status: None (Preliminary result)   Collection Time: 01/27/14  8:10 AM  Result Value Ref Range Status   Specimen Description BLOOD LEFT ANTECUBITAL  Final  Special Requests BOTTLES DRAWN AEROBIC AND ANAEROBIC 10CCS  Final   Culture  Setup Time   Final    01/27/2014 14:31 Performed at [facility]    Culture   Final           BLOOD CULTURE RECEIVED NO GROWTH TO DATE CULTURE WILL BE HELD FOR 5 DAYS BEFORE ISSUING A FINAL NEGATIVE REPORT Performed at [facility]    Report Status PENDING  Incomplete  Culture, blood (routine x 2)     Status: None (Preliminary result)   Collection Time: 01/27/14  8:20 AM  Result Value Ref Range Status   Specimen Description BLOOD LEFT ARM  Final   Special Requests BOTTLES DRAWN AEROBIC AND ANAEROBIC 10CCS  Final   Culture  Setup Time   Final    01/27/2014 14:31 Performed at [facility]    Culture    Final           BLOOD CULTURE RECEIVED NO GROWTH TO DATE CULTURE WILL BE HELD FOR 5 DAYS BEFORE ISSUING A FINAL NEGATIVE REPORT Performed at [facility]    Report Status PENDING  Incomplete     Studies: Dg Chest 2 View  01/28/2014   CLINICAL DATA:  Cough.  EXAM: CHEST  2 VIEW  COMPARISON:  CT chest 01/29/2014. Chest x-ray 01/26/2014, 08/11/2010.  FINDINGS: Mediastinum and hilar structures normal. Heart size stable. Pulmonary vascularity normal. Stable very mild bilateral interstitial prominence, particularly in the upper lobes again noted. Findings again consistent with mild pneumonitis. Stable pulmonary nodule right mid lung, and unchanged from prior chest x-ray 08/11/2010. This consistent with benign pulmonary nodule. No pleural effusion or pneumothorax.  IMPRESSION: 1. Stable very mild interstitial infiltrates, particularly in the upper lobes, unchanged from prior CT of 01/27/2014 and consistent with very mild changes of pneumonitis. 2. Stable pulmonary nodule right mid lung, unchanged from prior chest x-ray 08/11/2010. This is consistent with a benign pulmonary nodule.   Electronically Signed   By: Marcello Moores  Register   On: 01/28/2014 07:57   Ct Angio Chest Pe W/cm &/or Wo Cm  01/27/2014   CLINICAL DATA:  Shortness of breath  EXAM: CT ANGIOGRAPHY CHEST WITH CONTRAST  TECHNIQUE: Multidetector CT imaging of the chest was performed using the standard protocol during bolus administration of intravenous contrast. Multiplanar CT image reconstructions and MIPs were obtained to evaluate the vascular anatomy.  CONTRAST:  100 cc Omnipaque 350 intravenous  COMPARISON:  None.  FINDINGS: THORACIC INLET/BODY WALL:  No acute abnormality.  MEDIASTINUM:  Normal heart size. No pericardial effusion. Coronary atherosclerosis, multi focal. No acute vascular abnormality. No adenopathy. There are calcified granulomatous changes to the left hilar lymph nodes. Pulmonary angiography is limited by intermittent  respiratory motion, but the exam is overall diagnostic. No evidence of pulmonary embolism. No aortic dissection.  LUNG WINDOWS:  There is patchy ground-glass density in the bilateral upper lungs. There is diffuse bronchial wall thickening with scattered mucoid impaction and possible mild scattered bronchiar impaction. There is a 1 cm pulmonary nodule in the right middle lobe which is stable from 2012 chest radiography and considered benign. Small calcified nodule in the left lower lobe.  UPPER ABDOMEN:  No acute findings.  OSSEOUS:  Recent C7-T3 laminectomy for resection of a schwannoma. There is epidural gas throughout the laminectomy site, possibly retained within hemostatic material. Partially visualized incisional fluid collection without peripheral enhancement, measuring 5 x 7 by at least 7 cm.  Review of the MIP images confirms the  above findings.  IMPRESSION: 1. Negative for pulmonary embolism. 2. Patchy ground-glass densities could reflect an atypical pneumonia or inflammatory pneumonitis. There is associated bronchitis. 3. Recent C7 the T3 laminectomy for tumor resection. There is a sizable incisional fluid collection and residual epidural gas.   Electronically Signed   By: Jorje Guild M.D.   On: 01/27/2014 05:13    Scheduled Meds: . budesonide (PULMICORT) nebulizer solution  0.25 mg Nebulization BID  . cholecalciferol  2,000 Units Oral Daily  . enoxaparin (LOVENOX) injection  40 mg Subcutaneous Q24H  . famotidine  20 mg Oral Daily  . hydrochlorothiazide  25 mg Oral Daily  . insulin aspart  0-9 Units Subcutaneous TID WC  . ipratropium-albuterol  3 mL Nebulization Q6H  . levofloxacin  500 mg Oral Daily  . methylPREDNISolone (SOLU-MEDROL) injection  40 mg Intravenous Q12H  . montelukast  10 mg Oral QHS  . pantoprazole  40 mg Oral Daily  . sertraline  150 mg Oral QAC breakfast  . sodium chloride  3 mL Intravenous Q12H  . sodium chloride  3 mL Intravenous Q12H   Continuous Infusions:    Antibiotics Given (last 72 hours)    Date/Time Action Medication Dose Rate   01/27/14 1331 Given   ceFEPIme (MAXIPIME) 1 g in dextrose 5 % 50 mL IVPB 1 g 100 mL/hr   01/27/14 1533 Given   levofloxacin (LEVAQUIN) tablet 500 mg 500 mg       Principal Problem:   Acute respiratory failure with hypoxia Active Problems:   Asthma   GERD   Anxiety and depression   Asthma exacerbation   Pneumonia, organism unspecified   Essential hypertension   Leucocytosis   Aspiration pneumonia    Time spent: 25 min    Tamiyah Moulin, [place] Pager [phone]. If 7PM-7AM, please contact night-coverage at [email or web address], password [facility] 01/28/2014, 10:10 AM  LOS: 1 day

## 2014-01-28 NOTE — Evaluation (Signed)
Physical Therapy Evaluation Patient Details Name: Vanessa Middleton MRN: [id number] DOB: [birth year] Today's Date: 01/28/2014   History of Present Illness  58 y.o. female admitted with acute hypoxic respiratory failure. Recent history of C7-T3 laminectomy for tumor removal; Discharged from hospital 01/18/2014.  Clinical Impression  Pt admitted with above diagnosis. Pt currently with functional limitations due to the deficits listed below (see PT Problem List). Moderately ataxic gait requiring rolling walker and close guard assist for safety. Able to ambulate up to 150 feet today on room air, SpO2 down to 93% at lowest reading. Educated on safety with mobility and therapeutic exercises for improved motor control. Pt will benefit from skilled PT to increase their independence and safety with mobility to allow discharge to the venue listed below.       Follow Up Recommendations Home health PT;Supervision for mobility/OOB    Equipment Recommendations  Rolling walker with 5" wheels    Recommendations for Other Services       Precautions / Restrictions Precautions Precautions: Cervical;Fall Precaution Comments: Reviewed precautions Restrictions Weight Bearing Restrictions: No      Mobility  Bed Mobility Overal bed mobility: Modified Independent                Transfers Overall transfer level: Needs assistance Equipment used: Rolling walker (2 wheeled) Transfers: Sit to/from Stand Sit to Stand: Supervision         General transfer comment: Supervision for safety from lowest bed setting. Correctly places hands on stable surface to rise. Good stability once holding RW.  Ambulation/Gait Ambulation/Gait assistance: Min guard Ambulation Distance (Feet): 150 Feet Assistive device: Rolling walker (2 wheeled) Gait Pattern/deviations: Step-through pattern;Ataxic Gait velocity: decreased   General Gait Details: Educated on safe DME use with a rolling walker. VC for walker  placement. RW adjusted for height appropriately. Mild buckling of knees but able to self correct. No loss of balance. close guard at all times for safety. SpO2 down to 93% at lowest on room air. Improves with pursed lip breathing and rest breaks.  Stairs            Wheelchair Mobility    Modified Rankin (Stroke Patients Only)       Balance Overall balance assessment: Needs assistance;History of Falls Sitting-balance support: No upper extremity supported;Feet supported Sitting balance-Leahy Scale: Good     Standing balance support: Bilateral upper extremity supported Standing balance-Leahy Scale: Poor                               Pertinent Vitals/Pain Pain Assessment: 0-10 Pain Score: 4  Pain Location: neck Pain Descriptors / Indicators: Constant;Aching Pain Intervention(s): Monitored during session;Repositioned;Premedicated before session    [place] expects to be discharged to:: Private residence Living Arrangements: Children Available Help at Discharge: Family;Available 24 hours/day Type of Home: Other(Comment) (townhome) Home Access: Level entry     Home Layout: Two level;Able to live on main level with bedroom/bathroom Home Equipment: [name] - single point;Walker - 2 wheels;Tub bench Additional Comments: Sister will stay with patient to (A) for as long as needed    Prior Function Level of Independence: Needs assistance   Gait / Transfers Assistance Needed: using a rolling walker since previous admission  ADL's / Homemaking Assistance Needed: Sister was assisting with bathing minimally.        Hand Dominance   Dominant Hand: Right    Extremity/Trunk Assessment   Upper Extremity Assessment: Defer to  OT evaluation           Lower Extremity Assessment: Generalized weakness (LE paresthesia)         Communication   Communication: No difficulties  Cognition Arousal/Alertness: Awake/alert Behavior During Therapy: WFL  for tasks assessed/performed Overall Cognitive Status: Within Functional Limits for tasks assessed                      General Comments      Exercises Other Exercises Other Exercises: Pt educated on ankle pumps, and techniques for improved control/coordination with LE exercises.      Assessment/Plan    PT Assessment Patient needs continued PT services  PT Diagnosis Difficulty walking;Generalized weakness;Abnormality of gait   PT Problem List Decreased strength;Decreased range of motion;Decreased activity tolerance;Decreased balance;Decreased mobility;Decreased coordination;Decreased knowledge of use of DME;Decreased knowledge of precautions;Impaired sensation;Impaired tone;Pain  PT Treatment Interventions DME instruction;Gait training;Stair training;Functional mobility training;Therapeutic activities;Therapeutic exercise;Balance training;Neuromuscular re-education;Patient/family education   PT Goals (Current goals can be found in the Care Plan section) Acute Rehab PT Goals Patient Stated Goal: to go home PT Goal Formulation: With patient/family Time For Goal Achievement: 02/11/14 Potential to Achieve Goals: Good    Frequency Min 5X/week   Barriers to discharge        Co-evaluation               End of Session Equipment Utilized During Treatment: Gait belt Activity Tolerance: Patient tolerated treatment well Patient left: in bed;with call bell/phone within reach;with family/visitor present Nurse Communication: Mobility status         Time: 1530-1556 PT Time Calculation (min) (ACUTE ONLY): 26 min   Charges:   PT Evaluation $Initial PT Evaluation Tier I: 1 Procedure PT Treatments $Gait Training: 8-22 mins   PT G CodesEllouise Middleton 01/28/2014, 4:37 PM  Vanessa Middleton [place], [place]

## 2014-01-29 DIAGNOSIS — J4541 Moderate persistent asthma with (acute) exacerbation: Secondary | ICD-10-CM

## 2014-01-29 LAB — CBC
HCT: 31.9 % — ABNORMAL LOW (ref 36.0–46.0)
HEMOGLOBIN: 10 g/dL — AB (ref 12.0–15.0)
MCH: 26.1 pg (ref 26.0–34.0)
MCHC: 31.3 g/dL (ref 30.0–36.0)
MCV: 83.3 fL (ref 78.0–100.0)
PLATELETS: 436 10*3/uL — AB (ref 150–400)
RBC: 3.83 MIL/uL — AB (ref 3.87–5.11)
RDW: 14.2 % (ref 11.5–15.5)
WBC: 14.5 10*3/uL — ABNORMAL HIGH (ref 4.0–10.5)

## 2014-01-29 LAB — BASIC METABOLIC PANEL
ANION GAP: 13 (ref 5–15)
BUN: 18 mg/dL (ref 6–23)
CO2: 28 meq/L (ref 19–32)
Calcium: 9.6 mg/dL (ref 8.4–10.5)
Chloride: 100 mEq/L (ref 96–112)
Creatinine, Ser: 0.7 mg/dL (ref 0.50–1.10)
GFR calc Af Amer: >90 mL/min (ref >90)
GLUCOSE: 150 mg/dL — AB (ref 70–99)
POTASSIUM: 4.1 meq/L (ref 3.7–5.3)
Sodium: 141 mEq/L (ref 137–147)

## 2014-01-29 LAB — GLUCOSE, CAPILLARY
GLUCOSE-CAPILLARY: 153 mg/dL — AB (ref 70–99)
Glucose-Capillary: 140 mg/dL — ABNORMAL HIGH (ref 70–99)
Glucose-Capillary: 114 mg/dL — ABNORMAL HIGH (ref 70–99)

## 2014-01-29 MED ORDER — LEVOFLOXACIN 500 MG PO TABS: 500.0000 mg | ORAL_TABLET | Freq: Every day | ORAL | Status: DC

## 2014-01-29 MED ORDER — PREDNISONE 10 MG PO TABS: ORAL_TABLET | ORAL | Status: DC

## 2014-01-29 MED ORDER — PREDNISONE 20 MG PO TABS
50.0000 mg | ORAL_TABLET | Freq: Every day | ORAL | Status: DC
  Administered 2014-01-29: 50 mg via ORAL
  Filled 2014-01-29: qty 1
  Filled 2014-01-29: qty 2

## 2014-01-29 NOTE — Discharge Summary (Signed)
Physician Discharge Summary  Vanessa Middleton WCH:[id number] DOB: August 08, 1955 DOA: 01/27/2014  PCP: Kathlene November, MD  Admit date: 01/27/2014 Discharge date: 01/29/2014  Time spent: 35 minutes  Recommendations for Outpatient Follow-up:  Refused home health resumption Needs pulm f/up  Discharge Diagnoses:  Principal Problem:   Acute respiratory failure with hypoxia Active Problems:   Asthma   GERD   Anxiety and depression   Asthma exacerbation   Pneumonia, organism unspecified   Essential hypertension   Leucocytosis   Aspiration pneumonia   Discharge Condition: improved, anxious to go home  Diet recommendation: cardiac  Filed Weights   01/27/14 0127 01/28/14 0514 01/29/14 0500  Weight: 99.791 kg (220 lb) 103.828 kg (228 lb 14.4 oz) 102.331 kg (225 lb 9.6 oz)    History of present illness:  Vanessa Middleton is a 58 y.o. 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 Patient had gone to patient's neurosurgeon yesterday for follow-up and over the patient was found to be wheezing and was referred to primary care physician. Her primary care physician had given injection of Solu-Medrol and and breathing treatment. Patient presented to the ER because of worsening shortness of breath and CT angiogram of the chest was negative for PE but did show bilateral infiltrates concerning for pneumonitis versus atypical infection. Patient in the ER was found to be wheezing and diaphoretic. Labs show leukocytosis. Patient has been afebrile otherwise. Patient is requiring 4 L of oxygen and patient usually on no oxygen at home. Patient has been restarted on antibiotics and placed on Solu-Medrol and nebulizer and admitted for acute respiratory failure probably from asthma and possible pneumonia. Patient denies any chest pain nausea vomiting abdominal pain diarrhea fever chills though  patient does have diaphoresis.   Hospital Course:  Acute hypoxic respiratory failure -  -Solu-Medrol - change to PO  -no need for home O2 -levaquin x 7 days -seen by pulm -Continue with nebulizer Pulmicort. -Check blood cultures  -echo ok  Bilateral chest infiltrates - abx  Hypertension - continue present medication.  Recent thoracic surgery for schwannoma - was seen by patient's neurosurgeon yesterday, PT eval- patient refused at home  History of. PRE diabetes - diet changes  Mild anemia probably postoperative blood loss - follow CBC periodically  Procedures:  echo  Consultations:  pulm  Discharge Exam: Filed Vitals:   01/29/14 1518  BP: 151/76  Pulse: 79  Temp: 98.1 F (36.7 C)  Resp:       Discharge Instructions You were cared for by a hospitalist during your hospital stay. If you have any questions about your discharge medications or the care you received while you were in the hospital after you are discharged, you can call the unit and asked to speak with the hospitalist on call if the hospitalist that took care of you is not available. Once you are discharged, your primary care physician will handle any further medical issues. Please note that NO REFILLS for any discharge medications will be authorized once you are discharged, as it is imperative that you return to your primary care physician (or establish a relationship with a primary care physician if you do not have one) for your aftercare needs so that they can reassess your need for medications and monitor your lab values.  Discharge Instructions    Diet - low sodium heart healthy    Complete by:  As directed  Diet Carb Modified    Complete by:  As directed      Discharge instructions    Complete by:  As directed   Home health Need to follow up with pulm     Increase activity slowly    Complete by:  As directed           Current Discharge Medication List    START taking these medications    Details  levofloxacin (LEVAQUIN) 500 MG tablet Take 1 tablet (500 mg total) by mouth daily. Qty: 4 tablet, Refills: 0      CONTINUE these medications which have CHANGED   Details  predniSONE (DELTASONE) 10 MG tablet 40 mg PO x 3 days, 30 mg x3 days, 20 mg x 3 days, 10 mg x 3 days then d/c Qty: 30 tablet, Refills: 0      CONTINUE these medications which have NOT CHANGED   Details  acetaminophen (TYLENOL) 500 MG tablet Take 500-1,000 mg by mouth every 6 (six) hours as needed for mild pain.     albuterol (VENTOLIN HFA) 108 (90 BASE) MCG/ACT inhaler INHALE 2 PUFFS BY MOUTH EVERY 6 HOURS AS NEEDED FOR COUGH OR WHEEZING Qty: 18 g, Refills: 1    ALPRAZolam (XANAX) 0.5 MG tablet TAKE 1 TABLET BY MOUTH THREE TIMES DAILY AS NEEDED Qty: 60 tablet, Refills: 0    cholecalciferol (VITAMIN D) 1000 UNITS tablet Take 2,000 Units by mouth daily.      guaiFENesin (MUCINEX) 600 MG 12 hr tablet Take 1 tablet (600 mg total) by mouth 2 (two) times daily. Qty: 30 tablet, Refills: 0   Associated Diagnoses: Wheezing; Acute bronchitis, unspecified organism    hydrochlorothiazide (HYDRODIURIL) 25 MG tablet TAKE 1 TABLET BY MOUTH DAILY Qty: 90 tablet, Refills: 1    levonorgestrel (MIRENA) 20 MCG/24HR IUD 1 each by Intrauterine route once.    Melatonin 5 MG TABS Take 5 mg by mouth daily as needed (sleep).     montelukast (SINGULAIR) 10 MG tablet TAKE 1 TABLET BY MOUTH EVERY NIGHT AT BEDTIME Qty: 90 tablet, Refills: 0    omeprazole (PRILOSEC) 40 MG capsule TAKE ONE CAPSULE BY MOUTH EVERY MORNING Qty: 90 capsule, Refills: 1    oxyCODONE-acetaminophen (PERCOCET/ROXICET) 5-325 MG per tablet Take 1-2 tablets by mouth every 4 (four) hours as needed for moderate pain. Qty: 90 tablet, Refills: 0    ranitidine (ZANTAC) 300 MG tablet TAKE 1 TABLET BY MOUTH EVERY NIGHT AT BEDTIME Qty: 30 tablet, Refills: 6    sertraline (ZOLOFT) 100 MG tablet Take 150 mg by mouth daily before breakfast.     SYMBICORT  160-4.5 MCG/ACT inhaler INHALE 2 PUFFS BY MOUTH TWICE DAILY Qty: 10.2 g, Refills: 2    tiZANidine (ZANAFLEX) 4 MG capsule Take 4 mg by mouth 3 (three) times daily as needed for muscle spasms.    mupirocin ointment (BACTROBAN) 2 % Refills: 0      STOP taking these medications     doxycycline (VIBRA-TABS) 100 MG tablet      ibuprofen (ADVIL,MOTRIN) 200 MG tablet      tiZANidine (ZANAFLEX) 4 MG tablet      methocarbamol (ROBAXIN) 500 MG tablet        No Known Allergies Follow-up Information    Follow up with PARRETT,TAMMY, NP On 02/11/2014.   Specialty:  Nurse Practitioner   Why:  12pm   Contact information:   [address] [place] [address] [phone]       Follow up with  Kathlene November, MD In 1 week.   Specialty:  Internal Medicine   Contact information:   [address] [place] [address] [phone]        The results of significant diagnostics from this hospitalization (including imaging, microbiology, ancillary and laboratory) are listed below for reference.    Significant Diagnostic Studies: Dg Chest 2 View  01/28/2014   CLINICAL DATA:  Cough.  EXAM: CHEST  2 VIEW  COMPARISON:  CT chest 01/29/2014. Chest x-ray 01/26/2014, 08/11/2010.  FINDINGS: Mediastinum and hilar structures normal. Heart size stable. Pulmonary vascularity normal. Stable very mild bilateral interstitial prominence, particularly in the upper lobes again noted. Findings again consistent with mild pneumonitis. Stable pulmonary nodule right mid lung, and unchanged from prior chest x-ray 08/11/2010. This consistent with benign pulmonary nodule. No pleural effusion or pneumothorax.  IMPRESSION: 1. Stable very mild interstitial infiltrates, particularly in the upper lobes, unchanged from prior CT of 01/27/2014 and consistent with very mild changes of pneumonitis. 2. Stable pulmonary nodule right mid lung, unchanged from prior chest x-ray 08/11/2010. This is consistent with a benign  pulmonary nodule.   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  EXAM: CHEST - 2 VIEW  COMPARISON:  08/11/2010  FINDINGS: The heart size and mediastinal contours are within normal limits. Stable calcified granuloma of the right lung. There is no evidence of pulmonary edema, consolidation, pneumothorax or pleural fluid. The visualized skeletal structures are unremarkable.  IMPRESSION: Stable right lung granuloma.  No active disease.   Electronically Signed   By: Aletta Edouard M.D.   On: 01/11/2014 13:46   Ct Angio Chest Pe W/cm &/or Wo Cm  01/27/2014   CLINICAL DATA:  Shortness of breath  EXAM: CT ANGIOGRAPHY CHEST WITH CONTRAST  TECHNIQUE: Multidetector CT imaging of the chest was performed using the standard protocol during bolus administration of intravenous contrast. Multiplanar CT image reconstructions and MIPs were obtained to evaluate the vascular anatomy.  CONTRAST:  100 cc Omnipaque 350 intravenous  COMPARISON:  None.  FINDINGS: THORACIC INLET/BODY WALL:  No acute abnormality.  MEDIASTINUM:  Normal heart size. No pericardial effusion. Coronary atherosclerosis, multi focal. No acute vascular abnormality. No adenopathy. There are calcified granulomatous changes to the left hilar lymph nodes. Pulmonary angiography is limited by intermittent respiratory motion, but the exam is overall diagnostic. No evidence of pulmonary embolism. No aortic dissection.  LUNG WINDOWS:  There is patchy ground-glass density in the bilateral upper lungs. There is diffuse bronchial wall thickening with scattered mucoid impaction and possible mild scattered bronchiar impaction. There is a 1 cm pulmonary nodule in the right middle lobe which is stable from 2012 chest radiography and considered benign. Small calcified nodule in the left lower lobe.  UPPER ABDOMEN:  No acute findings.   OSSEOUS:  Recent C7-T3 laminectomy for resection of a schwannoma. There is epidural gas throughout the laminectomy site, possibly retained within hemostatic material. Partially visualized incisional fluid collection without peripheral enhancement, measuring 5 x 7 by at least 7 cm.  Review of the MIP images confirms the above findings.  IMPRESSION: 1. Negative for pulmonary embolism. 2. Patchy ground-glass densities could reflect an atypical pneumonia or inflammatory pneumonitis. There is associated bronchitis. 3. Recent C7 the T3 laminectomy for tumor resection. There is a sizable incisional fluid collection and residual epidural gas.  Electronically Signed   By: Jorje Guild M.D.   On: 01/27/2014 05:13   Dg C-arm 1-60 Min-no Report  01/19/2014   : Fluoroscopy was utilized by the requesting physician. No radiographic interpretation.   Electronically Signed   By: Porfirio Mylar   On: 01/19/2014 13:47    Microbiology: Recent Results (from the past 240 hour(s))  MRSA PCR Screening     Status: None   Collection Time: 01/27/14  7:26 AM  Result Value Ref Range Status   MRSA by PCR NEGATIVE NEGATIVE Final    Comment:        The GeneXpert MRSA Assay (FDA approved for NASAL specimens only), is one component of a comprehensive MRSA colonization surveillance program. It is not intended to diagnose MRSA infection nor to guide or monitor treatment for MRSA infections.   Culture, blood (routine x 2)     Status: None (Preliminary result)   Collection Time: 01/27/14  8:10 AM  Result Value Ref Range Status   Specimen Description BLOOD LEFT ANTECUBITAL  Final   Special Requests BOTTLES DRAWN AEROBIC AND ANAEROBIC 10CCS  Final   Culture  Setup Time   Final    01/27/2014 14:31 Performed at [facility]    Culture   Final           BLOOD CULTURE RECEIVED NO GROWTH TO DATE CULTURE WILL BE HELD FOR 5 DAYS BEFORE ISSUING A FINAL NEGATIVE REPORT Performed at [facility]    Report  Status PENDING  Incomplete  Culture, blood (routine x 2)     Status: None (Preliminary result)   Collection Time: 01/27/14  8:20 AM  Result Value Ref Range Status   Specimen Description BLOOD LEFT ARM  Final   Special Requests BOTTLES DRAWN AEROBIC AND ANAEROBIC 10CCS  Final   Culture  Setup Time   Final    01/27/2014 14:31 Performed at [facility]    Culture   Final           BLOOD CULTURE RECEIVED NO GROWTH TO DATE CULTURE WILL BE HELD FOR 5 DAYS BEFORE ISSUING A FINAL NEGATIVE REPORT Performed at [facility]    Report Status PENDING  Incomplete     Labs: Basic Metabolic Panel:  Recent Labs Lab 01/27/14 0125 01/27/14 0810 01/28/14 0425 01/29/14 0355  NA 130* 135* 140 141  K 3.6* 3.2* 4.5 4.1  CL 90* 93* 97 100  CO2 24 24 28 28   GLUCOSE 156* 292* 146* 150*  BUN 10 10 14 18   CREATININE 0.62 0.58 0.68 0.70  CALCIUM 9.1 9.6 9.9 9.6   Liver Function Tests:  Recent Labs Lab 01/27/14 0810  AST 19  ALT 46*  ALKPHOS 142*  BILITOT 0.3  PROT 7.5  ALBUMIN 3.4*   No results for input(s): LIPASE, AMYLASE in the last 168 hours. No results for input(s): AMMONIA in the last 168 hours. CBC:  Recent Labs Lab 01/27/14 0125 01/27/14 0810 01/28/14 0425 01/29/14 0355  WBC 20.8* 17.1* 22.3* 14.5*  NEUTROABS 15.1* 16.3*  --   --   HGB 10.6* 10.9* 10.1* 10.0*  HCT 33.5* 34.3* 32.7* 31.9*  MCV 83.3 85.1 84.3 83.3  PLT 384 389 434* 436*   Cardiac Enzymes:  Recent Labs Lab 01/27/14 0125 01/27/14 0810  TROPONINI <0.30 <0.30   BNP: BNP (last 3 results)  Recent Labs  01/27/14 0125  PROBNP 141.5*   CBG:  Recent Labs Lab 01/28/14 1640 01/28/14 2113 01/29/14 0734 01/29/14 1134  01/29/14 1709  GLUCAP 191* 94 140* 114* 153*       Signed:  Reise Gladney  [facility] 01/29/2014, 5:59 PM

## 2014-01-29 NOTE — Discharge Instructions (Signed)
Asthma Asthma is a condition of the lungs in which the airways tighten and narrow. Asthma can make it hard to breathe. Asthma cannot be cured, but medicine and lifestyle changes can help control it. Asthma may be started (triggered) by:  Animal skin flakes (dander).  Dust.  Cockroaches.  Pollen.  Mold.  Smoke.  Cleaning products.  Hair sprays or aerosol sprays.  Paint fumes or strong smells.  Cold air, weather changes, and winds.  Crying or laughing hard.  Stress.  Certain medicines or drugs.  Foods, such as dried fruit, potato chips, and sparkling grape juice.  Infections or conditions (colds, flu).  Exercise.  Certain medical conditions or diseases.  Exercise or tiring activities. HOME CARE   Take medicine as told by your doctor.  Use a peak flow meter as told by your doctor. A peak flow meter is a tool that measures how well the lungs are working.  Record and keep track of the peak flow meter's readings.  Understand and use the asthma action plan. An asthma action plan is a written plan for taking care of your asthma and treating your attacks.  To help prevent asthma attacks:  Do not smoke. Stay away from secondhand smoke.  Change your heating and air conditioning filter often.  Limit your use of fireplaces and wood stoves.  Get rid of pests (such as roaches and mice) and their droppings.  Throw away plants if you see mold on them.  Clean your floors. Dust regularly. Use cleaning products that do not smell.  Have someone vacuum when you are not home. Use a vacuum cleaner with a HEPA filter if possible.  Replace carpet with wood, tile, or vinyl flooring. Carpet can trap animal skin flakes and dust.  Use allergy-proof pillows, mattress covers, and box spring covers.  Wash bed sheets and blankets every week in hot water and dry them in a dryer.  Use blankets that are made of polyester or cotton.  Clean bathrooms and kitchens with bleach. If  possible, have someone repaint the walls in these rooms with mold-resistant paint. Keep out of the rooms that are being cleaned and painted.  Wash hands often. GET HELP IF:  You have make a whistling sound when breaking (wheeze), have shortness of breath, or have a cough even if taking medicine to prevent attacks.  The colored mucus you cough up (sputum) is thicker than usual.  The colored mucus you cough up changes from clear or white to yellow, green, gray, or bloody.  You have problems from the medicine you are taking such as:  A rash.  Itching.  Swelling.  Trouble breathing.  You need reliever medicines more than 2-3 times a week.  Your peak flow measurement is still at 50-79% of your personal best after following the action plan for 1 hour.  You have a fever. GET HELP RIGHT AWAY IF:   You seem to be worse and are not responding to medicine during an asthma attack.  You are short of breath even at rest.  You get short of breath when doing very little activity.  You have trouble eating, drinking, or talking.  You have chest pain.  You have a fast heartbeat.  Your lips or fingernails start to turn blue.  You are light-headed, dizzy, or faint.  Your peak flow is less than 50% of your personal best. MAKE SURE YOU:   Understand these instructions.  Will watch your condition.  Will get help right away if you  are not doing well or get worse. Document Released: 07/18/2007 Document Revised: 06/15/2013 Document Reviewed: 08/28/2012 [facility] Patient Information 2015 [place], [place]. This information is not intended to replace advice given to you by your health care provider. Make sure you discuss any questions you have with your health care provider.  Cough, Adult  A cough is a reflex that helps clear your throat and airways. It can help heal the body or may be a reaction to an irritated airway. A cough may only last 2 or 3 weeks (acute) or may last more than 8 weeks  (chronic).  CAUSES Acute cough:  Viral or bacterial infections. Chronic cough:  Infections.  Allergies.  Asthma.  Post-nasal drip.  Smoking.  Heartburn or acid reflux.  Some medicines.  Chronic lung problems (COPD).  Cancer. SYMPTOMS   Cough.  Fever.  Chest pain.  Increased breathing rate.  High-pitched whistling sound when breathing (wheezing).  Colored mucus that you cough up (sputum). TREATMENT   A bacterial cough may be treated with antibiotic medicine.  A viral cough must run its course and will not respond to antibiotics.  Your caregiver may recommend other treatments if you have a chronic cough. HOME CARE INSTRUCTIONS   Only take over-the-counter or prescription medicines for pain, discomfort, or fever as directed by your caregiver. Use cough suppressants only as directed by your caregiver.  Use a cold steam vaporizer or humidifier in your bedroom or home to help loosen secretions.  Sleep in a semi-upright position if your cough is worse at night.  Rest as needed.  Stop smoking if you smoke. SEEK IMMEDIATE MEDICAL CARE IF:   You have pus in your sputum.  Your cough starts to worsen.  You cannot control your cough with suppressants and are losing sleep.  You begin coughing up blood.  You have difficulty breathing.  You develop pain which is getting worse or is uncontrolled with medicine.  You have a fever. MAKE SURE YOU:   Understand these instructions.  Will watch your condition.  Will get help right away if you are not doing well or get worse. Document Released: 07/28/2010 Document Revised: 04/23/2011 Document Reviewed: 07/28/2010 [facility] Patient Information 2015 [place], [place]. This information is not intended to replace advice given to you by your health care provider. Make sure you discuss any questions you have with your health care provider.

## 2014-01-29 NOTE — Progress Notes (Signed)
Spoke with patient concerning home health physical therapy.  Patient states she was to start receiving HHPT this week from her neurosurgeon for her recent back surgery.  She does not remember the name of her agency and request that we not set up HHPT at this time from the hospital.  She states she will call her neurosurgeon on Monday to make her home health physical therapy arrangements. Dr. Eliseo Squires updated.  Sanda Linger

## 2014-01-29 NOTE — Progress Notes (Signed)
Marland Kitchen  PROGRESS NOTE  Vanessa Middleton JKK:[id number] DOB: [birth year] DOA: 01/27/2014 PCP: Kathlene November, MD  Assessment/Plan: Acute hypoxic respiratory failure -  -Solu-Medrol - change to PO  -wean O2 as tolerated- off currently check home O2 eval -levaquin x 7 days -seen by pulm -Continue with nebulizer Pulmicort. -Check blood cultures  -echo ok  Bilateral chest infiltrates - abx  Hypertension - continue present medication.  Recent thoracic surgery for schwannoma - was seen by patient's neurosurgeon yesterday, PT eval  History of. PRE diabetes - closely follow CBGs and sliding scale as patient is on Solu-Medrol.  Mild anemia probably postoperative blood loss - follow CBC.  Code Status: full Family Communication: patient Disposition Plan: home in AM?   Consultants:  pulm  Procedures:      HPI/Subjective: Breathing better Off O2  Objective: Filed Vitals:   01/29/14 0500  BP: 135/76  Pulse: 90  Temp: 98 F (36.7 C)  Resp: 20   No intake or output data in the 24 hours ending 01/29/14 0824 Filed Weights   01/27/14 0127 01/28/14 0514 01/29/14 0500  Weight: 99.791 kg (220 lb) 103.828 kg (228 lb 14.4 oz) 102.331 kg (225 lb 9.6 oz)    Exam:   General:  A+Ox3, NAD  Cardiovascular: rrr  Respiratory: clearer  Abdomen: +BS, soft  Musculoskeletal: min edema   Data Reviewed: Basic Metabolic Panel:  Recent Labs Lab 01/27/14 0125 01/27/14 0810 01/28/14 0425 01/29/14 0355  NA 130* 135* 140 141  K 3.6* 3.2* 4.5 4.1  CL 90* 93* 97 100  CO2 24 24 28 28   GLUCOSE 156* 292* 146* 150*  BUN 10 10 14 18   CREATININE 0.62 0.58 0.68 0.70  CALCIUM 9.1 9.6 9.9 9.6   Liver Function Tests:  Recent Labs Lab 01/27/14 0810  AST 19  ALT 46*  ALKPHOS 142*  BILITOT 0.3  PROT 7.5  ALBUMIN 3.4*   No results for input(s): LIPASE, AMYLASE in the last 168 hours. No results for input(s): AMMONIA in the last 168 hours. CBC:  Recent Labs Lab 01/27/14 0125  01/27/14 0810 01/28/14 0425 01/29/14 0355  WBC 20.8* 17.1* 22.3* 14.5*  NEUTROABS 15.1* 16.3*  --   --   HGB 10.6* 10.9* 10.1* 10.0*  HCT 33.5* 34.3* 32.7* 31.9*  MCV 83.3 85.1 84.3 83.3  PLT 384 389 434* 436*   Cardiac Enzymes:  Recent Labs Lab 01/27/14 0125 01/27/14 0810  TROPONINI <0.30 <0.30   BNP (last 3 results)  Recent Labs  01/27/14 0125  PROBNP 141.5*   CBG:  Recent Labs Lab 01/28/14 0752 01/28/14 1121 01/28/14 1640 01/28/14 2113 01/29/14 0734  GLUCAP 163* 114* 191* 94 140*    Recent Results (from the past 240 hour(s))  MRSA PCR Screening     Status: None   Collection Time: 01/27/14  7:26 AM  Result Value Ref Range Status   MRSA by PCR NEGATIVE NEGATIVE Final    Comment:        The GeneXpert MRSA Assay (FDA approved for NASAL specimens only), is one component of a comprehensive MRSA colonization surveillance program. It is not intended to diagnose MRSA infection nor to guide or monitor treatment for MRSA infections.   Culture, blood (routine x 2)     Status: None (Preliminary result)   Collection Time: 01/27/14  8:10 AM  Result Value Ref Range Status   Specimen Description BLOOD LEFT ANTECUBITAL  Final   Special Requests BOTTLES DRAWN AEROBIC AND ANAEROBIC 10CCS  Final  Culture  Setup Time   Final    01/27/2014 14:31 Performed at [facility]    Culture   Final           BLOOD CULTURE RECEIVED NO GROWTH TO DATE CULTURE WILL BE HELD FOR 5 DAYS BEFORE ISSUING A FINAL NEGATIVE REPORT Performed at [facility]    Report Status PENDING  Incomplete  Culture, blood (routine x 2)     Status: None (Preliminary result)   Collection Time: 01/27/14  8:20 AM  Result Value Ref Range Status   Specimen Description BLOOD LEFT ARM  Final   Special Requests BOTTLES DRAWN AEROBIC AND ANAEROBIC 10CCS  Final   Culture  Setup Time   Final    01/27/2014 14:31 Performed at [facility]    Culture   Final           BLOOD CULTURE  RECEIVED NO GROWTH TO DATE CULTURE WILL BE HELD FOR 5 DAYS BEFORE ISSUING A FINAL NEGATIVE REPORT Performed at [facility]    Report Status PENDING  Incomplete     Studies: Dg Chest 2 View  01/28/2014   CLINICAL DATA:  Cough.  EXAM: CHEST  2 VIEW  COMPARISON:  CT chest 01/29/2014. Chest x-ray 01/26/2014, 08/11/2010.  FINDINGS: Mediastinum and hilar structures normal. Heart size stable. Pulmonary vascularity normal. Stable very mild bilateral interstitial prominence, particularly in the upper lobes again noted. Findings again consistent with mild pneumonitis. Stable pulmonary nodule right mid lung, and unchanged from prior chest x-ray 08/11/2010. This consistent with benign pulmonary nodule. No pleural effusion or pneumothorax.  IMPRESSION: 1. Stable very mild interstitial infiltrates, particularly in the upper lobes, unchanged from prior CT of 01/27/2014 and consistent with very mild changes of pneumonitis. 2. Stable pulmonary nodule right mid lung, unchanged from prior chest x-ray 08/11/2010. This is consistent with a benign pulmonary nodule.   Electronically Signed   By: Marcello Moores  Register   On: 01/28/2014 07:57    Scheduled Meds: . budesonide (PULMICORT) nebulizer solution  0.25 mg Nebulization BID  . cholecalciferol  2,000 Units Oral Daily  . enoxaparin (LOVENOX) injection  40 mg Subcutaneous Q24H  . hydrochlorothiazide  25 mg Oral Daily  . insulin aspart  0-9 Units Subcutaneous TID WC  . ipratropium-albuterol  3 mL Nebulization Q6H  . levofloxacin  500 mg Oral Daily  . montelukast  10 mg Oral QHS  . pantoprazole  40 mg Oral Daily  . predniSONE  50 mg Oral Q breakfast  . sertraline  150 mg Oral QAC breakfast  . sodium chloride  3 mL Intravenous Q12H  . sodium chloride  3 mL Intravenous Q12H   Continuous Infusions:  Antibiotics Given (last 72 hours)    Date/Time Action Medication Dose Rate   01/27/14 1331 Given   ceFEPIme (MAXIPIME) 1 g in dextrose 5 % 50 mL IVPB 1 g 100  mL/hr   01/27/14 1533 Given   levofloxacin (LEVAQUIN) tablet 500 mg 500 mg    01/28/14 1044 Given   levofloxacin (LEVAQUIN) tablet 500 mg 500 mg       Principal Problem:   Acute respiratory failure with hypoxia Active Problems:   Asthma   GERD   Anxiety and depression   Asthma exacerbation   Pneumonia, organism unspecified   Essential hypertension   Leucocytosis   Aspiration pneumonia    Time spent: 25 min    Tarika Mckethan, [place] Pager [phone]. If 7PM-7AM, please contact night-coverage at [email or web address], password  [email or web address] 01/29/2014, 8:24 AM  LOS: 2 days

## 2014-01-29 NOTE — Progress Notes (Signed)
Physical Therapy Treatment Patient Details Name: JAKLYN ALEN MRN: [id number] DOB: 1955/02/29 Today's Date: 01/29/2014    History of Present Illness 58 y.o. female admitted with acute hypoxic respiratory failure. Recent history of C7-T3 laminectomy for tumor removal; Discharged from hospital 01/18/2014.    PT Comments    Patient making good progress towards physical therapy goals, ambulating up to 300 feet today with a rolling walker; SpO2 maintaining 95% and greater on room air. Tolerated therapeutic exercises well. Patient will continue to benefit from skilled physical therapy services at home with HHPT to further improve independence with functional mobility.   Follow Up Recommendations  Home health PT;Supervision for mobility/OOB     Equipment Recommendations  Rolling walker with 5" wheels    Recommendations for Other Services       Precautions / Restrictions Precautions Precautions: Cervical;Fall Precaution Comments: Reviewed precautions Restrictions Weight Bearing Restrictions: No    Mobility  Bed Mobility Overal bed mobility: Modified Independent                Transfers Overall transfer level: Needs assistance Equipment used: Rolling walker (2 wheeled) Transfers: Sit to/from Stand Sit to Stand: Supervision         General transfer comment: Supervision for safety. Performed from lowest bed setting and toilet with cues for use of hand rail to stand.  Ambulation/Gait Ambulation/Gait assistance: Supervision Ambulation Distance (Feet): 300 Feet Assistive device: Rolling walker (2 wheeled) Gait Pattern/deviations: Step-through pattern;Ataxic Gait velocity: decreased   General Gait Details: VC for pursed lip breathing technique throughout ambulatory bout. Required 2 standing rest breaks to complete distance. SpO2 maintained 95% and greater on room air. HR up to 112. Pt with one instance of loss of balance but able to self correct (occured due to poor  right foot placement).   Stairs            Wheelchair Mobility    Modified Rankin (Stroke Patients Only)       Balance                                    Cognition Arousal/Alertness: Awake/alert Behavior During Therapy: WFL for tasks assessed/performed Overall Cognitive Status: Within Functional Limits for tasks assessed                      Exercises General Exercises - Lower Extremity Ankle Circles/Pumps: AROM;Both;Seated;20 reps Long Arc Quad: Strengthening;Both;20 reps;Seated Hip Flexion/Marching: Strengthening;Both;20 reps;Sidelying;Standing (x10 standing with RW) Mini-Sqauts: Strengthening;Both;10 reps;Standing    General Comments        Pertinent Vitals/Pain Pain Assessment: No/denies pain Pain Intervention(s): Monitored during session    Home Living                      Prior Function            PT Goals (current goals can now be found in the care plan section) Acute Rehab PT Goals PT Goal Formulation: With patient/family Time For Goal Achievement: 02/11/14 Potential to Achieve Goals: Good Progress towards PT goals: Progressing toward goals    Frequency  Min 5X/week    PT Plan Current plan remains appropriate    Co-evaluation             End of Session Equipment Utilized During Treatment: Gait belt Activity Tolerance: Patient tolerated treatment well Patient left: with call bell/phone within reach;in chair  Time: 1914-7829 PT Time Calculation (min) (ACUTE ONLY): 23 min  Charges:  $Gait Training: 8-22 mins $Therapeutic Exercise: 8-22 mins                    G Codes:      Ellouise Newer Feb 10, 2014, 4:55 PM Camille Bal [place], [place]

## 2014-01-29 NOTE — Progress Notes (Signed)
Name: Vanessa Middleton MRN: [id number] DOB: 1955-10-30    ADMISSION DATE:  01/27/2014 CONSULTATION DATE: 12/16  REFERRING MD :  [facility]  PCP - paz  CHIEF COMPLAINT:  DOE  BRIEF PATIENT DESCRIPTION:  Obese WF in mild resp distress  SIGNIFICANT EVENTS    STUDIES:  12/16 CT chest neg pe 12/16 2 d echo>>nml LV fn   HISTORY OF PRESENT ILLNESS:   [name], former smoker(quit 18 years ago), life long asthma (controlled on Symbicort, albuterol,prilosec(gerd )with a history of rheumatic fever as child who had  C7-T3 laminectomy for resection of schwannoma on 12/3 per Dr. Ronnald Ramp. She was progressing well till 12/13 at which time she woke up with rt facial pain, chest pain, heartburn and shortness of breath. She was seen by her PCP 12/4 treated with steroid shot. She continued to have worsening dyspnea on exertion with out fever , chills, sweats or productive sputum. She notes increased sob over last 24 hours along with one episode of left calf pain and SOB to the point of calling 911.  Note she has recently gained weight is on anxiolytics and now narcotics. Suspect aspiration by HPI and CHF may be a concern. CT angio negative for PE but shows bilateral upper lobes ground glass, CE negative.Marland Kitchen PCCM asked to evaluate.   SUBJECTIVE: afebrile C/o dyspnea improving Appears improved, cheerful afebrile  VITAL SIGNS: Temp:  [98 F (36.7 C)-98.4 F (36.9 C)] 98 F (36.7 C) (12/18 0500) Pulse Rate:  [83-98] 90 (12/18 0500) Resp:  [18-20] 20 (12/18 0500) BP: (125-171)/(60-80) 135/76 mmHg (12/18 0500) SpO2:  [95 %-98 %] 97 % (12/18 0926) Weight:  [102.331 kg (225 lb 9.6 oz)] 102.331 kg (225 lb 9.6 oz) (12/18 0500)  PHYSICAL EXAMINATION: General:  Obese MAWF in noresp distress, able to lie supine  Neuro:  Appears intact, MAE x 4 , some rt leg weakness HEENT: No JVN/LAN noted Cardiovascular:  hsr rrr 2/6 Murmur Lungs: no wheeze, Mild rhonchi at end expiration Abdomen:  Obese +  bs Musculoskeletal:  intact Skin:  No lower ext edema, t1 suture line healing. Some mobile fluid noted around wound   Recent Labs Lab 01/27/14 0810 01/28/14 0425 01/29/14 0355  NA 135* 140 141  K 3.2* 4.5 4.1  CL 93* 97 100  CO2 24 28 28   BUN 10 14 18   CREATININE 0.58 0.68 0.70  GLUCOSE 292* 146* 150*    Recent Labs Lab 01/27/14 0810 01/28/14 0425 01/29/14 0355  HGB 10.9* 10.1* 10.0*  HCT 34.3* 32.7* 31.9*  WBC 17.1* 22.3* 14.5*  PLT 389 434* 436*   Dg Chest 2 View  01/28/2014   CLINICAL DATA:  Cough.  EXAM: CHEST  2 VIEW  COMPARISON:  CT chest 01/29/2014. Chest x-ray 01/26/2014, 08/11/2010.  FINDINGS: Mediastinum and hilar structures normal. Heart size stable. Pulmonary vascularity normal. Stable very mild bilateral interstitial prominence, particularly in the upper lobes again noted. Findings again consistent with mild pneumonitis. Stable pulmonary nodule right mid lung, and unchanged from prior chest x-ray 08/11/2010. This consistent with benign pulmonary nodule. No pleural effusion or pneumothorax.  IMPRESSION: 1. Stable very mild interstitial infiltrates, particularly in the upper lobes, unchanged from prior CT of 01/27/2014 and consistent with very mild changes of pneumonitis. 2. Stable pulmonary nodule right mid lung, unchanged from prior chest x-ray 08/11/2010. This is consistent with a benign pulmonary nodule.   Electronically Signed   By: Marcello Moores  Register   On: 01/28/2014 07:57   Principal Problem:  Acute respiratory failure with hypoxia Active Problems:   Asthma   GERD   Anxiety and depression   Asthma exacerbation   Pneumonia, organism unspecified   Essential hypertension   Leucocytosis   Aspiration pneumonia   ASSESSMENT  Acute respiratory distress with bronchospasm -resolving  Bilateral upper lobe ground glass opacity on CT- Suspected aspiration  Asthma with exacerbation ? Underlying COPD (42 PPD ex- smoker)  Reflux worsened by narcotics and recent  spinal surgery  Chest Pain  with neg trop Hx of rheumatic as a child 12/3 c7- t3 tumor removal per NS  PLAN:  Bronchodilators as ordered Levaquin x 7 ds ok O2 as needed PO prednisone - taper over 2 weeks  BID PPI for gerd trigger Avoid narcotics if possible She needs Pulmonary F/U after discharge -made  [facility] V. MD   01/29/2014, 12:15 PM

## 2014-02-01 ENCOUNTER — Encounter: Payer: BC Managed Care – PPO | Admitting: Internal Medicine

## 2014-02-01 NOTE — Care Management Note (Signed)
    Page 1 of 1   02/01/2014     10:19:18 AM CARE MANAGEMENT NOTE 02/01/2014  Patient:  Vanessa Middleton, Vanessa Middleton   Account Number:  [id number]  Date Initiated:  02/01/2014  Documentation initiated by:  GRAVES-BIGELOW,Kisa Fujii  Subjective/Objective Assessment:   Pt admitted for Acute respiratory failure with hypoxia.     Action/Plan:   CM did call pt in regards to [facility] services and she stated she would go through her neurosurgeon for recommendations. No HH services set up at this time.   Anticipated DC Date:  01/29/2014   Anticipated DC Plan:  [place]  CM consult      Choice offered to / List presented to:             Status of service:   Medicare Important Message given?  NO (If response is "NO", the following Medicare IM given date fields will be blank) Date Medicare IM given:   Medicare IM given by:   Date Additional Medicare IM given:   Additional Medicare IM given by:    Discharge Disposition:  HOME/SELF CARE  Per UR Regulation:  Reviewed for med. necessity/level of care/duration of stay  If discussed at [place] of Stay Meetings, dates discussed:    Comments:

## 2014-02-02 ENCOUNTER — Telehealth: Payer: Self-pay | Admitting: Internal Medicine

## 2014-02-02 ENCOUNTER — Ambulatory Visit (INDEPENDENT_AMBULATORY_CARE_PROVIDER_SITE_OTHER): Payer: BC Managed Care – PPO | Admitting: Internal Medicine

## 2014-02-02 ENCOUNTER — Encounter: Payer: Self-pay | Admitting: Internal Medicine

## 2014-02-02 VITALS — BP 134/82 | HR 85 | Temp 98.2°F | Ht 64.0 in | Wt 211.1 lb

## 2014-02-02 DIAGNOSIS — J690 Pneumonitis due to inhalation of food and vomit: Secondary | ICD-10-CM

## 2014-02-02 DIAGNOSIS — B370 Candidal stomatitis: Secondary | ICD-10-CM

## 2014-02-02 DIAGNOSIS — J4541 Moderate persistent asthma with (acute) exacerbation: Secondary | ICD-10-CM

## 2014-02-02 DIAGNOSIS — D62 Acute posthemorrhagic anemia: Secondary | ICD-10-CM

## 2014-02-02 DIAGNOSIS — J189 Pneumonia, unspecified organism: Secondary | ICD-10-CM

## 2014-02-02 LAB — CULTURE, BLOOD (ROUTINE X 2)
CULTURE: NO GROWTH
Culture: NO GROWTH

## 2014-02-02 MED ORDER — NYSTATIN 100000 UNIT/ML MT SUSP: 5.0000 mL | Freq: Four times a day (QID) | OROMUCOSAL | Status: DC

## 2014-02-02 NOTE — Progress Notes (Signed)
Subjective:    Patient ID: Vanessa Middleton, female    DOB: 06/25/1955, 58 y.o.   MRN: [id number]  DOS:  02/02/2014 Type of visit - description : Hospital follow-up, chart is reviewed, excerpts Admit date: 01/27/2014 Discharge date: 01/29/2014 Recommendations for Outpatient Follow-up:  Refused home health resumption Needs pulm f/up Discharge Diagnoses:   Principal Problem:   Acute respiratory failure with hypoxia   Aspiration pneumonia  History of present illness:  Vanessa Middleton is a 58 y.o. 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  Patient presented to the ER because of worsening shortness of breath and CT angiogram of the chest was negative for PE but did show bilateral infiltrates concerning for pneumonitis versus atypical infection. Patient in the ER was found to be wheezing and diaphoretic.  Patient is requiring 4 L of oxygen and patient usually on no oxygen at home. Patient has been restarted on antibiotics and placed on Solu-Medrol and nebulizer and admitted for acute respiratory failure probably from asthma and possible pneumonia.   Hospital Course:  Acute hypoxic respiratory failure -   -Solu-Medrol - change to PO   -no need for home O2 -levaquin x 7 days -seen by pulm -Continue with nebulizer Pulmicort. -Check blood cultures   -echo ok Bilateral chest infiltrates - abx Recent thoracic surgery for schwannoma - was seen by patient's neurosurgeon yesterday, PT eval- patient refused at home Mild anemia probably postoperative blood loss - follow CBC periodically     ROS Here for a hospital follow-up, was admitted to the hospital with respiratory failure after a recent surgery. She is here with her sister, since she left the hospital she is feeling better. Appetite is coming back, no fever chills No chest pain, shortness of breath improving.  No lower extremity edema No nausea, vomiting, diarrhea, still has some GERD symptoms. Complaining of sore throat  Past Medical History  Diagnosis Date  . GERD (gastroesophageal reflux disease)     EGD 10/11  . Hypertension   . Fatty liver     per [place] 9/11  . Personal history of colonic polyps 11/23/2007  . Anxiety and depression   . Anxiety and depression 10-2011  . Complication of anesthesia   . PONV (postoperative nausea and vomiting)   . Shortness of breath dyspnea     /w exertion   . H/O pulmonary function tests ?2000    ordered by Dr. Larose Kells, pt. reports thwe results were wnl.   . Anxiety   . Depression   . Asthma     "breathing is a little tighter today"- 01/11/2014  . Pneumonia     Past Surgical History  Procedure Laterality Date  . Appendectomy  01/1997  . Cesarean section  03/1996  . Tonsillectomy    . Plantar fascitis      right foot  . Pilonidal cyst excision    . Colonoscopy    . Esophagogastroduodenoscopy    . Breast biopsy Right 07/2007    neg  . Laminectomy N/A 01/14/2014    Procedure: CERVICAL LAMINECTOMY FOR TUMOR. 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 Back surgery      History   Social History  . Marital Status: Divorced    Spouse Name: N/A    Number of  Children: 1   . Years of Education: N/A   Occupational History  . Customer Service [employer])    Social History Main Topics  . Smoking status: Former Smoker    Quit date: 02/13/1996  . Smokeless tobacco: Never Used     Comment: used to smoke 2 ppd  . Alcohol Use: 0.0 oz/week    0 Not specified per week     Comment: 2-3 x/week  . Drug Use: No  . Sexual Activity: Not on file   Other Topics Concern  . Not on file   Social History Narrative   Lives w/ son        Medication List       This list is accurate as of: 02/02/14 11:59 PM.  Always use your most recent med  list.               acetaminophen 500 MG tablet  Commonly known as:  TYLENOL  Take 500-1,000 mg by mouth every 6 (six) hours as needed for mild pain.     albuterol 108 (90 BASE) MCG/ACT inhaler  Commonly known as:  VENTOLIN HFA  INHALE 2 PUFFS BY MOUTH EVERY 6 HOURS AS NEEDED FOR COUGH OR WHEEZING     ALPRAZolam 0.5 MG tablet  Commonly known as:  XANAX  TAKE 1 TABLET BY MOUTH THREE TIMES DAILY AS NEEDED     cholecalciferol 1000 UNITS tablet  Commonly known as:  VITAMIN D  Take 2,000 Units by mouth daily.     guaiFENesin 600 MG 12 hr tablet  Commonly known as:  MUCINEX  Take 1 tablet (600 mg total) by mouth 2 (two) times daily.     hydrochlorothiazide 25 MG tablet  Commonly known as:  HYDRODIURIL  TAKE 1 TABLET BY MOUTH DAILY     levofloxacin 500 MG tablet  Commonly known as:  LEVAQUIN  Take 1 tablet (500 mg total) by mouth daily.     levonorgestrel 20 MCG/24HR IUD  Commonly known as:  MIRENA  1 each by Intrauterine route once.     Melatonin 5 MG Tabs  Take 5 mg by mouth daily as needed (sleep).     montelukast 10 MG tablet  Commonly known as:  SINGULAIR  TAKE 1 TABLET BY MOUTH EVERY NIGHT AT BEDTIME     mupirocin ointment 2 %  Commonly known as:  BACTROBAN     nystatin 100000 UNIT/ML suspension  Commonly known as:  MYCOSTATIN  Take 5 mLs (500,000 Units total) by mouth 4 (four) times daily.     omeprazole 40 MG capsule  Commonly known as:  PRILOSEC  TAKE ONE CAPSULE BY MOUTH EVERY MORNING     oxyCODONE-acetaminophen 5-325 MG per tablet  Commonly known as:  PERCOCET/ROXICET  Take 1-2 tablets by mouth every 4 (four) hours as needed for moderate pain.     predniSONE 10 MG tablet  Commonly known as:  DELTASONE  40 mg PO x 3 days, 30 mg x3 days, 20 mg x 3 days, 10 mg x 3 days then d/c     ranitidine 300 MG tablet  Commonly known as:  ZANTAC  TAKE 1 TABLET BY MOUTH EVERY NIGHT AT BEDTIME     sertraline 100 MG tablet  Commonly known as:  ZOLOFT  Take 150  mg by mouth daily before breakfast.     SYMBICORT 160-4.5 MCG/ACT inhaler  Generic drug:  budesonide-formoterol  INHALE 2 PUFFS BY MOUTH TWICE DAILY     tiZANidine 4  MG capsule  Commonly known as:  ZANAFLEX  Take 4 mg by mouth 3 (three) times daily as needed for muscle spasms.           Objective:   Physical Exam BP 134/82 mmHg  Pulse 85  Temp(Src) 98.2 F (36.8 C) (Oral)  Ht 5\' 4"  (1.626 m)  Wt 211 lb 2 oz (95.766 kg)  BMI 36.22 kg/m2  SpO2 95%  LMP 05/01/2010 General -- alert, well-developed, NAD.  Neck --no thyromegaly , normal carotid pulse, no LAD HEENT--  Throat symmetric, mild redness and the soft plate, covered w/ white-cotton like  discharge Lungs -- normal respiratory effort, no intercostal retractions, no accessory muscle use, increased expiratory time and mild wheezing bilaterally.  Heart-- normal rate, regular rhythm, no murmur.  Extremities-- no pretibial edema bilaterally   Psych-- Cognition and judgment appear intact. Cooperative with normal attention span and concentration. No anxious or depressed appearing.     Assessment & Plan:   Thrush, treat with nystatin  Mild anemia, likely postoperative, recheck on return to the office

## 2014-02-02 NOTE — Patient Instructions (Signed)
Continue with prednisone as prescribed Finished the antibiotic  Nystatin: Gargle, swish and spit   4 times a day for the next 5 days  Get a chest x-ray 02/10/2014 in preparation for the visit at the pulmonary clinic the next day  Call or come back anytime if you have have fever, chills, increased respiratory symptoms  Next routine visit with me 2-3  months

## 2014-02-02 NOTE — Progress Notes (Signed)
Pre visit review using our clinic review tool, if applicable. No additional management support is needed unless otherwise documented below in the visit note. 

## 2014-02-02 NOTE — Telephone Encounter (Signed)
Hospital follow up appointment scheduled for 02/02/14 @ 1:15 with Dr. Larose Kells.

## 2014-02-02 NOTE — Telephone Encounter (Signed)
Needs hospital f/u, please arrange

## 2014-02-03 NOTE — Assessment & Plan Note (Signed)
Pneumonia, Afebrile, finish antibiotics, follow-up with pulmonary, will get a chest x-ray before the follow-up

## 2014-02-03 NOTE — Assessment & Plan Note (Signed)
Asthma, seems to be gradually improving, continue with prednisone and inhalers.

## 2014-02-09 ENCOUNTER — Other Ambulatory Visit: Payer: Self-pay | Admitting: Internal Medicine

## 2014-02-10 ENCOUNTER — Ambulatory Visit (HOSPITAL_BASED_OUTPATIENT_CLINIC_OR_DEPARTMENT_OTHER)
Admission: RE | Admit: 2014-02-10 | Discharge: 2014-02-10 | Disposition: A | Discharge status: Home / Self Care | Payer: BC Managed Care – PPO | Source: Ambulatory Visit | Attending: Internal Medicine | Admitting: Internal Medicine

## 2014-02-10 DIAGNOSIS — J690 Pneumonitis due to inhalation of food and vomit: Secondary | ICD-10-CM

## 2014-02-10 DIAGNOSIS — R05 Cough: Secondary | ICD-10-CM | POA: Insufficient documentation

## 2014-02-10 DIAGNOSIS — R0989 Other specified symptoms and signs involving the circulatory and respiratory systems: Secondary | ICD-10-CM | POA: Insufficient documentation

## 2014-02-11 ENCOUNTER — Encounter: Payer: Self-pay | Admitting: Adult Health

## 2014-02-11 ENCOUNTER — Ambulatory Visit (INDEPENDENT_AMBULATORY_CARE_PROVIDER_SITE_OTHER): Payer: BC Managed Care – PPO | Admitting: Adult Health

## 2014-02-11 VITALS — BP 126/84 | HR 80 | Temp 98.1°F | Ht 63.0 in | Wt 215.2 lb

## 2014-02-11 DIAGNOSIS — J690 Pneumonitis due to inhalation of food and vomit: Secondary | ICD-10-CM

## 2014-02-11 DIAGNOSIS — J45901 Unspecified asthma with (acute) exacerbation: Secondary | ICD-10-CM

## 2014-02-11 NOTE — Assessment & Plan Note (Signed)
Suspected Aspiration PNA now resolved with abx  cxr improved w/ clearance  Plan  No further abx  follow up with pulmonary As needed   Cont PCP follow up .

## 2014-02-11 NOTE — Progress Notes (Signed)
   Subjective:    Patient ID: Vanessa Middleton, female    DOB: 1955/11/14, 58 y.o.   MRN: [id number]  HPI 58 yo female seen for pulmonary consult for aspiration  pneumonia 01/27/14 during hospital stay.   02/11/2014 [facility] follow up   Patient returns for a post hospital  Follow-up appointment  She was admitted December 16 through December 18 for acute respiratory failure with hypoxemia secondary to suspected aspiration pneumonia and asthma exacerbation  prior to admission. Patient had a recent back surgery for schwannoma removal .  CT chest showed bilateral infiltrates concerning for pneumonitis versus atypical infection. It was negative for pulmonary embolism. Patient was treated with aggressive IV antibiotics steroids, and nebulized bronchodilators.  She was discharged on Levaquin and a prednisone taper she is finished both of these prescriptions  Patient states that she is feeling much improved with decreased cough and congestion.  Feels that her breathing is near normal.  Chest x-ray done yesterday showed no acute infiltrate.  Showed a stable nodule along the right lung base since 2012.  She denies any chest pain, orthopnea, PND or leg swelling    Review of Systems Constitutional:   No  weight loss, night sweats,  Fevers, chills, fatigue, or  lassitude.  HEENT:   No headaches,  Difficulty swallowing,  Tooth/dental problems, or  Sore throat,                No sneezing, itching, ear ache, nasal congestion, post nasal drip,   CV:  No chest pain,  Orthopnea, PND, swelling in lower extremities, anasarca, dizziness, palpitations, syncope.   GI  No heartburn, indigestion, abdominal pain, nausea, vomiting, diarrhea, change in bowel habits, loss of appetite, bloody stools.   Resp: No shortness of breath with exertion or at rest.  No excess mucus, no productive cough,  No non-productive cough,  No coughing up of blood.  No change in color of mucus.  No wheezing.  No chest wall  deformity  Skin: no rash or lesions.  GU: no dysuria, change in color of urine, no urgency or frequency.  No flank pain, no hematuria   MS:  + back pain.  Psych:  No change in mood or affect. No depression or anxiety.  No memory loss.         Objective:   Physical Exam GEN: A/Ox3; pleasant , NAD, well nourished , walks with walker   HEENT:  [place]/AT,  EACs-clear, TMs-wnl, NOSE-clear, THROAT-clear, no lesions, no postnasal drip or exudate noted.   NECK:  Supple w/ fair ROM; no JVD; normal carotid impulses w/o bruits; no thyromegaly or nodules palpated; no lymphadenopathy.  RESP  Clear  P & A; w/o, wheezes/ rales/ or rhonchi.no accessory muscle use, no dullness to percussion  CARD:  RRR, no m/r/g  , no peripheral edema, pulses intact, no cyanosis or clubbing.  GI:   Soft & nt; nml bowel sounds; no organomegaly or masses detected.  Musco: Warm bil, no deformities or joint swelling noted Abn gait   Neuro: alert, no focal deficits noted.    Skin: Warm, no lesions or rashes         Assessment & Plan:

## 2014-02-11 NOTE — Assessment & Plan Note (Signed)
Recent flare now resolved  Cont on current regimen  follow up with PCP for management

## 2014-02-11 NOTE — Progress Notes (Signed)
Hospital and office notes/ exam/labs and xrays reviewed and agree with a/p

## 2014-02-11 NOTE — Patient Instructions (Signed)
Continue on current regimen  Follow up with our office as needed.

## 2014-02-18 ENCOUNTER — Ambulatory Visit: Payer: BLUE CROSS/BLUE SHIELD | Attending: Neurological Surgery | Admitting: Physical Therapy

## 2014-02-18 DIAGNOSIS — R260 Ataxic gait: Secondary | ICD-10-CM | POA: Diagnosis not present

## 2014-02-18 DIAGNOSIS — G9529 Other cord compression: Secondary | ICD-10-CM | POA: Insufficient documentation

## 2014-02-18 DIAGNOSIS — R262 Difficulty in walking, not elsewhere classified: Secondary | ICD-10-CM | POA: Insufficient documentation

## 2014-02-18 DIAGNOSIS — Z4889 Encounter for other specified surgical aftercare: Secondary | ICD-10-CM | POA: Diagnosis not present

## 2014-02-22 ENCOUNTER — Ambulatory Visit: Payer: BLUE CROSS/BLUE SHIELD | Admitting: Physical Therapy

## 2014-02-22 DIAGNOSIS — Z4889 Encounter for other specified surgical aftercare: Secondary | ICD-10-CM | POA: Diagnosis not present

## 2014-02-25 ENCOUNTER — Ambulatory Visit: Payer: BLUE CROSS/BLUE SHIELD | Admitting: Physical Therapy

## 2014-02-25 DIAGNOSIS — Z4889 Encounter for other specified surgical aftercare: Secondary | ICD-10-CM | POA: Diagnosis not present

## 2014-03-01 ENCOUNTER — Ambulatory Visit: Payer: BLUE CROSS/BLUE SHIELD | Admitting: Physical Therapy

## 2014-03-01 DIAGNOSIS — Z4889 Encounter for other specified surgical aftercare: Secondary | ICD-10-CM | POA: Diagnosis not present

## 2014-03-04 ENCOUNTER — Ambulatory Visit: Payer: BLUE CROSS/BLUE SHIELD | Admitting: Physical Therapy

## 2014-03-04 DIAGNOSIS — Z4889 Encounter for other specified surgical aftercare: Secondary | ICD-10-CM | POA: Diagnosis not present

## 2014-03-09 ENCOUNTER — Ambulatory Visit: Payer: BLUE CROSS/BLUE SHIELD | Admitting: Physical Therapy

## 2014-03-09 ENCOUNTER — Other Ambulatory Visit: Payer: Self-pay

## 2014-03-09 DIAGNOSIS — Z1231 Encounter for screening mammogram for malignant neoplasm of breast: Secondary | ICD-10-CM

## 2014-03-09 DIAGNOSIS — Z4889 Encounter for other specified surgical aftercare: Secondary | ICD-10-CM | POA: Diagnosis not present

## 2014-03-11 ENCOUNTER — Ambulatory Visit: Payer: BLUE CROSS/BLUE SHIELD | Admitting: Physical Therapy

## 2014-03-12 ENCOUNTER — Ambulatory Visit
Admission: RE | Admit: 2014-03-12 | Discharge: 2014-03-12 | Disposition: A | Discharge status: Home / Self Care | Payer: BLUE CROSS/BLUE SHIELD | Source: Ambulatory Visit

## 2014-03-12 DIAGNOSIS — Z1231 Encounter for screening mammogram for malignant neoplasm of breast: Secondary | ICD-10-CM

## 2014-03-15 ENCOUNTER — Ambulatory Visit: Payer: BLUE CROSS/BLUE SHIELD | Attending: Neurological Surgery | Admitting: Physical Therapy

## 2014-03-15 DIAGNOSIS — R260 Ataxic gait: Secondary | ICD-10-CM | POA: Insufficient documentation

## 2014-03-15 DIAGNOSIS — R262 Difficulty in walking, not elsewhere classified: Secondary | ICD-10-CM | POA: Insufficient documentation

## 2014-03-15 DIAGNOSIS — G9529 Other cord compression: Secondary | ICD-10-CM | POA: Diagnosis not present

## 2014-03-15 DIAGNOSIS — Z4889 Encounter for other specified surgical aftercare: Secondary | ICD-10-CM | POA: Insufficient documentation

## 2014-03-18 ENCOUNTER — Ambulatory Visit: Payer: BLUE CROSS/BLUE SHIELD | Admitting: Physical Therapy

## 2014-03-18 DIAGNOSIS — Z4889 Encounter for other specified surgical aftercare: Secondary | ICD-10-CM | POA: Diagnosis not present

## 2014-03-22 ENCOUNTER — Ambulatory Visit: Payer: BLUE CROSS/BLUE SHIELD | Admitting: Physical Therapy

## 2014-03-22 DIAGNOSIS — Z4889 Encounter for other specified surgical aftercare: Secondary | ICD-10-CM | POA: Diagnosis not present

## 2014-03-23 ENCOUNTER — Other Ambulatory Visit: Payer: Self-pay | Admitting: Internal Medicine

## 2014-03-24 ENCOUNTER — Ambulatory Visit: Payer: BLUE CROSS/BLUE SHIELD | Admitting: Physical Therapy

## 2014-03-24 DIAGNOSIS — Z4889 Encounter for other specified surgical aftercare: Secondary | ICD-10-CM | POA: Diagnosis not present

## 2014-03-31 ENCOUNTER — Encounter: Payer: Self-pay | Admitting: Physical Therapy

## 2014-03-31 ENCOUNTER — Ambulatory Visit: Payer: BLUE CROSS/BLUE SHIELD | Admitting: Physical Therapy

## 2014-03-31 DIAGNOSIS — Z4889 Encounter for other specified surgical aftercare: Secondary | ICD-10-CM | POA: Diagnosis not present

## 2014-03-31 DIAGNOSIS — R269 Unspecified abnormalities of gait and mobility: Secondary | ICD-10-CM

## 2014-03-31 NOTE — Therapy (Signed)
[facility] [place], [place], [address] Phone: [phone]   Fax:  [phone]  Physical Therapy Treatment  Patient Details  Name: Vanessa Middleton MRN: [id number] Date of Birth: 05-29-1955 Referring Provider:  Eustace Moore, MD  Encounter Date: 03/31/2014      PT End of Session - 03/31/14 0938    Visit Number 12   PT Start Time 0920   Activity Tolerance Patient limited by fatigue      Past Medical History  Diagnosis Date  . GERD (gastroesophageal reflux disease)     EGD 10/11  . Hypertension   . Fatty liver     per [place] 9/11  . Personal history of colonic polyps 11/23/2007  . Anxiety and depression   . Anxiety and depression 10-2011  . Complication of anesthesia   . PONV (postoperative nausea and vomiting)   . Shortness of breath dyspnea     /w exertion   . H/O pulmonary function tests ?2000    ordered by Dr. Larose Kells, pt. reports thwe results were wnl.   . Anxiety   . Depression   . Asthma     "breathing is a little tighter today"- 01/11/2014  . Pneumonia     Past Surgical History  Procedure Laterality Date  . Appendectomy  01/1997  . Cesarean section  03/1996  . Tonsillectomy    . Plantar fascitis      right foot  . Pilonidal cyst excision    . Colonoscopy    . Esophagogastroduodenoscopy    . Breast biopsy Right 07/2007    neg  . Laminectomy N/A 01/14/2014    Procedure: CERVICAL LAMINECTOMY FOR TUMOR. CERVICAL SEVEN TO THORACIC THREE FOR INTRADURAL TUMOR;  Surgeon: Eustace Moore, MD;  Location: [facility];  Service: Neurosurgery;  Laterality: N/A;  . Back surgery      LMP 05/01/2010  Visit Diagnosis:  Abnormality of gait      Subjective Assessment - 03/31/14 0937    Symptoms Ataxic gait and very poor conditioning   Limitations Standing;Walking;House hold activities   How long can you stand comfortably? 10 minutes   How long can you walk comfortably? 5 minutes   Patient Stated  Goals return to work and the ability to do things around the house without difficulty   Currently in Pain? No/denies      Treatment:  Gait around building  Total of 10 minutes had to have 2 episodes of CGA due to LOB, had to have 3 rests due to SOB.   Elliptical 5 minutes with one rest, leg press 30# 3x10, calf press 30# 2x15 Leg curls 35# 2x15, leg extension 10# 2x15 Balance activities:  Ball toss, airex, ball kicks directional walking, heel and toe walking Bike Level 1 x 5 minutes Instruction to get up and move every 30 minutes while working at home                           PT [place] - 03/31/14 0942    PT LONG TERM GOAL #1   Title independent with advanced HEP including balance   Time 6   Period Weeks   Status New   PT LONG TERM GOAL #2   Title Increase ROM 25% of the cervical spine   Time 6   Period Weeks   Status New   PT LONG TERM GOAL #3   Title  increase Berg balance score to 46/56   Time 6   Period Weeks   Status New               Plan - 03/31/14 5830    Clinical Impression Statement Patient returned to work from home this week and reports physical and mental fatigue.  Has difficulty walking > 5 minutes, some balance deficits   Pt will benefit from skilled therapeutic intervention in order to improve on the following deficits Abnormal gait;Decreased activity tolerance;Decreased balance;Decreased mobility;Decreased endurance;Decreased coordination;Difficulty walking   Rehab Potential Good   PT Frequency 1x / week   PT Duration 4 weeks   PT Treatment/Interventions Gait training;Therapeutic exercise;Balance training;Neuromuscular re-education;Patient/family education   PT Next Visit Plan continue to focus on strength and endurance as wwell as balance   Consulted and Agree with Plan of Care Patient        Problem List Patient Active Problem List   Diagnosis Date Noted  . Status asthmaticus 01/27/2014  . Acute respiratory  failure with hypoxia 01/27/2014  . Asthma exacerbation 01/27/2014  . Pneumonia, organism unspecified 01/27/2014  . Essential hypertension 01/27/2014  . Leucocytosis 01/27/2014  . Aspiration pneumonia 01/27/2014  . 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 Dizziness 11/11/2013  . Lichenoid keratosis 94/08/6806  . Dermatitis 12/09/2012  . Anxiety and depression 10/29/2011  . Annual physical exam 11/14/2010  . OBESITY 11/15/2009  . OTHER DYSPHAGIA 11/15/2009  . ESSENTIAL HYPERTENSION 11/04/2009  . GERD 12/07/2008  . Prediabetes 12/07/2008  . ABNORMAL CHEST XRAY 12/07/2008  . Personal history of colonic adenomas 11/23/2007  . Asthma 06/09/2007  . KNEE PAIN, LEFT 11/14/2006    Sumner Boast, PT 03/31/2014, 9:44 AM  [facility] [address] [place], [place], [address] Phone: [phone]   Fax:  [phone]

## 2014-04-07 ENCOUNTER — Ambulatory Visit: Payer: BLUE CROSS/BLUE SHIELD | Admitting: Physical Therapy

## 2014-04-07 ENCOUNTER — Encounter: Payer: Self-pay | Admitting: Physical Therapy

## 2014-04-07 DIAGNOSIS — Z4889 Encounter for other specified surgical aftercare: Secondary | ICD-10-CM | POA: Diagnosis not present

## 2014-04-07 DIAGNOSIS — R269 Unspecified abnormalities of gait and mobility: Secondary | ICD-10-CM

## 2014-04-07 NOTE — Therapy (Signed)
[facility] [place], [place], [address] Phone: [phone]   Fax:  [phone]  Physical Therapy Treatment  Patient Details  Name: Vanessa Middleton MRN: [id number] Date of Birth: October 20, 1955 Referring Provider:  Eustace Moore, MD  Encounter Date: 04/07/2014      PT End of Session - 04/07/14 1706    Visit Number 13   PT Start Time 3299   PT Stop Time 1710   PT Time Calculation (min) 54 min   Activity Tolerance Patient limited by fatigue      Past Medical History  Diagnosis Date  . GERD (gastroesophageal reflux disease)     EGD 10/11  . Hypertension   . Fatty liver     per [place] 9/11  . Personal history of colonic polyps 11/23/2007  . Anxiety and depression   . Anxiety and depression 10-2011  . Complication of anesthesia   . PONV (postoperative nausea and vomiting)   . Shortness of breath dyspnea     /w exertion   . H/O pulmonary function tests ?2000    ordered by Dr. Larose Kells, pt. reports thwe results were wnl.   . Anxiety   . Depression   . Asthma     "breathing is a little tighter today"- 01/11/2014  . Pneumonia     Past Surgical History  Procedure Laterality Date  . Appendectomy  01/1997  . Cesarean section  03/1996  . Tonsillectomy    . Plantar fascitis      right foot  . Pilonidal cyst excision    . Colonoscopy    . Esophagogastroduodenoscopy    . Breast biopsy Right 07/2007    neg  . Laminectomy N/A 01/14/2014    Procedure: CERVICAL LAMINECTOMY FOR TUMOR. CERVICAL SEVEN TO THORACIC THREE FOR INTRADURAL TUMOR;  Surgeon: Eustace Moore, MD;  Location: [facility];  Service: Neurosurgery;  Laterality: N/A;  . Back surgery      LMP 05/01/2010  Visit Diagnosis:  Abnormality of gait      Subjective Assessment - 04/07/14 1633    Symptoms Ataxic gait and very poor conditioning, but overall improving   Limitations Standing;Walking;House hold activities   How long can you stand  comfortably? 10 minutes   How long can you walk comfortably? 5 minutes   Patient Stated Goals return to work and the ability to do things around the house without difficulty   Currently in Pain? No/denies                    [facility] Adult PT Treatment/Exercise - 04/07/14 0001    High Level Balance   High Level Balance Activities Side stepping;Braiding;Backward walking;Direction changes;Turns;Sudden stops;Head turns;Tandem walking   High Level Balance Comments instructed in HEP and safety for high level balance   Lumbar Exercises: Aerobic   Stationary Bike NuStep L5 3 minutes   Elliptical Level 6 6 minutes   UBE (Upper Arm Bike) level 5 4 minutes                     PT Long Term Goals - 04/07/14 1708    PT LONG TERM GOAL #2   Status Achieved   PT LONG TERM GOAL #3   Status Achieved               Plan - 04/07/14 1706    Clinical Impression Statement Doing very well, still fatigues and loses balance at times,  especially when sitting for long periods   Pt will benefit from skilled therapeutic intervention in order to improve on the following deficits Abnormal gait;Decreased activity tolerance;Decreased balance;Decreased mobility;Decreased endurance;Decreased coordination;Difficulty walking   Rehab Potential Good   PT Frequency 1x / week   PT Duration 2 weeks   PT Treatment/Interventions Gait training;Therapeutic exercise;Balance training;Neuromuscular re-education;Patient/family education   PT Next Visit Plan will hold treatement and have her try on own, biggest obstacle is her ability to be safe and to come in, she has a window of time to recover from nerve damage   Consulted and Agree with Plan of Care Patient        Problem List Patient Active Problem List   Diagnosis Date Noted  . Status asthmaticus 01/27/2014  . Acute respiratory failure with hypoxia 01/27/2014  . Asthma exacerbation 01/27/2014  . Pneumonia, organism unspecified 01/27/2014   . Essential hypertension 01/27/2014  . Leucocytosis 01/27/2014  . Aspiration pneumonia 01/27/2014  . 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 Dizziness 11/11/2013  . Lichenoid keratosis 30/06/1100  . Dermatitis 12/09/2012  . Anxiety and depression 10/29/2011  . Annual physical exam 11/14/2010  . OBESITY 11/15/2009  . OTHER DYSPHAGIA 11/15/2009  . ESSENTIAL HYPERTENSION 11/04/2009  . GERD 12/07/2008  . Prediabetes 12/07/2008  . ABNORMAL CHEST XRAY 12/07/2008  . Personal history of colonic adenomas 11/23/2007  . Asthma 06/09/2007  . KNEE PAIN, LEFT 11/14/2006    Sumner Boast, PT 04/07/2014, 5:09 PM  [facility] [address] [place], [place], [address] Phone: [phone]   Fax:  [phone]

## 2014-04-24 ENCOUNTER — Emergency Department (HOSPITAL_BASED_OUTPATIENT_CLINIC_OR_DEPARTMENT_OTHER): Payer: BLUE CROSS/BLUE SHIELD

## 2014-04-24 ENCOUNTER — Emergency Department (HOSPITAL_BASED_OUTPATIENT_CLINIC_OR_DEPARTMENT_OTHER)
Admission: EM | Admit: 2014-04-24 | Discharge: 2014-04-24 | Disposition: A | Discharge status: Home / Self Care | Payer: BLUE CROSS/BLUE SHIELD | Attending: Emergency Medicine | Admitting: Emergency Medicine

## 2014-04-24 ENCOUNTER — Encounter (HOSPITAL_BASED_OUTPATIENT_CLINIC_OR_DEPARTMENT_OTHER): Payer: Self-pay

## 2014-04-24 DIAGNOSIS — I10 Essential (primary) hypertension: Secondary | ICD-10-CM | POA: Diagnosis not present

## 2014-04-24 DIAGNOSIS — Z8701 Personal history of pneumonia (recurrent): Secondary | ICD-10-CM | POA: Diagnosis not present

## 2014-04-24 DIAGNOSIS — J45909 Unspecified asthma, uncomplicated: Secondary | ICD-10-CM | POA: Insufficient documentation

## 2014-04-24 DIAGNOSIS — Z86010 Personal history of colonic polyps: Secondary | ICD-10-CM | POA: Diagnosis not present

## 2014-04-24 DIAGNOSIS — F419 Anxiety disorder, unspecified: Secondary | ICD-10-CM | POA: Diagnosis not present

## 2014-04-24 DIAGNOSIS — K219 Gastro-esophageal reflux disease without esophagitis: Secondary | ICD-10-CM | POA: Diagnosis not present

## 2014-04-24 DIAGNOSIS — Y9289 Other specified places as the place of occurrence of the external cause: Secondary | ICD-10-CM | POA: Diagnosis not present

## 2014-04-24 DIAGNOSIS — Y9389 Activity, other specified: Secondary | ICD-10-CM | POA: Insufficient documentation

## 2014-04-24 DIAGNOSIS — W230XXA Caught, crushed, jammed, or pinched between moving objects, initial encounter: Secondary | ICD-10-CM | POA: Insufficient documentation

## 2014-04-24 DIAGNOSIS — Z79899 Other long term (current) drug therapy: Secondary | ICD-10-CM | POA: Insufficient documentation

## 2014-04-24 DIAGNOSIS — S96022A Laceration of muscle and tendon of long flexor muscle of toe at ankle and foot level, left foot, initial encounter: Secondary | ICD-10-CM | POA: Diagnosis not present

## 2014-04-24 DIAGNOSIS — S91119A Laceration without foreign body of unspecified toe without damage to nail, initial encounter: Secondary | ICD-10-CM

## 2014-04-24 DIAGNOSIS — Y998 Other external cause status: Secondary | ICD-10-CM | POA: Insufficient documentation

## 2014-04-24 DIAGNOSIS — F329 Major depressive disorder, single episode, unspecified: Secondary | ICD-10-CM | POA: Insufficient documentation

## 2014-04-24 DIAGNOSIS — Z87891 Personal history of nicotine dependence: Secondary | ICD-10-CM | POA: Insufficient documentation

## 2014-04-24 MED ORDER — LIDOCAINE HCL 2 % IJ SOLN
10.0000 mL | Freq: Once | INTRAMUSCULAR | Status: AC
  Administered 2014-04-24: 10 mL via INTRADERMAL
  Filled 2014-04-24: qty 20

## 2014-04-24 MED ORDER — CIPROFLOXACIN HCL 500 MG PO TABS: 500.0000 mg | ORAL_TABLET | Freq: Two times a day (BID) | ORAL | Status: DC

## 2014-04-24 MED ORDER — CEPHALEXIN 500 MG PO CAPS: 500.0000 mg | ORAL_CAPSULE | Freq: Two times a day (BID) | ORAL | Status: DC

## 2014-04-24 NOTE — ED Notes (Signed)
I completed wound care after cleaning dried blood from foot and toes. Per PA., I used bacitracin, then cotton padding in crease, then kerlix wrap and hospital sock. I advised patient on wound care needed before and after sutures are removed. I sized and applied post op shoe. I gave patient additional wound care supplies for home.  Patient ready for discharge.

## 2014-04-24 NOTE — Discharge Instructions (Signed)
Keep the cut clean and dry. Apply bacitracin twice a day. Wash with soap and water. Keflex as prescribed to prevent infection. Follow up with dr. Ninfa Linden in the office as referred next week. Return if worsening symptoms.   Laceration Care, Adult A laceration is a cut or lesion that goes through all layers of the skin and into the tissue just beneath the skin. TREATMENT  Some lacerations may not require closure. Some lacerations may not be able to be closed due to an increased risk of infection. It is important to see your caregiver as soon as possible after an injury to minimize the risk of infection and maximize the opportunity for successful closure. If closure is appropriate, pain medicines may be given, if needed. The wound will be cleaned to help prevent infection. Your caregiver will use stitches (sutures), staples, wound glue (adhesive), or skin adhesive strips to repair the laceration. These tools bring the skin edges together to allow for faster healing and a better cosmetic outcome. However, all wounds will heal with a scar. Once the wound has healed, scarring can be minimized by covering the wound with sunscreen during the day for 1 full year. HOME CARE INSTRUCTIONS  For sutures or staples:  Keep the wound clean and dry.  If you were given a bandage (dressing), you should change it at least once a day. Also, change the dressing if it becomes wet or dirty, or as directed by your caregiver.  Wash the wound with soap and water 2 times a day. Rinse the wound off with water to remove all soap. Pat the wound dry with a clean towel.  After cleaning, apply a thin layer of the antibiotic ointment as recommended by your caregiver. This will help prevent infection and keep the dressing from sticking.  You may shower as usual after the first 24 hours. Do not soak the wound in water until the sutures are removed.  Only take over-the-counter or prescription medicines for pain, discomfort, or fever  as directed by your caregiver.  Get your sutures or staples removed as directed by your caregiver. For skin adhesive strips:  Keep the wound clean and dry.  Do not get the skin adhesive strips wet. You may bathe carefully, using caution to keep the wound dry.  If the wound gets wet, pat it dry with a clean towel.  Skin adhesive strips will fall off on their own. You may trim the strips as the wound heals. Do not remove skin adhesive strips that are still stuck to the wound. They will fall off in time. For wound adhesive:  You may briefly wet your wound in the shower or bath. Do not soak or scrub the wound. Do not swim. Avoid periods of heavy perspiration until the skin adhesive has fallen off on its own. After showering or bathing, gently pat the wound dry with a clean towel.  Do not apply liquid medicine, cream medicine, or ointment medicine to your wound while the skin adhesive is in place. This may loosen the film before your wound is healed.  If a dressing is placed over the wound, be careful not to apply tape directly over the skin adhesive. This may cause the adhesive to be pulled off before the wound is healed.  Avoid prolonged exposure to sunlight or tanning lamps while the skin adhesive is in place. Exposure to ultraviolet light in the first year will darken the scar.  The skin adhesive will usually remain in place for 5 to  10 days, then naturally fall off the skin. Do not pick at the adhesive film. You may need a tetanus shot if:  You cannot remember when you had your last tetanus shot.  You have never had a tetanus shot. If you get a tetanus shot, your arm may swell, get red, and feel warm to the touch. This is common and not a problem. If you need a tetanus shot and you choose not to have one, there is a rare chance of getting tetanus. Sickness from tetanus can be serious. SEEK MEDICAL CARE IF:   You have redness, swelling, or increasing pain in the wound.  You see a red  line that goes away from the wound.  You have yellowish-white fluid (pus) coming from the wound.  You have a fever.  You notice a bad smell coming from the wound or dressing.  Your wound breaks open before or after sutures have been removed.  You notice something coming out of the wound such as wood or glass.  Your wound is on your hand or foot and you cannot move a finger or toe. SEEK IMMEDIATE MEDICAL CARE IF:   Your pain is not controlled with prescribed medicine.  You have severe swelling around the wound causing pain and numbness or a change in color in your arm, hand, leg, or foot.  Your wound splits open and starts bleeding.  You have worsening numbness, weakness, or loss of function of any joint around or beyond the wound.  You develop painful lumps near the wound or on the skin anywhere on your body. MAKE SURE YOU:   Understand these instructions.  Will watch your condition.  Will get help right away if you are not doing well or get worse. Document Released: 01/29/2005 Document Revised: 04/23/2011 Document Reviewed: 07/25/2010 [facility] Patient Information 2015 [place], [place]. This information is not intended to replace advice given to you by your health care provider. Make sure you discuss any questions you have with your health care provider.

## 2014-04-24 NOTE — ED Notes (Signed)
PA-C Tatyana Kirichenko wanted a foot bath of sterile water and SAF-Clens AF for patient's left foot due to a laceration on her pinky toe.

## 2014-04-24 NOTE — ED Notes (Addendum)
Pt reports last night had a fall, R pinkie toe got caught on object, sustained laceration to posterior pinkie toe.  Pt refuses need for xray at this time.

## 2014-04-24 NOTE — ED Notes (Signed)
Patient has laceration in the bend of her left pinky toe, Bleeding controlled, washed PTA with soap and water. Patient current on tetanus vaccine.

## 2014-04-24 NOTE — ED Provider Notes (Signed)
CSN: [id number]     Arrival date & time 04/24/14  1355 History   First MD Initiated Contact with Patient 04/24/14 1646     Chief Complaint  Patient presents with  . Extremity Laceration     (Consider location/radiation/quality/duration/timing/severity/associated sxs/prior Treatment) HPI Vanessa Middleton is a 59 y.o. female presents to emergency department with a laceration to the left toe. Patient states she fell last night and her toe hyperextended. She states she had a laceration to the bottom of the toe. States it bled a large amount, states she cleaned it and apply dressing to it. This morning she washed it again with soap and water. This evening she decided to come in and get it evaluated. She denies any other injuries during the fall. She denies any numbness to the toe distally. She states her tetanus is up-to-date. Pain is worsened with walking and palpation of the toe. Nothing makes it better.  Past Medical History  Diagnosis Date  . GERD (gastroesophageal reflux disease)     EGD 10/11  . Hypertension   . Fatty liver     per [place] 9/11  . Personal history of colonic polyps 11/23/2007  . Anxiety and depression   . Anxiety and depression 10-2011  . Complication of anesthesia   . PONV (postoperative nausea and vomiting)   . Shortness of breath dyspnea     /w exertion   . H/O pulmonary function tests ?2000    ordered by Dr. Larose Kells, pt. reports thwe results were wnl.   . Anxiety   . Depression   . Asthma     "breathing is a little tighter today"- 01/11/2014  . Pneumonia    Past Surgical History  Procedure Laterality Date  . Appendectomy  01/1997  . Cesarean section  03/1996  . Tonsillectomy    . Plantar fascitis      right foot  . Pilonidal cyst excision    . Colonoscopy    . Esophagogastroduodenoscopy    . Breast biopsy Right 07/2007    neg  . Laminectomy N/A 01/14/2014    Procedure: CERVICAL LAMINECTOMY FOR TUMOR. 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 Back surgery     Family History  Problem Relation Age of Onset  . Melanoma Father   . Coronary artery disease Neg Hx   . Stroke Neg Hx   . Colon cancer Neg Hx   . Breast cancer Neg Hx   . Diabetes Other     cousin  . Hypertension Mother    History  Substance Use Topics  . Smoking status: Former Smoker    Quit date: 02/13/1996  . Smokeless tobacco: Never Used     Comment: used to smoke 2 ppd  . Alcohol Use: 0.0 oz/week    0 Standard drinks or equivalent per week     Comment: 2-3 x/week   OB History    No data available     Review of Systems  Constitutional: Negative for fever and chills.  Respiratory: Negative for cough, chest tightness and shortness of breath.   Cardiovascular: Negative for chest pain, palpitations and leg swelling.  Musculoskeletal: Positive for arthralgias. Negative for myalgias, neck pain and neck stiffness.  Skin: Positive for wound. Negative for rash.  Neurological: Negative for dizziness, weakness, numbness and headaches.  All other systems reviewed and are negative.     Allergies  Review of patient's allergies indicates no known allergies.  Home Medications   Prior to Admission medications   Medication Sig Start Date End Date Taking? Authorizing Provider  ALPRAZolam (XANAX) 0.5 MG tablet TAKE 1 TABLET BY MOUTH THREE TIMES DAILY AS NEEDED Patient taking differently: Take 0.5 mg by mouth 3 (three) times daily as needed for anxiety or sleep. TAKE 1 TABLET BY MOUTH THREE TIMES DAILY AS NEEDED 02/19/13   Colon Branch, MD  cholecalciferol (VITAMIN D) 1000 UNITS tablet Take 2,000 Units by mouth daily.      Historical Provider, MD  guaiFENesin (MUCINEX) 600 MG 12 hr tablet Take 1 tablet (600 mg total) by mouth 2 (two) times daily. 01/26/14   Irene Pap, NP  hydrochlorothiazide (HYDRODIURIL) 25 MG tablet TAKE 1 TABLET BY MOUTH EVERY DAY 03/23/14   Colon Branch, MD  levonorgestrel [facility]) 20 MCG/24HR IUD 1 each by Intrauterine route once.    Historical Provider, MD  Melatonin 5 MG TABS Take 5 mg by mouth daily as needed (sleep).     Historical Provider, MD  montelukast (SINGULAIR) 10 MG tablet TAKE 1 TABLET BY MOUTH EVERY NIGHT AT BEDTIME 02/10/14   Colon Branch, MD  nystatin (MYCOSTATIN) 100000 UNIT/ML suspension Take 5 mLs (500,000 Units total) by mouth 4 (four) times daily. 02/02/14   Colon Branch, MD  omeprazole (PRILOSEC) 40 MG capsule TAKE ONE CAPSULE BY MOUTH EVERY MORNING 03/23/14   Colon Branch, MD  oxyCODONE-acetaminophen (PERCOCET/ROXICET) 5-325 MG per tablet Take 1-2 tablets by mouth every 4 (four) hours as needed for moderate pain. 01/18/14   Eustace Moore, MD  ranitidine (ZANTAC) 300 MG tablet TAKE 1 TABLET BY MOUTH EVERY NIGHT AT BEDTIME 02/10/14   Colon Branch, MD  sertraline (ZOLOFT) 100 MG tablet Take 150 mg by mouth daily before breakfast.  07/17/13   Historical Provider, MD  SYMBICORT 160-4.5 MCG/ACT inhaler INHALE 2 PUFFS BY MOUTH TWICE DAILY 02/10/14   Colon Branch, MD  tiZANidine (ZANAFLEX) 4 MG capsule Take 4 mg by mouth 3 (three) times daily as needed for muscle spasms.    Historical Provider, MD  VENTOLIN HFA 108 (90 BASE) MCG/ACT inhaler USE 2 PUFFS EVERY 6 HOURS AS NEEDED FOR COUGH OR WHEEZE 02/10/14   Colon Branch, MD   BP 156/69 mmHg  Pulse 80  Temp(Src) 98.8 F (37.1 C) (Oral)  Resp 18  Ht 5\' 3"  (1.6 m)  Wt 220 lb (99.791 kg)  BMI 38.98 kg/m2  SpO2 98%  LMP 05/01/2010 Physical Exam  Constitutional: She is oriented to person, place, and time. She appears well-developed and well-nourished. No distress.  HENT:  Head: Normocephalic.  Eyes: Conjunctivae are normal.  Neck: Neck supple.  Cardiovascular: Normal rate, regular rhythm and normal heart sounds.   Pulmonary/Chest: Effort normal and breath sounds normal. No respiratory distress. She has no wheezes. She has no rales.  Musculoskeletal: She exhibits no edema.  Laceration to  the left 5th toe, 3 cm in length, across plantar surface of MTP joint, gaping. Cap refill <2 sec distally  Neurological: She is alert and oriented to person, place, and time.  Skin: Skin is warm and dry.  Psychiatric: She has a normal mood and affect. Her behavior is normal.  Nursing note and vitals reviewed.       ED Course  Procedures (including critical care time) Labs Review Labs Reviewed - No data to display  Imaging Review Dg Toe 5th Left  04/24/2014  CLINICAL DATA:  Injury to left fifth toe, with laceration at the plantar aspect of the toe. Initial encounter.  EXAM: DG TOE 5TH LEFT  COMPARISON:  None.  FINDINGS: The known soft tissue laceration is difficult to fully characterize on radiograph. No radiopaque foreign bodies are seen.  There appears to be mild plantar widening of the fifth proximal interphalangeal joint on the lateral view, which may reflect underlying ligamentous injury, or possibly a minimal impaction injury involving the distal aspect of the fifth proximal phalanx. Alternatively, this could be artifactual in nature.  Remaining visualized joint spaces are preserved.  IMPRESSION: Apparent mild plantar widening of the fifth proximal interphalangeal joint on the lateral view, which may reflect underlying ligamentous injury, or possibly a minimal impaction injury involving the distal aspect of the fifth proximal phalanx. Alternately, this could be artifactual in nature.   Electronically Signed   By: Garald Balding M.D.   On: 04/24/2014 18:15     EKG Interpretation None      LACERATION REPAIR Performed by: Jeannett Senior A Authorized by: Jeannett Senior A Consent: Verbal consent obtained. Risks and benefits: risks, benefits and alternatives were discussed Consent given by: patient Patient identity confirmed: provided demographic data Prepped and Draped in normal sterile fashion Wound explored  Laceration Location: left 5th toe  Laceration Length:  3cm  No Foreign Bodies seen or palpated  Anesthesia: local infiltration  Local anesthetic: lidocaine 2% wo epinephrine  Anesthetic total: 2 ml  Irrigation method: syringe Amount of cleaning: standard  Skin closure: prolene 4.0  Number of sutures: 3 loosely approximated  Technique: simple interrupted  Patient tolerance: Patient tolerated the procedure well with no immediate complications.  MDM   Final diagnoses:  Laceration of toe of left foot, initial encounter    patient with a toe laceration yesterday. Cleaned with antibacterial soap and water. Extensively irrigated with saline and safe cleanse. I discussed patient with Dr. Ninfa Linden with orthopedics, he advised to loosely close the wound and place her in a postop shoe, started on antibiotics, follow up with him in the office next week. Laceration loosely closed with 3 stitches. Postop shoe applied. Patient's tetanus is up-to-date. X-rays as described above. Will follow-up with Dr. Era Bumpers Vitals:   04/24/14 1402 04/24/14 1828 04/24/14 1944  BP: 172/80 156/69 165/75  Pulse: 94 80 80  Temp: 99 F (37.2 C) 98.8 F (37.1 C) 98.2 F (36.8 C)  TempSrc: Oral Oral Oral  Resp: 18 18 16   Height: 5\' 3"  (1.6 m)    Weight: 220 lb (99.791 kg)    SpO2: 97% 98% 100%     Jeannett Senior, PA-C 04/24/14 2353  Charlesetta Shanks, MD 04/30/14 [phone]

## 2014-05-06 ENCOUNTER — Encounter: Payer: Self-pay | Admitting: Internal Medicine

## 2014-05-06 ENCOUNTER — Ambulatory Visit (INDEPENDENT_AMBULATORY_CARE_PROVIDER_SITE_OTHER): Payer: BLUE CROSS/BLUE SHIELD | Admitting: Internal Medicine

## 2014-05-06 VITALS — BP 128/78 | HR 70 | Temp 98.6°F | Ht 63.0 in | Wt 227.1 lb

## 2014-05-06 DIAGNOSIS — F329 Major depressive disorder, single episode, unspecified: Secondary | ICD-10-CM

## 2014-05-06 DIAGNOSIS — J4541 Moderate persistent asthma with (acute) exacerbation: Secondary | ICD-10-CM | POA: Diagnosis not present

## 2014-05-06 DIAGNOSIS — K219 Gastro-esophageal reflux disease without esophagitis: Secondary | ICD-10-CM | POA: Diagnosis not present

## 2014-05-06 DIAGNOSIS — F419 Anxiety disorder, unspecified: Principal | ICD-10-CM

## 2014-05-06 DIAGNOSIS — F418 Other specified anxiety disorders: Secondary | ICD-10-CM | POA: Diagnosis not present

## 2014-05-06 MED ORDER — BUDESONIDE-FORMOTEROL FUMARATE 160-4.5 MCG/ACT IN AERO: 2.0000 | INHALATION_SPRAY | Freq: Two times a day (BID) | RESPIRATORY_TRACT | Status: DC

## 2014-05-06 MED ORDER — HYDROCHLOROTHIAZIDE 25 MG PO TABS: 25.0000 mg | ORAL_TABLET | Freq: Every day | ORAL | Status: DC

## 2014-05-06 MED ORDER — OMEPRAZOLE 40 MG PO CPDR: 40.0000 mg | DELAYED_RELEASE_CAPSULE | Freq: Every morning | ORAL | Status: DC

## 2014-05-06 MED ORDER — ALBUTEROL SULFATE HFA 108 (90 BASE) MCG/ACT IN AERS: 2.0000 | INHALATION_SPRAY | Freq: Four times a day (QID) | RESPIRATORY_TRACT | Status: DC | PRN

## 2014-05-06 MED ORDER — MONTELUKAST SODIUM 10 MG PO TABS: 10.0000 mg | ORAL_TABLET | Freq: Every day | ORAL | Status: DC

## 2014-05-06 MED ORDER — RANITIDINE HCL 300 MG PO TABS: 300.0000 mg | ORAL_TABLET | Freq: Every day | ORAL | Status: DC

## 2014-05-06 NOTE — Patient Instructions (Addendum)
Go to the lab for a UDS and contract    Come back to the office in 3 months   for a physical exam  Please schedule an appointment at the front desk    Come back fasting

## 2014-05-06 NOTE — Progress Notes (Signed)
Pre visit review using our clinic review tool, if applicable. No additional management support is needed unless otherwise documented below in the visit note. 

## 2014-05-06 NOTE — Progress Notes (Signed)
Subjective:    Patient ID: Vanessa Middleton, female    DOB: 12-18-1955, 59 y.o.   MRN: [id number]  DOS:  05/06/2014 Type of visit - description : rov Interval history: Went to the ER, not reviewed, on 04/24/2014 had a toe laceration, status post antibiotics. Denies any discharge, or pain. She does have some discomfort. GERD symptoms well controlled needs refills Hypertension, good medication compliance, ambulatory BPs in the 120s. needs refills. History of neck pain, well-controlled on muscle relaxants, has oxycodone but is not taking it. Anxiety, controlled, not taking Xanax lately.   Review of Systems  Denies chest or difficulty breathing. No nausea, vomiting, diarrhea Past Medical History  Diagnosis Date  . GERD (gastroesophageal reflux disease)     EGD 10/11  . Hypertension   . Fatty liver     per [place] 9/11  . Personal history of colonic polyps 11/23/2007  . Anxiety and depression   . Anxiety and depression 10-2011  . Complication of anesthesia   . PONV (postoperative nausea and vomiting)   . Shortness of breath dyspnea     /w exertion   . H/O pulmonary function tests ?2000    ordered by Dr. Larose Kells, pt. reports thwe results were wnl.   . Anxiety   . Depression   . Asthma     "breathing is a little tighter today"- 01/11/2014  . Pneumonia     Past Surgical History  Procedure Laterality Date  . Appendectomy  01/1997  . Cesarean section  03/1996  . Tonsillectomy    . Plantar fascitis      right foot  . Pilonidal cyst excision    . Colonoscopy    . Esophagogastroduodenoscopy    . Breast biopsy Right 07/2007    neg  . Laminectomy N/A 01/14/2014    Procedure: CERVICAL LAMINECTOMY FOR TUMOR. 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 Back surgery      History   Social History  . Marital Status: Divorced    Spouse Name: N/A  . Number of Children: 1   .  Years of Education: N/A   Occupational History  . Customer Service [employer])    Social History Main Topics  . Smoking status: Former Smoker    Quit date: 02/13/1996  . Smokeless tobacco: Never Used     Comment: used to smoke 2 ppd  . Alcohol Use: 0.0 oz/week    0 Standard drinks or equivalent per week     Comment: 2-3 x/week  . Drug Use: No  . Sexual Activity: Not on file   Other Topics Concern  . Not on file   Social History Narrative   Lives w/ son        Medication List       This list is accurate as of: 05/06/14 11:59 PM.  Always use your most recent med list.               albuterol 108 (90 BASE) MCG/ACT inhaler  Commonly known as:  VENTOLIN HFA  Inhale 2 puffs into the lungs every 6 (six) hours as needed for wheezing or shortness of breath.     ALPRAZolam 0.5 MG tablet  Commonly known as:  XANAX  TAKE 1 TABLET BY MOUTH THREE TIMES DAILY AS NEEDED     budesonide-formoterol 160-4.5 MCG/ACT inhaler  Commonly known as:  SYMBICORT  Inhale  2 puffs into the lungs 2 (two) times daily.     cholecalciferol 1000 UNITS tablet  Commonly known as:  VITAMIN D  Take 2,000 Units by mouth daily.     guaiFENesin 600 MG 12 hr tablet  Commonly known as:  MUCINEX  Take 1 tablet (600 mg total) by mouth 2 (two) times daily.     hydrochlorothiazide 25 MG tablet  Commonly known as:  HYDRODIURIL  Take 1 tablet (25 mg total) by mouth daily.     levonorgestrel 20 MCG/24HR IUD  Commonly known as:  MIRENA  1 each by Intrauterine route once.     Melatonin 5 MG Tabs  Take 5 mg by mouth daily as needed (sleep).     montelukast 10 MG tablet  Commonly known as:  SINGULAIR  Take 1 tablet (10 mg total) by mouth at bedtime.     omeprazole 40 MG capsule  Commonly known as:  PRILOSEC  Take 1 capsule (40 mg total) by mouth every morning.     oxyCODONE-acetaminophen 5-325 MG per tablet  Commonly known as:  PERCOCET/ROXICET  Take 1-2 tablets by mouth every 4 (four) hours as  needed for moderate pain.     ranitidine 300 MG tablet  Commonly known as:  ZANTAC  Take 1 tablet (300 mg total) by mouth at bedtime.     sertraline 100 MG tablet  Commonly known as:  ZOLOFT  Take 150 mg by mouth daily before breakfast.     tiZANidine 4 MG capsule  Commonly known as:  ZANAFLEX  Take 4 mg by mouth 3 (three) times daily as needed for muscle spasms.           Objective:   Physical Exam BP 128/78 mmHg  Pulse 70  Temp(Src) 98.6 F (37 C) (Oral)  Ht 5\' 3"  (1.6 m)  Wt 227 lb 2 oz (103.023 kg)  BMI 40.24 kg/m2  SpO2 97%  LMP 05/01/2010 General:   Well developed, well nourished . NAD.  HEENT:  Normocephalic . Face symmetric, atraumatic Lungs:  CTA B Normal respiratory effort, no intercostal retractions, no accessory muscle use. Heart: RRR,  no murmur.  Muscle skeletal: Left toe laceration: Area without redness, discharge, compared to the picture at the ER is healing nicely.  Neurologic:  alert & oriented X3.  Speech normal, gait appropriate for age and unassisted Psych--  Cognition and judgment appear intact.  Cooperative with normal attention span and concentration.  Behavior appropriate. No anxious or depressed appearing.        Assessment & Plan:    Toe laceration, discussed topical care, no use of infection, will call if the healing process does not continue as expected Hypertension, well-controlled, refill medications Asthma, well-controlled, refill singular GERD, well-controlled, refill PPIs and Zantac Anxiety, well-controlled, not taking Xanax but very rarely, check a UDS Pain control, has oxycodone but not taking it.  Follow-up 3 months, fasting for a physical

## 2014-05-18 ENCOUNTER — Telehealth: Payer: Self-pay

## 2014-05-18 NOTE — Telephone Encounter (Signed)
UDS: 05/06/2014  Negative for Alprazolam: PRN Positive for Sertraline  Low risk per Dr. Larose Kells 05/17/2014

## 2014-05-19 ENCOUNTER — Other Ambulatory Visit: Payer: Self-pay

## 2014-07-20 ENCOUNTER — Telehealth: Payer: Self-pay | Admitting: Internal Medicine

## 2014-07-20 NOTE — Telephone Encounter (Signed)
Pre Visit letter sent  °

## 2014-08-09 ENCOUNTER — Encounter: Payer: BLUE CROSS/BLUE SHIELD | Admitting: Internal Medicine

## 2014-09-22 ENCOUNTER — Other Ambulatory Visit: Payer: Self-pay | Admitting: Internal Medicine

## 2014-10-08 ENCOUNTER — Ambulatory Visit (INDEPENDENT_AMBULATORY_CARE_PROVIDER_SITE_OTHER): Payer: BLUE CROSS/BLUE SHIELD | Admitting: Internal Medicine

## 2014-10-08 ENCOUNTER — Encounter: Payer: Self-pay | Admitting: Internal Medicine

## 2014-10-08 VITALS — BP 142/78 | HR 85 | Temp 98.4°F | Ht 63.0 in | Wt 226.0 lb

## 2014-10-08 DIAGNOSIS — J4541 Moderate persistent asthma with (acute) exacerbation: Secondary | ICD-10-CM

## 2014-10-08 DIAGNOSIS — T7840XA Allergy, unspecified, initial encounter: Secondary | ICD-10-CM | POA: Diagnosis not present

## 2014-10-08 MED ORDER — PREDNISONE 10 MG PO TABS: ORAL_TABLET | ORAL | Status: DC

## 2014-10-08 MED ORDER — AZITHROMYCIN 250 MG PO TABS: ORAL_TABLET | ORAL | Status: DC

## 2014-10-08 MED ORDER — ALBUTEROL SULFATE 108 (90 BASE) MCG/ACT IN AEPB: 2.0000 | INHALATION_SPRAY | Freq: Four times a day (QID) | RESPIRATORY_TRACT | Status: DC | PRN

## 2014-10-08 NOTE — Progress Notes (Signed)
Subjective:    Patient ID: Vanessa Middleton, female    DOB: 15-Apr-1955, 59 y.o.   MRN: [id number]  DOS:  10/08/2014 Type of visit - description : Acute visit Interval history: Sx started 2 weeks ago with increased cough, wheezing.  Good compliance with asthma medications,;prior to his exacerbation she was doing well 4 months.  Also complained of welps on and off for 2 days, at different places, self resolved quickly. She has also on and off itching at different places.  Review of Systems No runny nose, sore throat. No sinus pain or congestion, no nasal discharge. + Subjective fever. Denies a history of lip or tongue swelling. Occasional sputum production: Yellow, greenish in color  Past Medical History  Diagnosis Date  . GERD (gastroesophageal reflux disease)     EGD 10/11  . Hypertension   . Fatty liver     per [place] 9/11  . Personal history of colonic polyps 11/23/2007  . Anxiety and depression   . Anxiety and depression 10-2011  . Complication of anesthesia   . PONV (postoperative nausea and vomiting)   . Shortness of breath dyspnea     /w exertion   . H/O pulmonary function tests ?2000    ordered by Dr. Larose Kells, pt. reports thwe results were wnl.   . Anxiety   . Depression   . Asthma     "breathing is a little tighter today"- 01/11/2014  . Pneumonia     Past Surgical History  Procedure Laterality Date  . Appendectomy  01/1997  . Cesarean section  03/1996  . Tonsillectomy    . Plantar fascitis      right foot  . Pilonidal cyst excision    . Colonoscopy    . Esophagogastroduodenoscopy    . Breast biopsy Right 07/2007    neg  . Laminectomy N/A 01/14/2014    Procedure: CERVICAL LAMINECTOMY FOR TUMOR. 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 Back surgery      Social History   Social History  . Marital Status: Divorced    Spouse Name: N/A  . Number of  Children: 1   . Years of Education: N/A   Occupational History  . Customer Service [employer])    Social History Main Topics  . Smoking status: Former Smoker    Quit date: 02/13/1996  . Smokeless tobacco: Never Used     Comment: used to smoke 2 ppd  . Alcohol Use: 0.0 oz/week    0 Standard drinks or equivalent per week     Comment: 2-3 x/week  . Drug Use: No  . Sexual Activity: Not on file   Other Topics Concern  . Not on file   Social History Narrative   Lives w/ son        Medication List       This list is accurate as of: 10/08/14  9:58 AM.  Always use your most recent med list.               albuterol 108 (90 BASE) MCG/ACT inhaler  Commonly known as:  VENTOLIN HFA  Inhale 2 puffs into the lungs every 6 (six) hours as needed for wheezing or shortness of breath.     ALPRAZolam 0.5 MG tablet  Commonly known as:  XANAX  TAKE 1 TABLET BY MOUTH THREE TIMES DAILY AS NEEDED  budesonide-formoterol 160-4.5 MCG/ACT inhaler  Commonly known as:  SYMBICORT  Inhale 2 puffs into the lungs daily.     cholecalciferol 1000 UNITS tablet  Commonly known as:  VITAMIN D  Take 2,000 Units by mouth daily.     guaiFENesin 600 MG 12 hr tablet  Commonly known as:  MUCINEX  Take 1 tablet (600 mg total) by mouth 2 (two) times daily.     hydrochlorothiazide 25 MG tablet  Commonly known as:  HYDRODIURIL  Take 1 tablet (25 mg total) by mouth daily.     levonorgestrel 20 MCG/24HR IUD  Commonly known as:  MIRENA  1 each by Intrauterine route once.     Melatonin 5 MG Tabs  Take 5 mg by mouth daily as needed (sleep).     montelukast 10 MG tablet  Commonly known as:  SINGULAIR  Take 1 tablet (10 mg total) by mouth at bedtime.     omeprazole 40 MG capsule  Commonly known as:  PRILOSEC  Take 1 capsule (40 mg total) by mouth every morning.     oxyCODONE-acetaminophen 5-325 MG per tablet  Commonly known as:  PERCOCET/ROXICET  Take 1-2 tablets by mouth every 4 (four) hours as  needed for moderate pain.     PRISTIQ 50 MG 24 hr tablet  Generic drug:  desvenlafaxine  Take 1 tablet by mouth daily.     ranitidine 300 MG tablet  Commonly known as:  ZANTAC  Take 1 tablet (300 mg total) by mouth at bedtime.     sertraline 100 MG tablet  Commonly known as:  ZOLOFT  Take 150 mg by mouth daily before breakfast.     tiZANidine 4 MG capsule  Commonly known as:  ZANAFLEX  Take 4 mg by mouth 3 (three) times daily as needed for muscle spasms.           Objective:   Physical Exam BP 142/78 mmHg  Pulse 85  Temp(Src) 98.4 F (36.9 C) (Oral)  Ht 5\' 3"  (1.6 m)  Wt 226 lb (102.513 kg)  BMI 40.04 kg/m2  SpO2 96%  LMP 05/01/2010 General:   Well developed, well nourished . NAD.  HEENT:  Normocephalic . Face symmetric, atraumatic TMs: Right obscured by wax, left normal. Flow symmetric, no red. Nose slightly congested. Sinuses no TTP. Lips and tongue: Normal  Lungs:  Positing wheezing, few rhonchi, no crackles Normal respiratory effort, no intercostal retractions, no accessory muscle use. Heart: RRR,  no murmur.  No pretibial edema bilaterally  Skin: At the time of the visit, she didn't have any whelps. Neurologic:  alert & oriented X3.  Speech normal, gait appropriate for age and unassisted Psych--  Cognition and judgment appear intact.  Cooperative with normal attention span and concentration.  Behavior appropriate. No anxious or depressed appearing.      Assessment & Plan:     Allergic reaction? Having welps on and off for 2 days, exam today is negative. No new medications except Pristiq but was prescribed several weeks ago She is getting prednisone for asthma which should help, also rec  Claritin. If symptoms increase his she will let me know.  Due for a physical, see instructions

## 2014-10-08 NOTE — Progress Notes (Signed)
Pre visit review using our clinic review tool, if applicable. No additional management support is needed unless otherwise documented below in the visit note. 

## 2014-10-08 NOTE — Patient Instructions (Addendum)
Continue your routine medications including Pro Air as needed for wheezing and congestion.  Prednisone as prescribed  Zithromax as prescribed  Take Claritin one tablet every night until better  For nasal congestion Use OTC Nasocort or Flonase : 2 nasal sprays on each side of the nose daily until you feel better  Call if not gradually better over the next  10 days  Call anytime if the symptoms are severe, you have high fever, short of breath, chest pain   As far as the hives: Call if symptoms severe or you have any swelling of the lip or tongue. You may need an allergist referral   Schedule a physical exam, fasting. We like to see you within 3 weeks

## 2014-10-10 NOTE — Assessment & Plan Note (Signed)
Asthma: Exacerbated for 2 weeks, previously well-controlled. Continue with maintenance meds, a prescription and coupon for albuterol provided Zithromax Reluctantly I prescribed prednisone because she has prediabetes but she has a history of admissions to the hospital for asthma. See instructions

## 2014-10-27 ENCOUNTER — Ambulatory Visit (INDEPENDENT_AMBULATORY_CARE_PROVIDER_SITE_OTHER): Payer: BLUE CROSS/BLUE SHIELD | Admitting: Internal Medicine

## 2014-10-27 ENCOUNTER — Encounter: Payer: Self-pay | Admitting: Internal Medicine

## 2014-10-27 VITALS — BP 126/76 | HR 87 | Temp 97.7°F | Ht 62.75 in | Wt 231.4 lb

## 2014-10-27 DIAGNOSIS — R7309 Other abnormal glucose: Secondary | ICD-10-CM

## 2014-10-27 DIAGNOSIS — Z1159 Encounter for screening for other viral diseases: Secondary | ICD-10-CM

## 2014-10-27 DIAGNOSIS — R7303 Prediabetes: Secondary | ICD-10-CM

## 2014-10-27 DIAGNOSIS — Z0000 Encounter for general adult medical examination without abnormal findings: Secondary | ICD-10-CM

## 2014-10-27 DIAGNOSIS — Z09 Encounter for follow-up examination after completed treatment for conditions other than malignant neoplasm: Secondary | ICD-10-CM

## 2014-10-27 DIAGNOSIS — J4541 Moderate persistent asthma with (acute) exacerbation: Secondary | ICD-10-CM

## 2014-10-27 DIAGNOSIS — Z23 Encounter for immunization: Secondary | ICD-10-CM | POA: Diagnosis not present

## 2014-10-27 DIAGNOSIS — Z1211 Encounter for screening for malignant neoplasm of colon: Secondary | ICD-10-CM

## 2014-10-27 DIAGNOSIS — Z114 Encounter for screening for human immunodeficiency virus [HIV]: Secondary | ICD-10-CM

## 2014-10-27 NOTE — Progress Notes (Signed)
Pre visit review using our clinic review tool, if applicable. No additional management support is needed unless otherwise documented below in the visit note. 

## 2014-10-27 NOTE — Progress Notes (Signed)
Subjective:    Patient ID: Vanessa Middleton, female    DOB: 12/11/1955, 59 y.o.   MRN: [id number]  DOS:  10/27/2014 Type of visit - description : CPX Interval history: She was recently seen with a asthma exacerbation, she improved temporarily but symptoms are gradually coming back: Cough, yellow sputum, some wheezing,    Review of Systems Constitutional: No fever. No chills. No unexplained wt changes. No unusual sweats  HEENT: No dental problems, no ear discharge, no facial swelling, no voice changes. No eye discharge, no eye  redness , no  intolerance to light   Respiratory: See history of present illness  Cardiovascular: No CP, no leg swelling , no  Palpitations  GI: no nausea, no vomiting, no diarrhea , no  abdominal pain.  No blood in the stools. No dysphagia, no odynophagia. Good compliance with GERD medications, no symptoms    Endocrine: No polyphagia, no polyuria , no polydipsia  GU: No dysuria, gross hematuria, difficulty urinating. No urinary urgency, no frequency.  Musculoskeletal: No joint swellings or unusual aches or pains  Skin: No change in the color of the skin, palor , no  Rash  Allergic, immunologic: No environmental allergies , no  food allergies  Neurological: No dizziness no  syncope. No headaches. No diplopia, no slurred, no slurred speech, no motor deficits, no facial  Numbness  Hematological: No enlarged lymph nodes, no easy bruising , no unusual bleedings  Psychiatry: No suicidal ideas, no hallucinations, no beavior problems, no confusion.  No unusual/severe anxiety, no depression   Past Medical History  Diagnosis Date  . GERD (gastroesophageal reflux disease)     EGD 10/11  . Hypertension   . Fatty liver     per [place] 9/11  . Personal history of colonic polyps 11/23/2007  . Anxiety and depression   . Anxiety and depression 10-2011  . Complication of anesthesia   . PONV (postoperative nausea and vomiting)   . Shortness of breath dyspnea    /w exertion   . H/O pulmonary function tests ?2000    ordered by Dr. Larose Kells, pt. reports thwe results were wnl.   . Anxiety   . Depression   . Asthma     "breathing is a little tighter today"- 01/11/2014  . Pneumonia     Past Surgical History  Procedure Laterality Date  . Appendectomy  01/1997  . Cesarean section  03/1996  . Tonsillectomy    . Plantar fascitis      right foot  . Pilonidal cyst excision    . Colonoscopy    . Esophagogastroduodenoscopy    . Breast biopsy Right 07/2007    neg  . Laminectomy N/A 01/14/2014    Procedure: CERVICAL LAMINECTOMY FOR TUMOR. 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 Marital Status: Divorced    Spouse Name: N/A  . Number of Children: 1   . Years of Education: N/A   Occupational History  . Customer Service [employer])    Social History Main Topics  . Smoking status: Former Smoker    Quit date: 02/13/1996  . Smokeless tobacco: Never Used     Comment: used to smoke 2 ppd  . Alcohol Use: 0.0 oz/week    0 Standard drinks or equivalent per week     Comment: 2-3 x/week  . Drug Use:  No  . Sexual Activity: Not on file   Other Topics Concern  . Not on file   Social History Narrative   Son at [employer], occupational)     Family History  Problem Relation Age of Onset  . Melanoma Father   . Coronary artery disease Neg Hx   . Stroke Neg Hx   . Colon cancer Neg Hx   . Breast cancer Neg Hx   . Diabetes Other     cousin  . Hypertension Mother        Medication List       This list is accurate as of: 10/27/14  7:17 PM.  Always use your most recent med list.               Albuterol Sulfate 108 (90 BASE) MCG/ACT Aepb  Commonly known as:  PROAIR RESPICLICK  Inhale 2 puffs into the lungs every 6 (six) hours as needed (wheezing).     budesonide-formoterol 160-4.5 MCG/ACT inhaler  Commonly  known as:  SYMBICORT  Inhale 2 puffs into the lungs daily.     cholecalciferol 1000 UNITS tablet  Commonly known as:  VITAMIN D  Take 2,000 Units by mouth daily.     hydrochlorothiazide 25 MG tablet  Commonly known as:  HYDRODIURIL  Take 1 tablet (25 mg total) by mouth daily.     levonorgestrel 20 MCG/24HR IUD  Commonly known as:  MIRENA  1 each by Intrauterine route once.     Melatonin 5 MG Tabs  Take 5 mg by mouth daily as needed (sleep).     montelukast 10 MG tablet  Commonly known as:  SINGULAIR  Take 1 tablet (10 mg total) by mouth at bedtime.     omeprazole 40 MG capsule  Commonly known as:  PRILOSEC  Take 1 capsule (40 mg total) by mouth every morning.     PRISTIQ 50 MG 24 hr tablet  Generic drug:  desvenlafaxine  Take 1 tablet by mouth daily.     ranitidine 300 MG tablet  Commonly known as:  ZANTAC  Take 1 tablet (300 mg total) by mouth at bedtime.           Objective:   Physical Exam BP 126/76 mmHg  Pulse 87  Temp(Src) 97.7 F (36.5 C) (Oral)  Ht 5' 2.75" (1.594 m)  Wt 231 lb 6 oz (104.951 kg)  BMI 41.31 kg/m2  SpO2 96%  LMP 05/01/2010 General:   Well developed, well nourished . NAD.  HEENT:  Normocephalic . Face symmetric, atraumatic  Neck: No thyromegaly Lungs:    few rhonchi, few end expiratory wheezes Normal respiratory effort, no intercostal retractions, no accessory muscle use. Heart: RRR,  no murmur.  no pretibial edema bilaterally  Abdomen:  Not distended, soft, non-tender. No rebound or rigidity. No mass,organomegaly Skin: Not pale. Not jaundice Neurologic:  alert & oriented X3.  Speech normal, gait appropriate for age and unassisted Psych--  Cognition and judgment appear intact.  Cooperative with normal attention span and concentration.  Behavior appropriate. No anxious or depressed appearing.    Assessment & Plan:   Problem list > HTN Pre-diabetes  Anxiety depression sees [facility] Asthma CXR RML nodule, CXR 2015   stable since 2012  GERD EGD 2011. H/o dysphagia  Fatty liver: ultrasound 10-2009 Gait disorder, hyperreflexia neurology eval 12-2013 >> Cspine laminectomy 01-2014 , released from surgery ~ [id number] Lichenoid keratosis  Dx 2014 Dr Delman Cheadle   A/P Asthma: Not well controlled despite good compliance  of medication and no GERD symptoms. Refer back to pulmonary. HTN: Seems well-controlled Prediabetes: Check A1c Next visit in 6 months

## 2014-10-27 NOTE — Assessment & Plan Note (Signed)
Asthma: Not well controlled despite good compliance of medication and no GERD symptoms. Refer back to pulmonary. HTN: Seems well-controlled Prediabetes: Check A1c

## 2014-10-27 NOTE — Patient Instructions (Signed)
  Please schedule labs to be done within few days (fasting)  Check the  blood pressure 2 or 3 times a month     Be sure your blood pressure is between 110/65 and  145/85.  if it is consistently higher or lower, let me know   Next visit  for a  routine checkup in 6 months, no fasting  (30 minutes) Please schedule an appointment at the front desk

## 2014-10-27 NOTE — Assessment & Plan Note (Addendum)
Td 2013; pnm 23: 2014, prevnar 10-2014 Flu shot at work   Colonoscopy 11-2009, 3 adenomatous polyps,due for a repeated Cscope ------ refer to GI Bone density ----> 12-2008--normal  Female care Needs to see gyn [facility] 02-2014 neg    diet and exercise discussed  Labs: CMP, FLP, CBC, hep C, HIV

## 2014-10-28 LAB — CBC WITH DIFFERENTIAL/PLATELET
BASOS PCT: 1.1 % (ref 0.0–3.0)
Basophils Absolute: 0.1 10*3/uL (ref 0.0–0.1)
EOS PCT: 2.3 % (ref 0.0–5.0)
Eosinophils Absolute: 0.2 10*3/uL (ref 0.0–0.7)
HCT: 38 % (ref 36.0–46.0)
Hemoglobin: 12.3 g/dL (ref 12.0–15.0)
LYMPHS ABS: 1.8 10*3/uL (ref 0.7–4.0)
Lymphocytes Relative: 26.5 % (ref 12.0–46.0)
MCHC: 32.3 g/dL (ref 30.0–36.0)
MCV: 80.3 fl (ref 78.0–100.0)
MONO ABS: 0.5 10*3/uL (ref 0.1–1.0)
MONOS PCT: 7.9 % (ref 3.0–12.0)
Neutro Abs: 4.2 10*3/uL (ref 1.4–7.7)
Neutrophils Relative %: 62.2 % (ref 43.0–77.0)
Platelets: 353 10*3/uL (ref 150.0–400.0)
RBC: 4.73 Mil/uL (ref 3.87–5.11)
RDW: 15.6 % — AB (ref 11.5–15.5)
WBC: 6.7 10*3/uL (ref 4.0–10.5)

## 2014-10-28 LAB — LIPID PANEL
Cholesterol: 211 mg/dL — ABNORMAL HIGH (ref 0–200)
HDL: 54.4 mg/dL (ref >39.00)
LDL Cholesterol: 136 mg/dL — ABNORMAL HIGH (ref 0–99)
NonHDL: 157.01
Total CHOL/HDL Ratio: 4
Triglycerides: 104 mg/dL (ref 0.0–149.0)
VLDL: 20.8 mg/dL (ref 0.0–40.0)

## 2014-10-28 LAB — COMPREHENSIVE METABOLIC PANEL
ALBUMIN: 4.2 g/dL (ref 3.5–5.2)
ALK PHOS: 73 U/L (ref 39–117)
ALT: 22 U/L (ref 0–35)
AST: 18 U/L (ref 0–37)
BUN: 14 mg/dL (ref 6–23)
CO2: 27 mEq/L (ref 19–32)
Calcium: 9.2 mg/dL (ref 8.4–10.5)
Chloride: 99 mEq/L (ref 96–112)
Creatinine, Ser: 0.78 mg/dL (ref 0.40–1.20)
GFR: 80.44 mL/min (ref >60.00)
GLUCOSE: 95 mg/dL (ref 70–99)
POTASSIUM: 3.5 meq/L (ref 3.5–5.1)
Sodium: 136 mEq/L (ref 135–145)
Total Bilirubin: 0.4 mg/dL (ref 0.2–1.2)
Total Protein: 6.9 g/dL (ref 6.0–8.3)

## 2014-10-28 LAB — HEPATITIS C ANTIBODY: HCV Ab: NEGATIVE

## 2014-10-28 LAB — HEMOGLOBIN A1C: HEMOGLOBIN A1C: 6.1 % (ref 4.6–6.5)

## 2014-10-28 LAB — HIV ANTIBODY (ROUTINE TESTING W REFLEX): HIV 1&2 Ab, 4th Generation: NONREACTIVE

## 2014-11-01 ENCOUNTER — Encounter: Payer: Self-pay | Admitting: Internal Medicine

## 2014-11-02 ENCOUNTER — Encounter: Payer: Self-pay | Admitting: Internal Medicine

## 2014-12-02 ENCOUNTER — Other Ambulatory Visit: Payer: Self-pay | Admitting: Internal Medicine

## 2014-12-22 ENCOUNTER — Ambulatory Visit (AMBULATORY_SURGERY_CENTER): Payer: Self-pay

## 2014-12-22 VITALS — Ht 63.0 in | Wt 234.0 lb

## 2014-12-22 DIAGNOSIS — Z86010 Personal history of colonic polyps: Secondary | ICD-10-CM

## 2014-12-22 NOTE — Progress Notes (Signed)
No egg or soy allergies Not on home 02 No previous anesthesia complications No diet or weight loss meds 

## 2014-12-30 ENCOUNTER — Encounter: Payer: Self-pay | Admitting: Internal Medicine

## 2015-01-05 ENCOUNTER — Encounter: Payer: Self-pay | Admitting: Internal Medicine

## 2015-01-05 ENCOUNTER — Ambulatory Visit (AMBULATORY_SURGERY_CENTER): Payer: BLUE CROSS/BLUE SHIELD | Admitting: Internal Medicine

## 2015-01-05 VITALS — BP 118/75 | HR 74 | Temp 97.8°F | Resp 23 | Ht 63.0 in | Wt 234.0 lb

## 2015-01-05 DIAGNOSIS — Z86010 Personal history of colonic polyps: Secondary | ICD-10-CM

## 2015-01-05 MED ORDER — SODIUM CHLORIDE 0.9 % IV SOLN: 500.0000 mL | INTRAVENOUS | Status: DC

## 2015-01-05 NOTE — Patient Instructions (Addendum)
No polyps today!  Your next routine colonoscopy should be in 5 years - 2021.  I appreciate the opportunity to care for you. Gatha Mayer, MD, FACG  YOU HAD AN ENDOSCOPIC PROCEDURE TODAY AT [facility]:   Refer to the procedure report that was given to you for any specific questions about what was found during the examination.  If the procedure report does not answer your questions, please call your gastroenterologist to clarify.  If you requested that your care partner not be given the details of your procedure findings, then the procedure report has been included in a sealed envelope for you to review at your convenience later.  YOU SHOULD EXPECT: Some feelings of bloating in the abdomen. Passage of more gas than usual.  Walking can help get rid of the air that was put into your GI tract during the procedure and reduce the bloating. If you had a lower endoscopy (such as a colonoscopy or flexible sigmoidoscopy) you may notice spotting of blood in your stool or on the toilet paper. If you underwent a bowel prep for your procedure, you may not have a normal bowel movement for a few days.  Please Note:  You might notice some irritation and congestion in your nose or some drainage.  This is from the oxygen used during your procedure.  There is no need for concern and it should clear up in a day or so.  SYMPTOMS TO REPORT IMMEDIATELY:   Following lower endoscopy (colonoscopy or flexible sigmoidoscopy):  Excessive amounts of blood in the stool  Significant tenderness or worsening of abdominal pains  Swelling of the abdomen that is new, acute  Fever of 100F or higher   Following upper endoscopy (EGD)  Vomiting of blood or coffee ground material  New chest pain or pain under the shoulder blades  Painful or persistently difficult swallowing  New shortness of breath  Fever of 100F or higher  Black, tarry-looking stools  For urgent or emergent issues, a  gastroenterologist can be reached at any hour by calling [phone].   DIET: Your first meal following the procedure should be a small meal and then it is ok to progress to your normal diet. Heavy or fried foods are harder to digest and may make you feel nauseous or bloated.  Likewise, meals heavy in dairy and vegetables can increase bloating.  Drink plenty of fluids but you should avoid alcoholic beverages for 24 hours.  ACTIVITY:  You should plan to take it easy for the rest of today and you should NOT DRIVE or use heavy machinery until tomorrow (because of the sedation medicines used during the test).    FOLLOW UP: Our staff will call the number listed on your records the next business day following your procedure to check on you and address any questions or concerns that you may have regarding the information given to you following your procedure. If we do not reach you, we will leave a message.  However, if you are feeling well and you are not experiencing any problems, there is no need to return our call.  We will assume that you have returned to your regular daily activities without incident.  If any biopsies were taken you will be contacted by phone or by letter within the next 1-3 weeks.  Please call us at [phone] if you have not heard about the biopsies in 3 weeks.    SIGNATURES/CONFIDENTIALITY: You and/or your care partner have  signed paperwork which will be entered into your electronic medical record.  These signatures attest to the fact that that the information above on your After Visit Summary has been reviewed and is understood.  Full responsibility of the confidentiality of this discharge information lies with you and/or your care-partner.

## 2015-01-05 NOTE — Progress Notes (Signed)
To recovery, report to Scott, RN, VSS 

## 2015-01-05 NOTE — Op Note (Signed)
[place]  [address] [place], [address]   COLONOSCOPY PROCEDURE REPORT  PATIENT: Vanessa Middleton, Vanessa Middleton  MR#: [id number] BIRTHDATE: 11-05-1955 , 66  yrs. old GENDER: female ENDOSCOPIST: Gatha Mayer, MD, [facility] PROCEDURE DATE:  01/05/2015 PROCEDURE:   Colonoscopy, surveillance First Screening Colonoscopy - Avg.  risk and is 50 yrs.  old or older - No.  Prior Negative Screening - Now for repeat screening. N/A  History of Adenoma - Now for follow-up colonoscopy & has been > or = to 3 yrs.  Yes hx of adenoma.  Has been 3 or more years since last colonoscopy.  Polyps removed today? No Recommend repeat exam, <10 yrs? Yes high risk ASA CLASS:   Class III INDICATIONS:Surveillance due to prior colonic neoplasia and PH Colon Adenoma. MEDICATIONS: Propofol 250 mg IV and Monitored anesthesia care  DESCRIPTION OF PROCEDURE:   After the risks benefits and alternatives of the procedure were thoroughly explained, informed consent was obtained.  The digital rectal exam revealed no abnormalities of the rectum.   The LB [id number]  endoscope was introduced through the anus and advanced to the cecum, which was identified by both the appendix and ileocecal valve. No adverse events experienced.   The quality of the prep was excellent. (MiraLax was used)  The instrument was then slowly withdrawn as the colon was fully examined. Estimated blood loss is zero unless otherwise noted in this procedure report.      COLON FINDINGS: A normal appearing cecum, ileocecal valve, and appendiceal orifice were identified.  The ascending, transverse, descending, sigmoid colon, and rectum appeared unremarkable. Retroflexed views revealed no abnormalities. The time to cecum = 4.6 Withdrawal time = 9.0   The scope was withdrawn and the procedure completed. COMPLICATIONS: There were no immediate complications.  ENDOSCOPIC IMPRESSION: Normal colonoscopy - excellent prep - hx 3 diminutive  adenomas 2011  RECOMMENDATIONS: Repeat Colonoscopy in 5 years.  eSigned:  Gatha Mayer, MD, [facility] 01/05/2015 11:22 AM   cc: The Patient

## 2015-01-10 ENCOUNTER — Telehealth: Payer: Self-pay

## 2015-01-10 NOTE — Telephone Encounter (Signed)
  Follow up Call-  Call back number 01/05/2015  Post procedure Call Back phone  # [phone]  Permission to leave phone message Yes     Patient questions:  Do you have a fever, pain , or abdominal swelling? No. Pain Score  0 *  Have you tolerated food without any problems? Yes.    Have you been able to return to your normal activities? Yes.    Do you have any questions about your discharge instructions: Diet   No. Medications  No. Follow up visit  No.  Do you have questions or concerns about your Care? No.  Actions: * If pain score is 4 or above: No action needed, pain <4.

## 2015-01-31 ENCOUNTER — Other Ambulatory Visit: Payer: Self-pay | Admitting: Internal Medicine

## 2015-03-11 ENCOUNTER — Other Ambulatory Visit: Payer: Self-pay | Admitting: Internal Medicine

## 2015-03-11 NOTE — Telephone Encounter (Signed)
3 pt rx request refilled with #[id number] with 0rf

## 2015-04-27 ENCOUNTER — Ambulatory Visit (INDEPENDENT_AMBULATORY_CARE_PROVIDER_SITE_OTHER): Payer: BLUE CROSS/BLUE SHIELD | Admitting: Internal Medicine

## 2015-04-27 ENCOUNTER — Encounter: Payer: Self-pay | Admitting: Internal Medicine

## 2015-04-27 VITALS — BP 116/74 | HR 66 | Temp 97.7°F | Ht 63.0 in | Wt 235.4 lb

## 2015-04-27 DIAGNOSIS — R7303 Prediabetes: Secondary | ICD-10-CM

## 2015-04-27 DIAGNOSIS — J3089 Other allergic rhinitis: Secondary | ICD-10-CM

## 2015-04-27 DIAGNOSIS — J4541 Moderate persistent asthma with (acute) exacerbation: Secondary | ICD-10-CM | POA: Diagnosis not present

## 2015-04-27 DIAGNOSIS — Z09 Encounter for follow-up examination after completed treatment for conditions other than malignant neoplasm: Secondary | ICD-10-CM

## 2015-04-27 MED ORDER — AZELASTINE HCL 0.1 % NA SOLN: 2.0000 | Freq: Every evening | NASAL | Status: DC | PRN

## 2015-04-27 NOTE — Progress Notes (Signed)
Pre visit review using our clinic review tool, if applicable. No additional management support is needed unless otherwise documented below in the visit note. 

## 2015-04-27 NOTE — Progress Notes (Signed)
Subjective:    Patient ID: Vanessa Middleton, female    DOB: 10-Aug-1955, 60 y.o.   MRN: [id number]  DOS:  04/27/2015 Type of visit - description : Follow-up Interval history:  Asthma: Did not get to see pulmonary however she is doing better, still has episodic cough and wheezing, uses albuterol on average twice a week. She is having more problems with allergies: Sinus pain, congestion, postnasal dripping. Good medication compliance, no apparent side effects.  Review of Systems + Itchy eyes and nose on and off Denies chest pain, nausea, vomiting, diarrhea Past Medical History  Diagnosis Date  . GERD (gastroesophageal reflux disease)     EGD 10/11  . Hypertension   . Fatty liver     per [place] 9/11  . Personal history of colonic polyps 11/23/2007  . Anxiety and depression   . Anxiety and depression 10-2011  . Complication of anesthesia   . PONV (postoperative nausea and vomiting)   . Shortness of breath dyspnea     /w exertion   . H/O pulmonary function tests ?2000    ordered by Dr. Larose Kells, pt. reports thwe results were wnl.   . Anxiety   . Depression   . Asthma     "breathing is a little tighter today"- 01/11/2014  . Pneumonia     Past Surgical History  Procedure Laterality Date  . Appendectomy  01/1997  . Cesarean section  03/1996  . Tonsillectomy    . Plantar fascitis      right foot  . Pilonidal cyst excision    . Colonoscopy    . Esophagogastroduodenoscopy    . Breast biopsy Right 07/2007    neg  . Laminectomy N/A 01/14/2014    Procedure: CERVICAL LAMINECTOMY FOR TUMOR. 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 Marital Status: Divorced    Spouse Name: N/A  . Number of Children: 1   . Years of Education: N/A   Occupational History  . Customer Service [employer])    Social History Main Topics  . Smoking status:  Former Smoker    Quit date: 02/13/1996  . Smokeless tobacco: Never Used     Comment: used to smoke 2 ppd  . Alcohol Use: 3.0 oz/week    5 Standard drinks or equivalent per week     Comment: 5x/week  . Drug Use: No  . Sexual Activity: Not on file   Other Topics Concern  . Not on file   Social History Narrative   Son at [employer])        Medication List       This list is accurate as of: 04/27/15 11:59 PM.  Always use your most recent med list.               Albuterol Sulfate 108 (90 Base) MCG/ACT Aepb  Commonly known as:  PROAIR RESPICLICK  Inhale 2 puffs into the lungs every 6 (six) hours as needed (wheezing).     azelastine 0.1 % nasal spray  Commonly known as:  ASTELIN  Place 2 sprays into both nostrils at bedtime as needed for rhinitis. Use in each nostril as directed     budesonide-formoterol 160-4.5 MCG/ACT inhaler  Commonly known as:  SYMBICORT  Inhale 2 puffs into the lungs daily.     cholecalciferol 1000 units tablet  Commonly known as:  VITAMIN D  Take 2,000 Units by mouth daily.     fluticasone 50 MCG/ACT nasal spray  Commonly known as:  FLONASE  Place 2 sprays into both nostrils daily.     hydrochlorothiazide 25 MG tablet  Commonly known as:  HYDRODIURIL  Take 1 tablet (25 mg total) by mouth daily.     levonorgestrel 20 MCG/24HR IUD  Commonly known as:  MIRENA  1 each by Intrauterine route once.     loratadine 10 MG tablet  Commonly known as:  CLARITIN  Take 10 mg by mouth daily.     montelukast 10 MG tablet  Commonly known as:  SINGULAIR  TAKE 1 TABLET BY MOUTH EVERY NIGHT AT BEDTIME     omeprazole 40 MG capsule  Commonly known as:  PRILOSEC  TAKE 1 CAPSULE BY MOUTH EVERY MORNING     PRISTIQ 50 MG 24 hr tablet  Generic drug:  desvenlafaxine  Take 1 tablet by mouth daily.     ranitidine 300 MG tablet  Commonly known as:  ZANTAC  TAKE 1 TABLET BY MOUTH EVERY NIGHT AT BEDTIME           Objective:   Physical Exam BP 116/74  mmHg  Pulse 66  Temp(Src) 97.7 F (36.5 C) (Oral)  Ht 5\' 3"  (1.6 m)  Wt 235 lb 6 oz (106.765 kg)  BMI 41.71 kg/m2  SpO2 96% General:   Well developed, well nourished . NAD.  HEENT:  Normocephalic . Face symmetric, atraumatic. Nose is slightly congested, sinuses no TTP Lungs:  Few scattered rhonchi otherwise clear Normal respiratory effort, no intercostal retractions, no accessory muscle use. Heart: RRR,  no murmur.  No pretibial edema bilaterally  Skin: Not pale. Not jaundice Neurologic:  alert & oriented X3.  Speech normal, gait appropriate for age and unassisted Psych--  Cognition and judgment appear intact.  Cooperative with normal attention span and concentration.  Behavior appropriate. No anxious or depressed appearing.      Assessment & Plan:   Assessment> HTN Pre-diabetes  Anxiety depression sees [facility] Asthma CXR RML nodule, CXR 2015  stable since 2012  GERD EGD 2011. H/o dysphagia  Fatty liver: ultrasound 10-2009 Gait disorder, hyperreflexia neurology eval 12-2013 >> Cspine laminectomy 01-2014 , released from surgery ~ [id number] Lichenoid keratosis  Dx 2014 Dr Delman Cheadle  H/o remote R eye: h/o histoplasmosis   Plan: Asthma: Improved compared to the last time she was here, using albuterol twice a week on average. Was unable to see pulmonary. Plan: No change, if symptoms increase she is to let me know  Allergies: Continue with sinus congestion and itchy eyes despite taking Claritin and Flonase. Add Astelin. Call if no better prediabetes: Last A1c satisfactory, LDL was 136, recommend to watch diet for now, goal LDL ~100.  RTC 6 months, CPX

## 2015-04-27 NOTE — Patient Instructions (Addendum)
GO TO THE FRONT DESK Schedule your next appointment for a  Physical exam  When?   In 6 months  Fasting?  Yes

## 2015-04-28 DIAGNOSIS — J309 Allergic rhinitis, unspecified: Secondary | ICD-10-CM | POA: Insufficient documentation

## 2015-04-28 NOTE — Assessment & Plan Note (Addendum)
Asthma: Improved compared to the last time she was here, using albuterol twice a week on average. Was unable to see pulmonary. Plan: No change, if symptoms increase she is to let me know  Allergies: Continue with sinus congestion and itchy eyes despite taking Claritin and Flonase. Add Astelin. Call if no better prediabetes: Last A1c satisfactory, LDL was 136, recommend to watch diet for now, goal LDL ~100.  RTC 6 months, CPX

## 2015-05-28 ENCOUNTER — Other Ambulatory Visit: Payer: Self-pay | Admitting: Internal Medicine

## 2015-06-11 ENCOUNTER — Other Ambulatory Visit: Payer: Self-pay | Admitting: Internal Medicine

## 2015-06-13 ENCOUNTER — Other Ambulatory Visit: Payer: Self-pay | Admitting: Internal Medicine

## 2015-06-13 NOTE — Telephone Encounter (Signed)
Medication filled to pharmacy as requested.   

## 2015-06-14 NOTE — Telephone Encounter (Signed)
Medication filled to pharmacy as requested.   

## 2015-07-25 ENCOUNTER — Other Ambulatory Visit: Payer: Self-pay | Admitting: Internal Medicine

## 2015-09-19 ENCOUNTER — Other Ambulatory Visit: Payer: Self-pay | Admitting: Internal Medicine

## 2015-11-01 ENCOUNTER — Encounter: Payer: Self-pay | Admitting: Internal Medicine

## 2015-11-01 ENCOUNTER — Ambulatory Visit (INDEPENDENT_AMBULATORY_CARE_PROVIDER_SITE_OTHER): Payer: BLUE CROSS/BLUE SHIELD | Admitting: Internal Medicine

## 2015-11-01 VITALS — BP 118/72 | HR 72 | Temp 98.4°F | Resp 14 | Ht 63.0 in | Wt 239.0 lb

## 2015-11-01 DIAGNOSIS — Z0000 Encounter for general adult medical examination without abnormal findings: Secondary | ICD-10-CM

## 2015-11-01 DIAGNOSIS — R739 Hyperglycemia, unspecified: Secondary | ICD-10-CM

## 2015-11-01 LAB — HEMOGLOBIN A1C: Hgb A1c MFr Bld: 6.1 % (ref 4.6–6.5)

## 2015-11-01 LAB — AST: AST: 20 U/L (ref 0–37)

## 2015-11-01 LAB — ALT: ALT: 24 U/L (ref 0–35)

## 2015-11-01 LAB — LIPID PANEL
CHOL/HDL RATIO: 4
Cholesterol: 184 mg/dL (ref 0–200)
HDL: 45.4 mg/dL (ref >39.00)
LDL Cholesterol: 110 mg/dL — ABNORMAL HIGH (ref 0–99)
NONHDL: 138.75
Triglycerides: 146 mg/dL (ref 0.0–149.0)
VLDL: 29.2 mg/dL (ref 0.0–40.0)

## 2015-11-01 LAB — CBC WITH DIFFERENTIAL/PLATELET
BASOS ABS: 0 10*3/uL (ref 0.0–0.1)
Basophils Relative: 0.4 % (ref 0.0–3.0)
Eosinophils Absolute: 0.1 10*3/uL (ref 0.0–0.7)
Eosinophils Relative: 1.7 % (ref 0.0–5.0)
HEMATOCRIT: 37.3 % (ref 36.0–46.0)
HEMOGLOBIN: 12.4 g/dL (ref 12.0–15.0)
LYMPHS PCT: 24.8 % (ref 12.0–46.0)
Lymphs Abs: 1.6 10*3/uL (ref 0.7–4.0)
MCHC: 33.3 g/dL (ref 30.0–36.0)
MCV: 81.3 fl (ref 78.0–100.0)
MONOS PCT: 9.6 % (ref 3.0–12.0)
Monocytes Absolute: 0.6 10*3/uL (ref 0.1–1.0)
Neutro Abs: 4.1 10*3/uL (ref 1.4–7.7)
Neutrophils Relative %: 63.5 % (ref 43.0–77.0)
Platelets: 377 10*3/uL (ref 150.0–400.0)
RBC: 4.59 Mil/uL (ref 3.87–5.11)
RDW: 13.2 % (ref 11.5–15.5)
WBC: 6.5 10*3/uL (ref 4.0–10.5)

## 2015-11-01 LAB — BASIC METABOLIC PANEL
BUN: 14 mg/dL (ref 6–23)
CO2: 29 mEq/L (ref 19–32)
CREATININE: 0.8 mg/dL (ref 0.40–1.20)
Calcium: 9.2 mg/dL (ref 8.4–10.5)
Chloride: 102 mEq/L (ref 96–112)
GFR: 77.85 mL/min (ref >60.00)
Glucose, Bld: 110 mg/dL — ABNORMAL HIGH (ref 70–99)
POTASSIUM: 3.7 meq/L (ref 3.5–5.1)
Sodium: 140 mEq/L (ref 135–145)

## 2015-11-01 LAB — TSH: TSH: 2.37 u[IU]/mL (ref 0.35–4.50)

## 2015-11-01 NOTE — Patient Instructions (Signed)
GO TO THE LAB : Get the blood work     GO TO THE FRONT DESK Schedule your next appointment for a  Physical in 1 year   Check the  blood pressure   Monthly  Be sure your blood pressure is between 110/65 and  145/85. If it is consistently higher or lower, let me know

## 2015-11-01 NOTE — Progress Notes (Signed)
Pre visit review using our clinic review tool, if applicable. No additional management support is needed unless otherwise documented below in the visit note. 

## 2015-11-01 NOTE — Progress Notes (Signed)
Subjective:    Patient ID: Vanessa Middleton, female    DOB: 03-May-1955, 60 y.o.   MRN: [id number]  DOS:  11/01/2015 Type of visit - description : cpx Interval history: Good med compliance, feeling well, no major concerns   Review of Systems Constitutional: No fever. No chills. No unexplained wt changes. No unusual sweats  HEENT: No dental problems, no ear discharge, no facial swelling, no voice changes. No eye discharge, no eye  redness , no  intolerance to light   Respiratory: History of asthma, good compliance with inhalers, occasional cough, occasionally uses albuterol.  rarely has wheezing  Cardiovascular: No CP, no leg swelling , no  Palpitations  GI: no nausea, no vomiting, no diarrhea , no  abdominal pain.  No blood in the stools. No dysphagia, no odynophagia    Endocrine: No polyphagia, no polyuria , no polydipsia  GU: No dysuria, gross hematuria, difficulty urinating. No urinary urgency, no frequency.  Musculoskeletal: No joint swellings or unusual aches or pains  Skin: No change in the color of the skin, palor , no  Rash  Allergic, immunologic: No environmental allergies , no  food allergies  Neurological: No dizziness no  syncope. No headaches. No diplopia, no slurred, no slurred speech, no motor deficits, no facial  Numbness  Hematological: No enlarged lymph nodes, no easy bruising , no unusual bleedings  Psychiatry: No suicidal ideas, no hallucinations, no beavior problems, no confusion.  No unusual/severe anxiety, no depression   Past Medical History:  Diagnosis Date  . Anxiety   . Anxiety and depression   . Anxiety and depression 10-2011  . Asthma    "breathing is a little tighter today"- 01/11/2014  . Complication of anesthesia   . Depression   . Fatty liver    per [place] 9/11  . GERD (gastroesophageal reflux disease)    EGD 10/11  . H/O pulmonary function tests ?2000   ordered by Dr. Larose Kells, pt. reports thwe results were wnl.   . Hypertension   .  Personal history of colonic polyps 11/23/2007  . Pneumonia   . PONV (postoperative nausea and vomiting)   . Shortness of breath dyspnea    /w exertion     Past Surgical History:  Procedure Laterality Date  . APPENDECTOMY  01/1997  . BREAST BIOPSY Right 07/2007   neg  . CESAREAN SECTION  03/1996  . LAMINECTOMY N/A 01/14/2014   Procedure: CERVICAL LAMINECTOMY FOR TUMOR. 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 PILONIDAL CYST EXCISION    . TONSILLECTOMY      Social History   Social History  . Marital status: Divorced    Spouse name: N/A  . Number of children: 1   . Years of education: N/A   Occupational History  . Customer Service [employer])    Social History Main Topics  . Smoking status: Former Smoker    Quit date: 02/13/1996  . Smokeless tobacco: Never Used     Comment: used to smoke 2 ppd  . Alcohol use 3.0 oz/week    5 Standard drinks or equivalent per week     Comment: 5x/week  . Drug use: No  . Sexual activity: Not on file   Other Topics Concern  . 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 Melanoma Father   . Hypertension Mother   . Diabetes Other     cousin  . Coronary artery disease Neg Hx   . Stroke Neg Hx   . Colon cancer Neg Hx   . Breast cancer Neg Hx        Medication List       Accurate as of 11/01/15  9:16 AM. Always use your most recent med list.          albuterol 108 (90 Base) MCG/ACT inhaler Commonly known as:  VENTOLIN HFA Inhale 2 puffs into the lungs every 6 (six) hours as needed for wheezing or shortness of breath.   azelastine 0.1 % nasal spray Commonly known as:  ASTELIN Place 2 sprays into both nostrils at bedtime as needed for rhinitis. Use in each nostril as directed   budesonide-formoterol 160-4.5 MCG/ACT inhaler Commonly known as:   SYMBICORT Inhale 2 puffs into the lungs daily.   cholecalciferol 1000 units tablet Commonly known as:  VITAMIN D Take 2,000 Units by mouth daily.   fluticasone 50 MCG/ACT nasal spray Commonly known as:  FLONASE Place 2 sprays into both nostrils daily.   hydrochlorothiazide 25 MG tablet Commonly known as:  HYDRODIURIL Take 1 tablet (25 mg total) by mouth daily.   levonorgestrel 20 MCG/24HR IUD Commonly known as:  MIRENA 1 each by Intrauterine route once.   loratadine 10 MG tablet Commonly known as:  CLARITIN Take 10 mg by mouth daily.   montelukast 10 MG tablet Commonly known as:  SINGULAIR TAKE 1 TABLET BY MOUTH EVERY NIGHT AT BEDTIME   omeprazole 40 MG capsule Commonly known as:  PRILOSEC Take 1 capsule (40 mg total) by mouth every morning.   PRISTIQ 50 MG 24 hr tablet Generic drug:  desvenlafaxine Take 1 tablet by mouth daily.   ranitidine 300 MG tablet Commonly known as:  ZANTAC Take 1 tablet (300 mg total) by mouth at bedtime.          Objective:   Physical Exam BP 118/72 (BP Location: Left Arm, Patient Position: Sitting, Cuff Size: Normal)   Pulse 72   Temp 98.4 F (36.9 C) (Oral)   Resp 14   Ht 5\' 3"  (1.6 m)   Wt 239 lb (108.4 kg)   BMI 42.34 kg/m   General:   Well developed, well nourished . NAD.  Neck: No  thyromegaly  HEENT:  Normocephalic . Face symmetric, atraumatic Lungs:  CTA B Normal respiratory effort, no intercostal retractions, no accessory muscle use. Heart: RRR,  no murmur.  No pretibial edema bilaterally  Abdomen:  Not distended, soft, non-tender. No rebound or rigidity.   Skin: Exposed areas without rash. Not pale. Not jaundice Neurologic:  alert & oriented X3.  Speech normal, gait appropriate for age and unassisted Strength symmetric and appropriate for age.  Psych: Cognition and judgment appear intact.  Cooperative with normal attention span and concentration.  Behavior appropriate. No anxious or depressed appearing.     Assessment & Plan:    Assessment> HTN Pre-diabetes  Anxiety depression sees [facility] Asthma CXR RML nodule, CXR 2015  stable since 2012  GERD EGD 2011. H/o dysphagia  Fatty liver: ultrasound 10-2009 Gait disorder, hyperreflexia neurology eval 12-2013 >> Cspine laminectomy 01-2014 , released from surgery ~ [id number] Lichenoid keratosis  Dx 2014 Dr Delman Cheadle  H/o remote R eye: h/o histoplasmosis   PLAN: prediabetes: Concept of prediabetes discuss, extensive discussion about diet HTN: Continue hydrochlorothiazide, BP today is  great Asthma: Good compliance with Singulair, Symbicort and uses albuterol as needed. Will get a flu shot at work RTC one year as long as asthma-BP are well controlled

## 2015-11-01 NOTE — Assessment & Plan Note (Signed)
prediabetes: Concept of prediabetes discuss, extensive discussion about diet HTN: Continue hydrochlorothiazide, BP today is great Asthma: Good compliance with Singulair, Symbicort and uses albuterol as needed. Will get a flu shot at work RTC one year as long as asthma-BP are well controlled

## 2015-11-01 NOTE — Assessment & Plan Note (Addendum)
Td 2013; pnm 23: 2014, prevnar 10-2014; Flu shot at work   Colonoscopy 11-2009, 3 adenomatous polyps, Cscope 12-2014, next per Dr Carlean Purl Bone density ----> 12-2008--normal  Female care: appoint pending to see  gyn MMG 02-2014 neg    diet and exercise discussed  Labs: BMP AST ALT, FLP, CBC, TSH, A1c

## 2015-11-08 LAB — HM MAMMOGRAPHY

## 2015-11-08 LAB — HM PAP SMEAR

## 2015-12-21 ENCOUNTER — Other Ambulatory Visit: Payer: Self-pay | Admitting: Internal Medicine

## 2016-01-09 ENCOUNTER — Other Ambulatory Visit: Payer: Self-pay | Admitting: Internal Medicine

## 2016-01-19 IMAGING — MR MR THORACIC SPINE WO/W CM
5 of 14 series · 17 of 48 positions shown · IV contrast (agent unspecified)
Comparison: Prior noncontrast MRI of the cervical spine performed
earlier on the same day.

CLINICAL DATA: Initial evaluation for hyper reflexia. Abnormal mass
lesion seen on prior cervical spine MRI performed earlier on the
same day.

EXAM:
MRI CERVICAL SPINE WITH CONTRAST; MRI THORACIC SPINE WITHOUT AND
WITH CONTRAST
TECHNIQUE: Multiplanar and multiecho pulse sequences of the cervical spine, to
include the craniocervical junction and cervicothoracic junction,
were obtained according to standard protocol with intravenous
contrast.; Multiplanar and multiecho pulse sequences of the thoracic
spine were obtained without and with intravenous contrast.
CONTRAST:  Unavailable.

[Series 4: T2 · sagittal · 3.0mm · 0.62mm/px · 1 of 12 slices shown (1 of 2)]
[im 1/12]
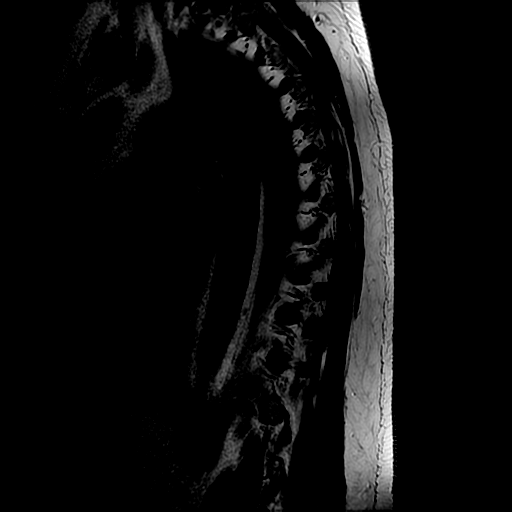

[Series 5: T1 · sagittal · 3.0mm · 0.62mm/px · 2 of 12 slices shown (1 of 2)]
[im 1/12]
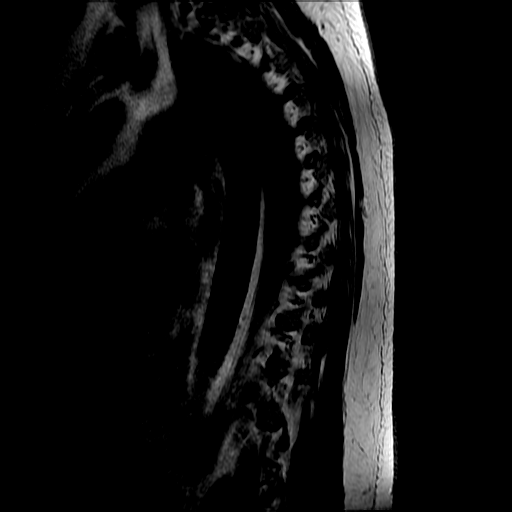
[im 12/12]
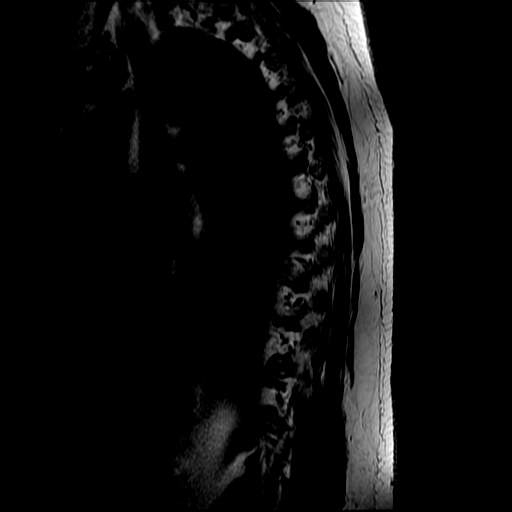

[Series 7: T2 · axial · 4.0mm · 0.43mm/px · z∈[-236,-22]mm · 6 of 46 slices shown (2 of 2)]
[im 1/46]
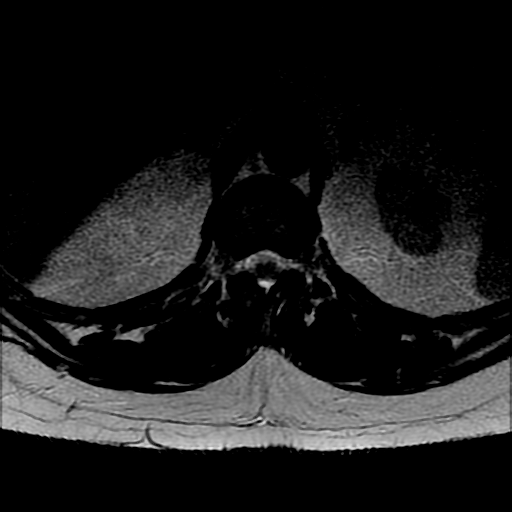
[im 10/46]
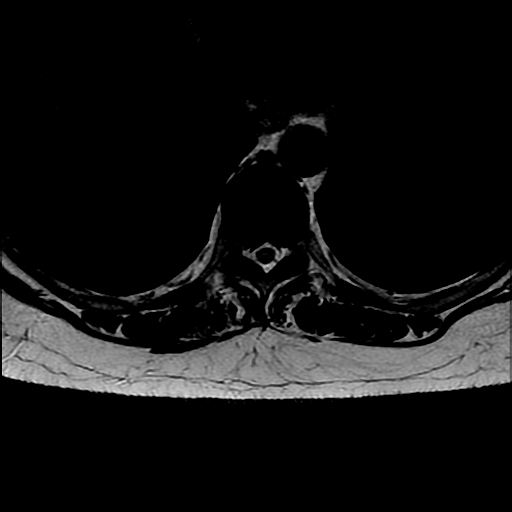
[im 19/46]
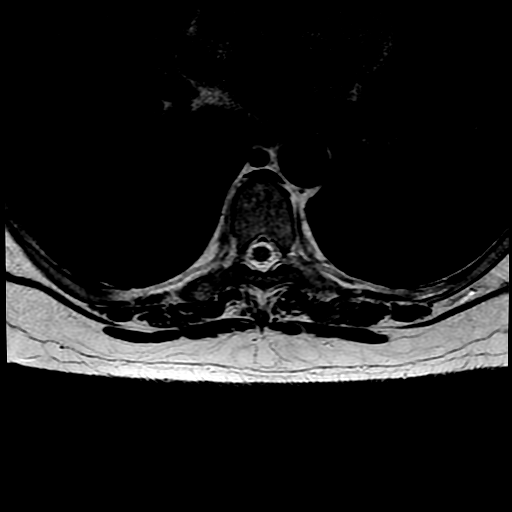
[im 28/46]
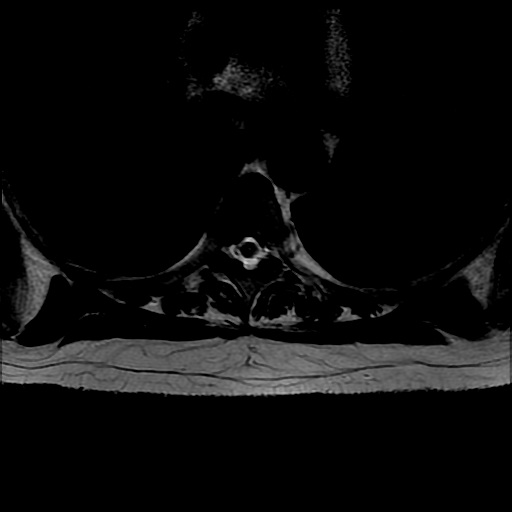
[im 37/46]
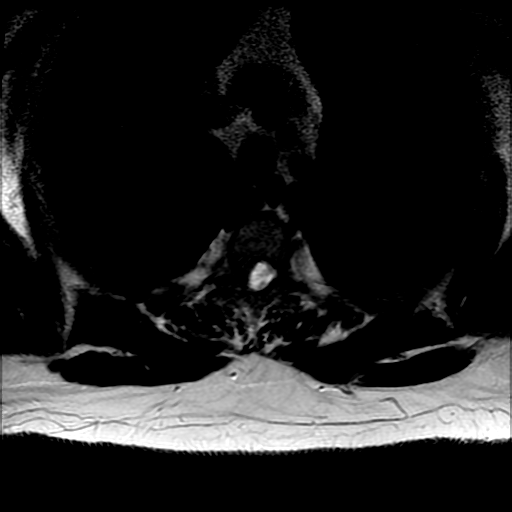
[im 46/46]
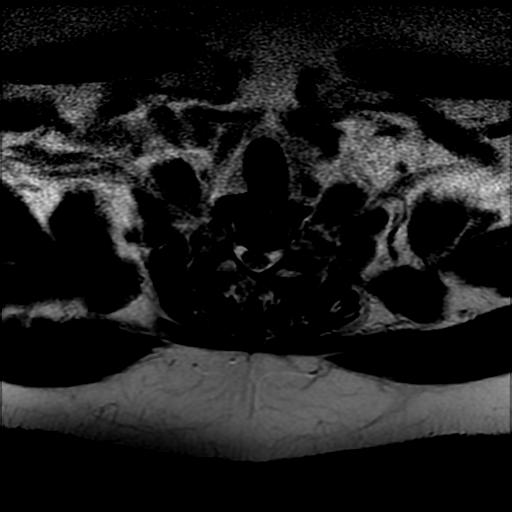

[Series 13: T1 · axial · non-contrast · 4.0mm · 0.43mm/px · z∈[-246,-29]mm · 7 of 47 slices shown (2 of 2)]
[im 1/47]
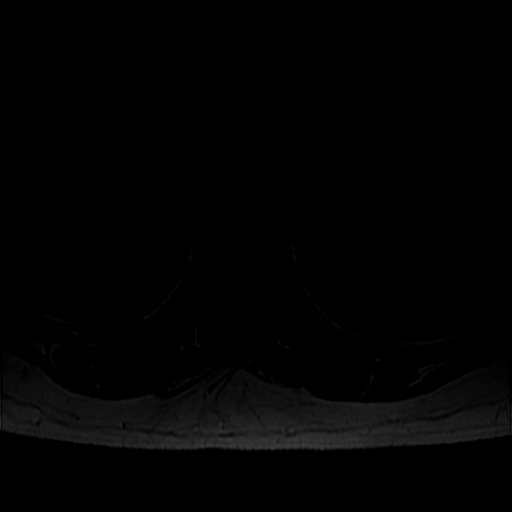
[im 8/47]
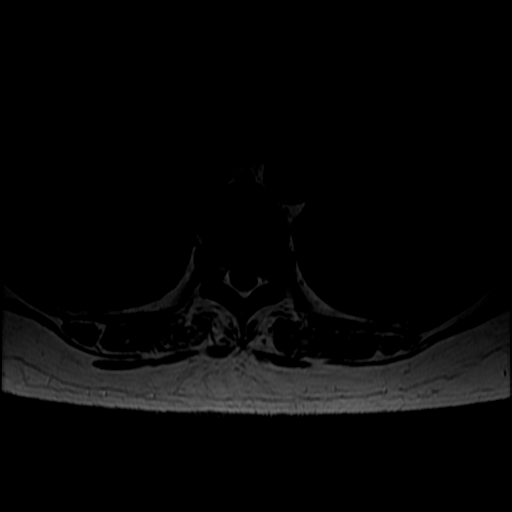
[im 16/47]
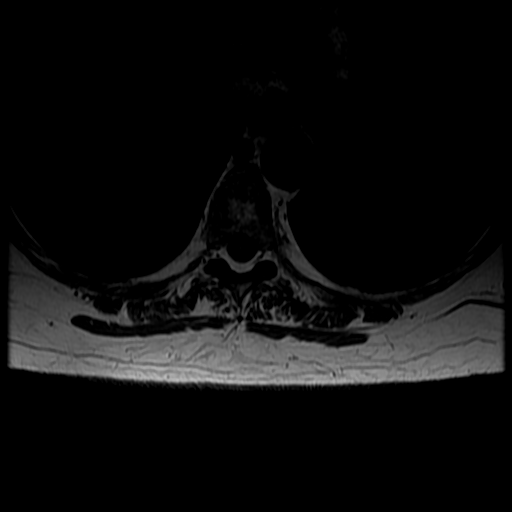
[im 24/47]
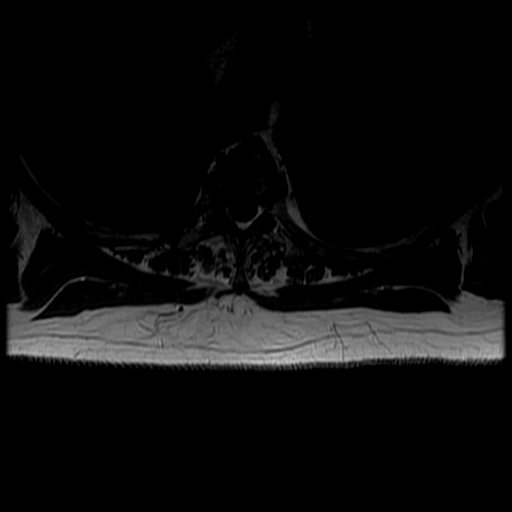
[im 31/47]
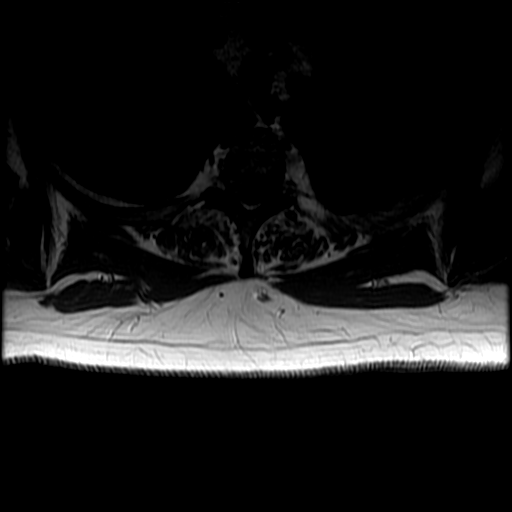
[im 39/47]
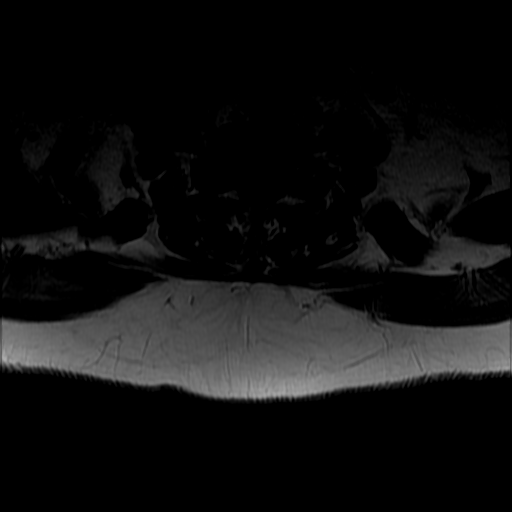
[im 47/47]
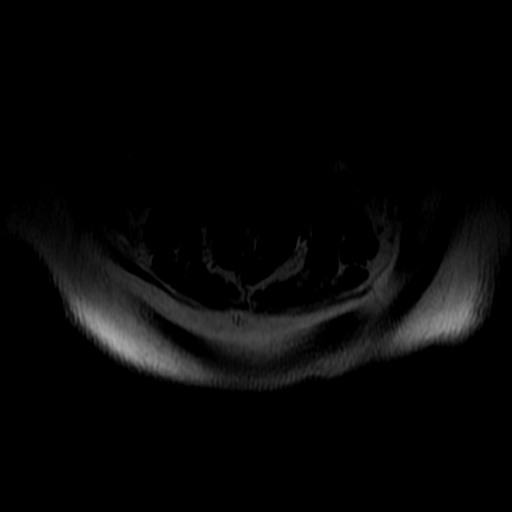

[Series 20: T1 fat-sat post-contrast · sagittal · 3.0mm · 0.43mm/px · 1 of 12 slices shown]
[im 1/12]
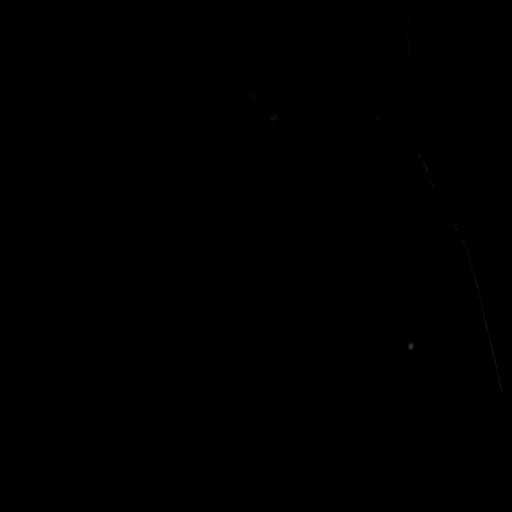

[17 of 48 positions shown; findings below may reference images not displayed]

FINDINGS: The vertebral bodies are normally aligned with preservation of the
normal cervical lordosis and thoracic kyphosis. Vertebral body
heights are preserved. Small benign hemangioma is noted within the
C3, T6, and L1 vertebral bodies. Signal intensity within the
vertebral body bone marrow is otherwise normal.

Mild multilevel in the thoracic spine. There is a central disc
protrusion at T7-8, abutting the thoracic spinal cord without
significant canal stenosis. Smaller disc protrusion is present at
T8-9 and T9-10 without significant stenosis. A small left
paracentral protrusion present at T10-11 without significant
stenosis.

Previously lesion within the left aspect of the thecal sac again
seen extending from the C7-T1 level through the T2-3 level. Again,
the location of this abnormality is favored to be intradural and
extramedullary in location given its position within the thecal sac
an relative compression and association with the adjacent spinal
cord. The spinal cord is severely compressed to the right at the
level of the mass, measuring approximately in 5 mm and transverse
diameter. This lesion measures 4.3 x 1.4 x 1.0 cm (craniocaudad x AP
x transverse). This lesion is isointense to the adjacent spinal cord
on precontrast T1 weighted imaging, with slightly hyperintense
T2/STIR signal intensity, with fairly avid post-contrast
enhancement. This lesion does not appear to definitely restrict on
diffusion-weighted sequences. Finding is most consistent with a mass
lesion, and is not compatible with a benign cystic lesion given the
post-contrast enhancement. Again, this lesion is indeterminate, and
would be somewhat unusual for a schwannoma given the lack of
foraminal involvement, and also unusual for a meningioma given its
fairly oblong and fusiform appearance. Metastatic disease is not
favored given lack of additional abnormalities within the
cervicothoracic spine. Again, there is question of subtle cord edema
within the upper thoracic spine.

No other abnormal enhancement identified within the cervical or
thoracic spine.

Paraspinous soft tissues are unremarkable.
IMPRESSION: 1. Intradural, extramedullary enhancing mass within the left spinal
canal extending from C7-T1 through T2-3 with secondary severe cord
compression. This lesion demonstrates avid post-contrast
enhancement, and is most compatible with a solid mass. Please see
above discussion for full description of these findings.
2. Small degenerative disc bulges at C6-7, T7-8, T8-9, T9-10, and
T10-11.

## 2016-01-19 IMAGING — MR MR CERVICAL SPINE W/O CM
7 of 9 series · 35 of 48 positions shown · non-contrast
Comparison: None.

CLINICAL DATA: Hyper reflexia.

EXAM:
MRI CERVICAL SPINE WITHOUT CONTRAST
TECHNIQUE: Multiplanar, multisequence MR imaging of the cervical spine was
performed. No intravenous contrast was administered.

[Series 3: T2 · sagittal · 3.0mm · 0.66mm/px · 4 of 12 slices shown (1 of 5)]
[im 1/12]
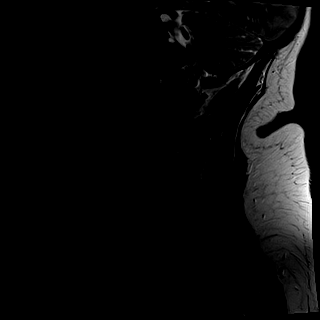
[im 4/12]
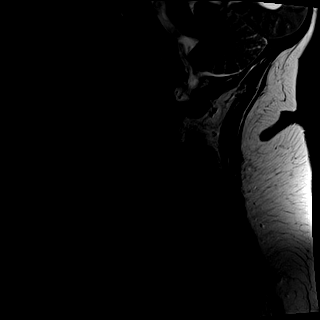
[im 8/12]
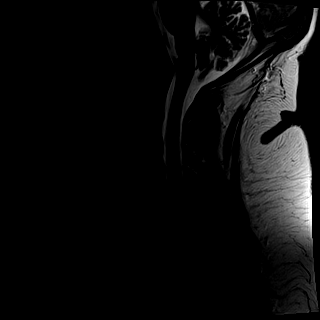
[im 12/12]
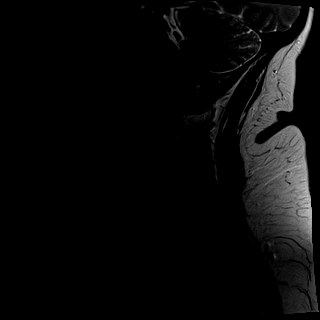

[Series 4: T1 · sagittal · 3.0mm · 0.41mm/px · 3 of 12 slices shown (1 of 2)]
[im 1/12]
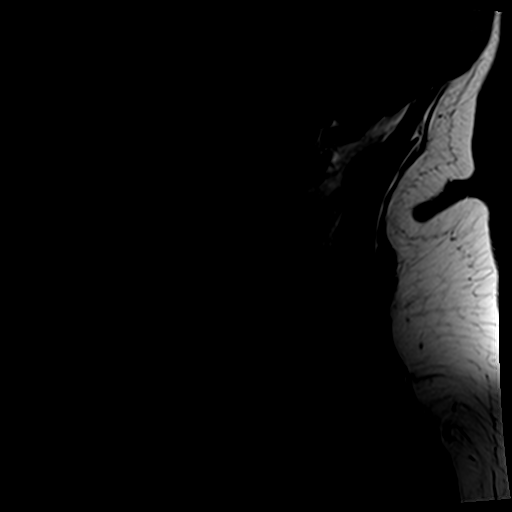
[im 6/12]
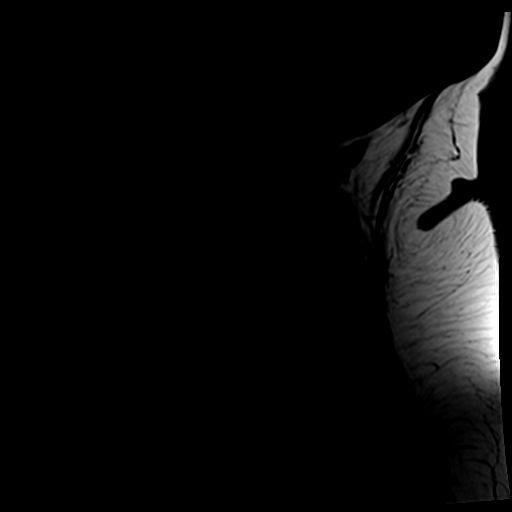
[im 12/12]
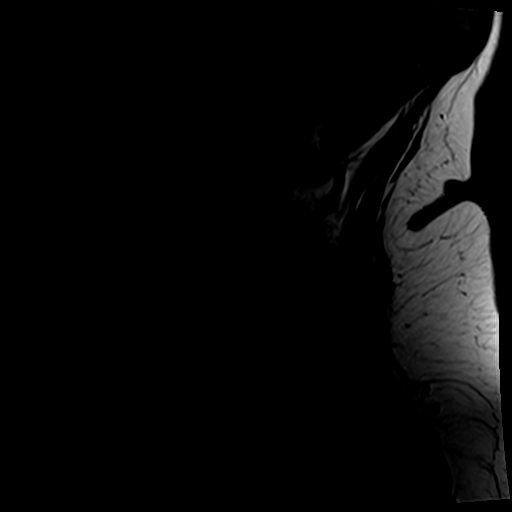

[Series 8: T2 · axial · 3.0mm · 0.70mm/px · z∈[-101,+41]mm · 9 of 40 slices shown (2 of 5)]
[im 1/40]
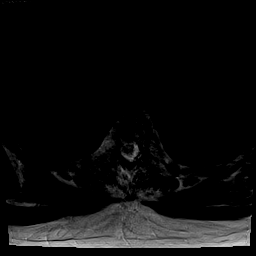
[im 5/40]
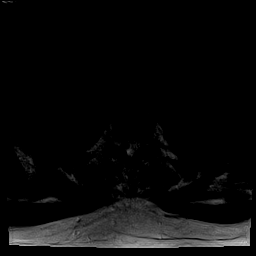
[im 10/40]
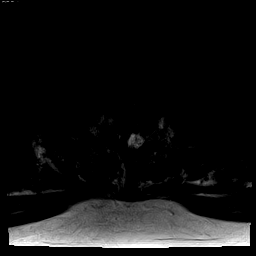
[im 15/40]
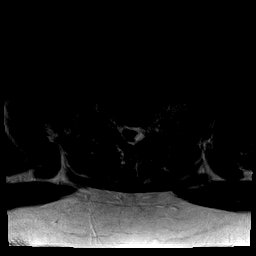
[im 20/40]
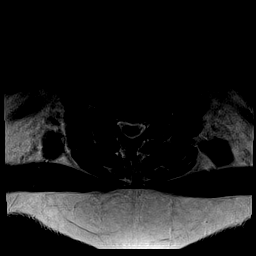
[im 25/40]
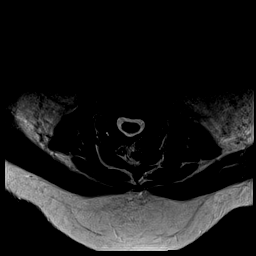
[im 30/40]
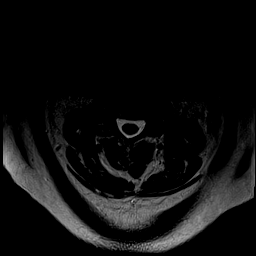
[im 35/40]
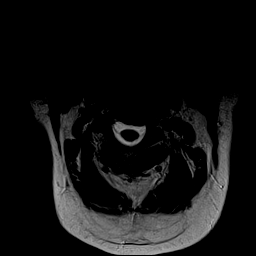
[im 40/40]
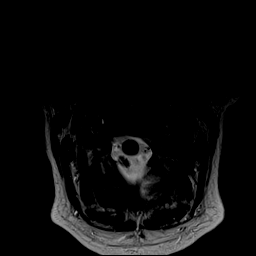

[Series 10: T2 · sagittal · 3.0mm · 0.66mm/px · 3 of 12 slices shown (3 of 5)]
[im 1/12]
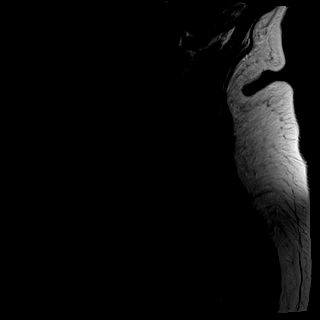
[im 6/12]
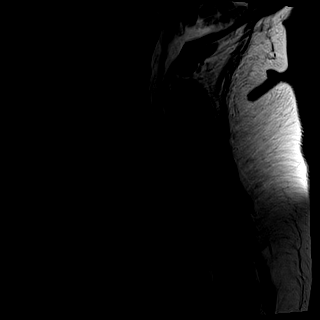
[im 12/12]
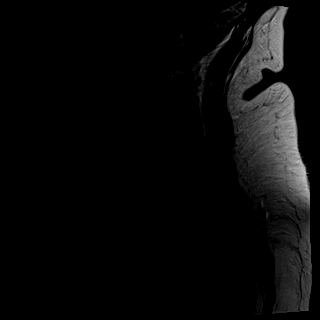

[Series 11: T1 · axial · 3.0mm · 0.35mm/px · z∈[-114,-23]mm · 6 of 32 slices shown (2 of 2)]
[im 1/32]
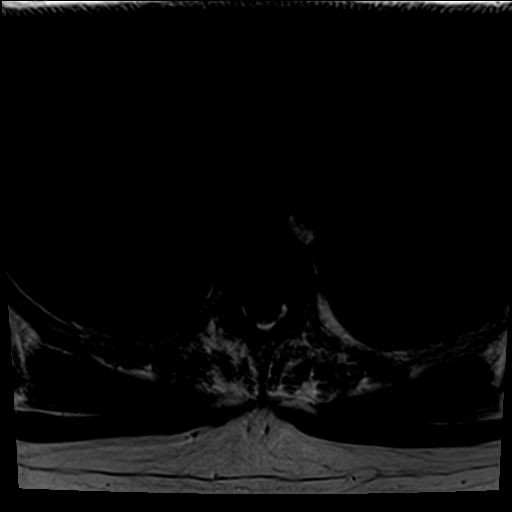
[im 6/32]
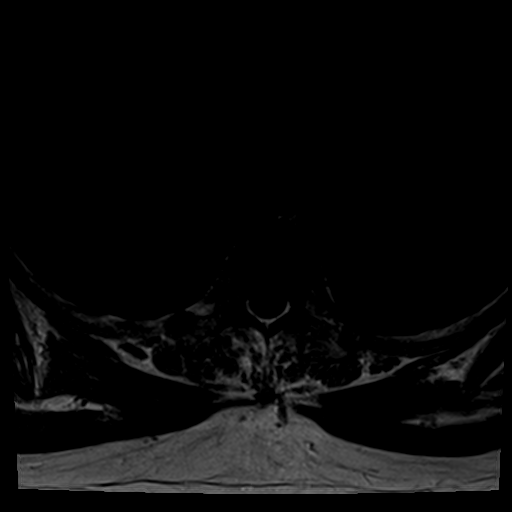
[im 11/32]
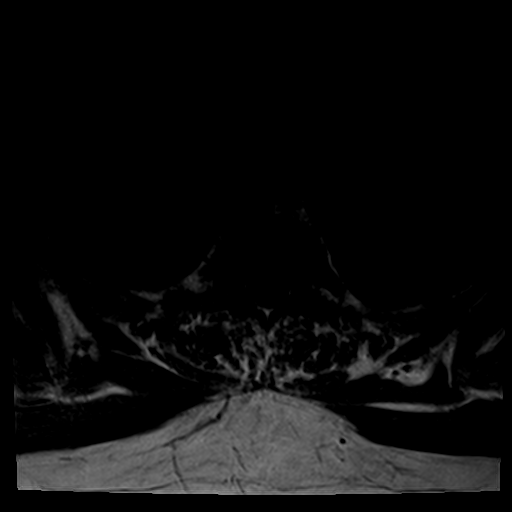
[im 16/32]
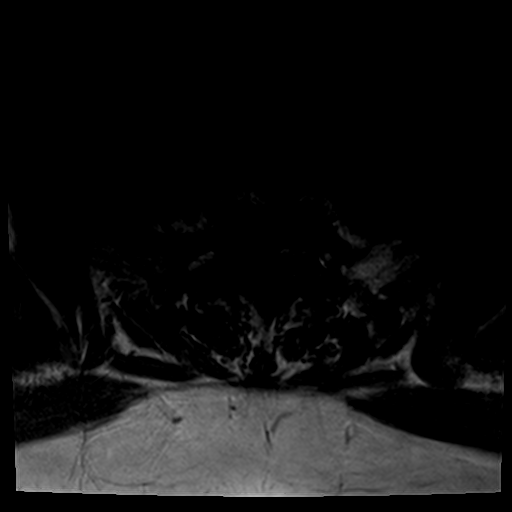
[im 21/32]
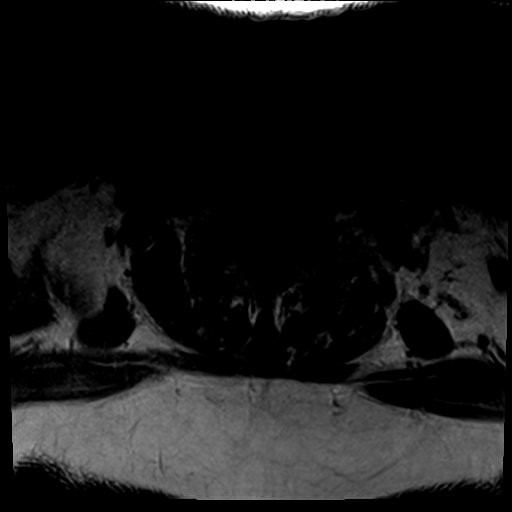
[im 26/32]
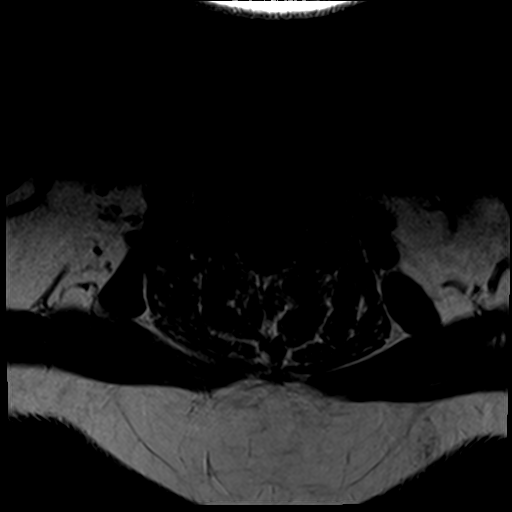

[Series 12: T2 · coronal · 3.0mm · 0.66mm/px · 3 of 14 slices shown (4 of 5)]
[im 1/14]
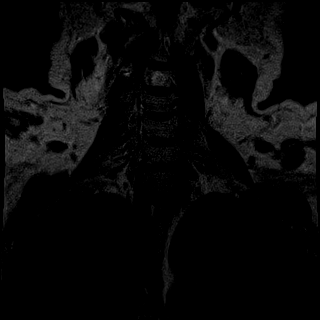
[im 7/14]
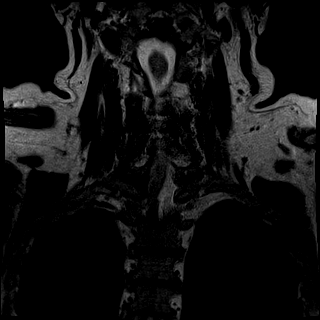
[im 14/14]
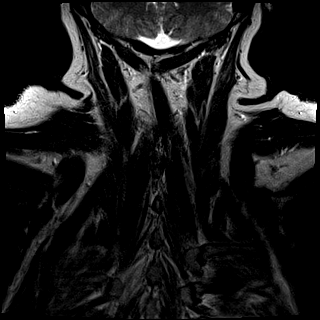

[Series 13: T2 · axial · 3.0mm · 0.70mm/px · z∈[-114,-1]mm · 7 of 32 slices shown (5 of 5)]
[im 1/32]
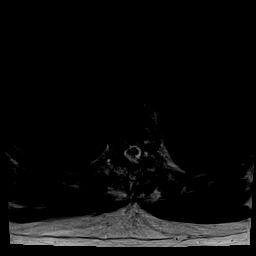
[im 6/32]
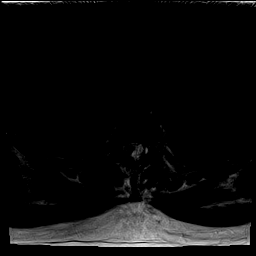
[im 11/32]
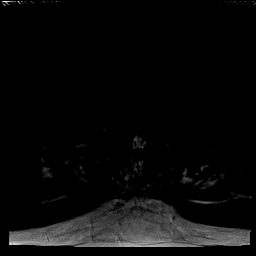
[im 16/32]
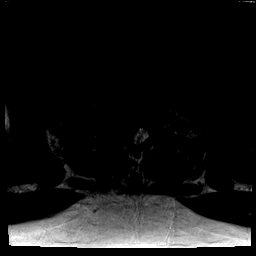
[im 21/32]
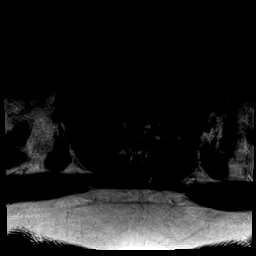
[im 26/32]
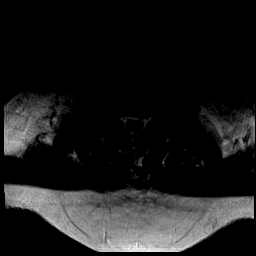
[im 32/32]
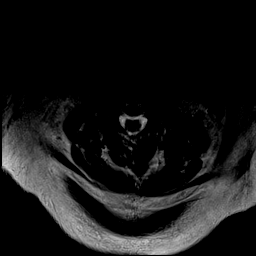

[35 of 48 positions shown; findings below may reference images not displayed]

FINDINGS: Abnormality within the left spinal canal with extending from the
C7-T1 disc to the T2-T3 disc. The discrete signal abnormality has
the appearance of a mass lesion, with severe compression of the
spinal cord to the right. The abnormality is T2 hyperintense and T1
hypo intense with amorphous T2 hypo intensity centrally. Thecal sac
positioning and interface with the cord suggests a intradural,
extramedullary mass lesion. The lesion is indeterminate ;
orientation and lack of foraminal narrowing argues against a
schwannoma, signal intensity and fusiform shape would be unusual for
meningioma, internal T2 hypointensity would be unusual for an
uncomplicated meningeal cyst (although this raises the possibility
of a rare epidermoid cyst). No segmentation anomaly or continuation
to the posterior mediastinum. Suspect T2 hyperintensity within the
spinal cord below the mass lesion. Gadolinium could not be
administered due to lack of physician presence during scanning,
diffusion imaging not available on this scanner. These results were
called by telephone at the time of interpretation on 12/20/2013 at
approximately 8 pm to Dr. Miroglu , who verbally acknowledged these
results. The patient was referred to [REDACTED].

No marrow signal abnormality suggestive of fracture, infection, or
neoplasm. There is a small hemangioma within the C3 vertebral body.

Central disc protrusion at C6-7, contacting the ventral cord. There
is a smaller central disc herniation at C5-6, without cord contact.
IMPRESSION: 1. Mass (intrathecal, extramedullary) in the left spinal canal at
the level of T1 and T2, with severe spinal cord compression. The
lesion is indeterminate, as discussed above. Postcontrast imaging is
recommended to differentiate between cystic and solid masses.
2. Possible upper thoracic spinal cord edema.

## 2016-02-18 ENCOUNTER — Other Ambulatory Visit: Payer: Self-pay | Admitting: Internal Medicine

## 2016-03-20 ENCOUNTER — Other Ambulatory Visit: Payer: Self-pay | Admitting: Internal Medicine

## 2016-08-22 ENCOUNTER — Other Ambulatory Visit: Payer: Self-pay | Admitting: Internal Medicine

## 2016-10-21 ENCOUNTER — Other Ambulatory Visit: Payer: Self-pay | Admitting: Internal Medicine

## 2016-11-02 ENCOUNTER — Encounter: Payer: Self-pay | Admitting: Internal Medicine

## 2016-11-02 ENCOUNTER — Ambulatory Visit (INDEPENDENT_AMBULATORY_CARE_PROVIDER_SITE_OTHER): Payer: BLUE CROSS/BLUE SHIELD | Admitting: Internal Medicine

## 2016-11-02 VITALS — BP 132/80 | HR 75 | Temp 98.2°F | Resp 14 | Ht 63.0 in | Wt 217.0 lb

## 2016-11-02 DIAGNOSIS — Z0000 Encounter for general adult medical examination without abnormal findings: Secondary | ICD-10-CM | POA: Diagnosis not present

## 2016-11-02 DIAGNOSIS — Z23 Encounter for immunization: Secondary | ICD-10-CM | POA: Diagnosis not present

## 2016-11-02 LAB — CBC WITH DIFFERENTIAL/PLATELET
BASOS PCT: 0.8 % (ref 0.0–3.0)
Basophils Absolute: 0 10*3/uL (ref 0.0–0.1)
EOS PCT: 3.1 % (ref 0.0–5.0)
Eosinophils Absolute: 0.2 10*3/uL (ref 0.0–0.7)
HEMATOCRIT: 41 % (ref 36.0–46.0)
HEMOGLOBIN: 13.3 g/dL (ref 12.0–15.0)
Lymphocytes Relative: 28.1 % (ref 12.0–46.0)
Lymphs Abs: 1.5 10*3/uL (ref 0.7–4.0)
MCHC: 32.5 g/dL (ref 30.0–36.0)
MCV: 89 fl (ref 78.0–100.0)
MONO ABS: 0.5 10*3/uL (ref 0.1–1.0)
MONOS PCT: 10 % (ref 3.0–12.0)
Neutro Abs: 3.2 10*3/uL (ref 1.4–7.7)
Neutrophils Relative %: 58 % (ref 43.0–77.0)
Platelets: 311 10*3/uL (ref 150.0–400.0)
RBC: 4.61 Mil/uL (ref 3.87–5.11)
RDW: 13.2 % (ref 11.5–15.5)
WBC: 5.5 10*3/uL (ref 4.0–10.5)

## 2016-11-02 LAB — COMPREHENSIVE METABOLIC PANEL
ALBUMIN: 4.2 g/dL (ref 3.5–5.2)
ALK PHOS: 66 U/L (ref 39–117)
ALT: 23 U/L (ref 0–35)
AST: 19 U/L (ref 0–37)
BILIRUBIN TOTAL: 0.3 mg/dL (ref 0.2–1.2)
BUN: 20 mg/dL (ref 6–23)
CO2: 32 mEq/L (ref 19–32)
Calcium: 9.5 mg/dL (ref 8.4–10.5)
Chloride: 103 mEq/L (ref 96–112)
Creatinine, Ser: 0.79 mg/dL (ref 0.40–1.20)
GFR: 78.72 mL/min (ref >60.00)
Glucose, Bld: 118 mg/dL — ABNORMAL HIGH (ref 70–99)
POTASSIUM: 4.8 meq/L (ref 3.5–5.1)
SODIUM: 140 meq/L (ref 135–145)
TOTAL PROTEIN: 6.7 g/dL (ref 6.0–8.3)

## 2016-11-02 LAB — LIPID PANEL
CHOLESTEROL: 207 mg/dL — AB (ref 0–200)
HDL: 49.7 mg/dL (ref >39.00)
LDL Cholesterol: 135 mg/dL — ABNORMAL HIGH (ref 0–99)
NonHDL: 157.6
Total CHOL/HDL Ratio: 4
Triglycerides: 112 mg/dL (ref 0.0–149.0)
VLDL: 22.4 mg/dL (ref 0.0–40.0)

## 2016-11-02 LAB — HEMOGLOBIN A1C: HEMOGLOBIN A1C: 5.9 % (ref 4.6–6.5)

## 2016-11-02 MED ORDER — ZOSTER VAC RECOMB ADJUVANTED 50 MCG/0.5ML IM SUSR: 0.5000 mL | Freq: Once | INTRAMUSCULAR | 1 refills | Status: AC

## 2016-11-02 MED ORDER — ZOSTER VAC RECOMB ADJUVANTED 50 MCG/0.5ML IM SUSR: 0.5000 mL | Freq: Once | INTRAMUSCULAR | 1 refills | Status: DC

## 2016-11-02 NOTE — Progress Notes (Signed)
Pre visit review using our clinic review tool, if applicable. No additional management support is needed unless otherwise documented below in the visit note. 

## 2016-11-02 NOTE — Patient Instructions (Signed)
GO TO THE LAB : Get the blood work     GO TO THE FRONT DESK Schedule your next appointment for a  physical exam in one year  

## 2016-11-02 NOTE — Assessment & Plan Note (Signed)
-  Td 2013; pnm 23: 2014, prevnar 10-2014; Flu shot today; printed shingrix RX -Colonoscopy 11-2009, 3 adenomatous polyps, Cscope 12-2014, next 5 years per report, Dr Carlean Purl -female care:  per gyn, has an appointment in October 2018 Bone density ----> 12-2008--normal ; offered a DEXA today, will let me know if interested MMG 02-2014, had a MMG at gyn 2017 per pt, no  records  -diet and exercise discussed  -Labs:  CMP, FLP, CBC, A1c

## 2016-11-02 NOTE — Progress Notes (Signed)
Subjective:    Patient ID: Vanessa Middleton, female    DOB: [birth year], 61 y.o.   MRN: [id number]  DOS:  11/02/2016 Type of visit - description : cpx Interval history: Feeling great, since the last time I saw her a year ago, she is not working, she moved to [place] (still getting medical care in [place]), stress has decrease, she takes a walk daily. Trying to eat healthier. Had no acute respiratory illnesses   Review of Systems  A 14 point review of systems is negative    Past Medical History:  Diagnosis Date  . Anxiety   . Anxiety and depression   . Anxiety and depression 10-2011  . Asthma    "breathing is a little tighter today"- 01/11/2014  . Complication of anesthesia   . Depression   . Fatty liver    per [place] 9/11  . GERD (gastroesophageal reflux disease)    EGD 10/11  . H/O pulmonary function tests ?2000   ordered by Dr. Larose Kells, pt. reports thwe results were wnl.   . Hypertension   . Personal history of colonic polyps 11/23/2007  . Pneumonia   . PONV (postoperative nausea and vomiting)   . Shortness of breath dyspnea    /w exertion     Past Surgical History:  Procedure Laterality Date  . APPENDECTOMY  01/1997  . BREAST BIOPSY Right 07/2007   neg  . CESAREAN SECTION  03/1996  . LAMINECTOMY N/A 01/14/2014   Procedure: CERVICAL LAMINECTOMY FOR TUMOR. 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 PILONIDAL CYST EXCISION    . TONSILLECTOMY      Social History   Social History  . Marital status: Divorced    Spouse name: N/A  . Number of children: 1   . Years of education: N/A   Occupational History  . not working at present    Social History Main Topics  . Smoking status: Former Smoker    Quit date: 02/13/1996  . Smokeless tobacco: Never Used     Comment: used to smoke 2 ppd  . Alcohol use 3.0 oz/week    5 Standard drinks or equivalent per week     Comment: 5x/week  . Drug use: No  . Sexual activity: Not on file   Other Topics Concern  . Not on file   Social History Narrative   Son at [employer])   Moved to [place], has a sister in [place]     Family History  Problem Relation Age of Onset  . Melanoma Father   . Hypertension Mother   . Diabetes Other        cousin  . Coronary artery disease Neg Hx   . Stroke Neg Hx   . Colon cancer Neg Hx   . Breast cancer Neg Hx      Allergies as of 11/02/2016   No Known Allergies     Medication List       Accurate as of 11/02/16 11:59 PM. Always use your most recent med list.          albuterol 108 (90 Base) MCG/ACT inhaler Commonly known as:  VENTOLIN HFA Inhale 2 puffs into the lungs every 6 (six) hours as needed for wheezing or shortness of breath.   budesonide-formoterol 160-4.5 MCG/ACT inhaler Commonly known as:  SYMBICORT Inhale 2 puffs into the lungs daily.  cholecalciferol 1000 units tablet Commonly known as:  VITAMIN D Take 2,000 Units by mouth daily.   fluticasone 50 MCG/ACT nasal spray Commonly known as:  FLONASE Place 2 sprays into both nostrils daily.   hydrochlorothiazide 25 MG tablet Commonly known as:  HYDRODIURIL Take 1 tablet (25 mg total) by mouth daily.   loratadine 10 MG tablet Commonly known as:  CLARITIN Take 10 mg by mouth daily.   montelukast 10 MG tablet Commonly known as:  SINGULAIR Take 1 tablet (10 mg total) by mouth at bedtime.   omeprazole 40 MG capsule Commonly known as:  PRILOSEC Take 1 capsule (40 mg total) by mouth daily before breakfast.   PRISTIQ 50 MG 24 hr tablet Generic drug:  desvenlafaxine Take 1 tablet by mouth daily.   ranitidine 300 MG tablet Commonly known as:  ZANTAC Take 1 tablet (300 mg total) by mouth at bedtime.   Zoster Vac Recomb Adjuvanted injection Commonly known as:  SHINGRIX Inject 0.5 mLs into the muscle once.            Discharge Care Instructions        Start     Ordered    11/02/16 0000  Comp Met (CMET)     11/02/16 0951   11/02/16 0000  Lipid panel     11/02/16 0951   11/02/16 0000  CBC w/Diff     11/02/16 0951   11/02/16 0000  Hemoglobin A1c     11/02/16 0951   11/02/16 0000  Flu Vaccine QUAD 6+ mos PF IM (Fluarix Quad PF)     11/02/16 0951   11/02/16 0000  Zoster Vac Recomb Adjuvanted [facility]) injection   Once     11/02/16 0952         Objective:   Physical Exam BP 132/80 (BP Location: Left Arm, Patient Position: Sitting, Cuff Size: Small)   Pulse 75   Temp 98.2 F (36.8 C) (Oral)   Resp 14   Ht '5\' 3"'$  (1.6 m)   Wt 217 lb (98.4 kg)   LMP 05/01/2010   SpO2 97%   BMI 38.44 kg/m   General:   Well developed, well nourished . NAD.  Neck: No  thyromegaly  HEENT:  Normocephalic . Face symmetric, atraumatic Lungs:  CTA B Normal respiratory effort, no intercostal retractions, no accessory muscle use. Heart: RRR,  no murmur.  No pretibial edema bilaterally  Abdomen:  Not distended, soft, non-tender. No rebound or rigidity.   Skin: Exposed areas without rash. Not pale. Not jaundice Neurologic:  alert & oriented X3.  Speech normal, gait appropriate for age and unassisted Strength symmetric and appropriate for age.  Psych: Cognition and judgment appear intact.  Cooperative with normal attention span and concentration.  Behavior appropriate. No anxious or depressed appearing.    Assessment & Plan:   Assessment HTN Pre-diabetes  Anxiety depression sees [facility] CXR RML nodule, CXR 2015  stable since 2012  GERD EGD 2011. H/o dysphagia  Fatty liver: ultrasound 10-2009 Gait disorder, hyperreflexia neurology eval 12-2013 , referred for a  Cspine laminectomy 01-2014 , released from surgery ~ [id number] Lichenoid keratosis  Dx 2014 Dr Delman Cheadle  H/o remote R eye: h/o histoplasmosis   PLAN: HTN, prediabetes, anxiety, asthma, GERD: Chronic medical problems seem to be well-controlled, hardly ever uses her rescue inhalers, good  compliance with medications. We are doing general labs, will call for refills when needed. RTC one year [place]

## 2016-11-03 NOTE — Assessment & Plan Note (Signed)
HTN, prediabetes, anxiety, asthma, GERD: Chronic medical problems seem to be well-controlled, hardly ever uses her rescue inhalers, good compliance with medications. We are doing general labs, will call for refills when needed. RTC one year [place]

## 2016-12-17 ENCOUNTER — Other Ambulatory Visit: Payer: Self-pay | Admitting: Internal Medicine

## 2017-02-18 ENCOUNTER — Other Ambulatory Visit: Payer: Self-pay | Admitting: Internal Medicine

## 2017-05-08 ENCOUNTER — Telehealth: Payer: Self-pay | Admitting: *Deleted

## 2017-05-08 NOTE — Telephone Encounter (Signed)
Received Student Medical Form from patient for [facility] via mail; no records in [place] for (3)  DTP or Td, forwarded to provider with notes/SLS 03/27

## 2017-05-17 ENCOUNTER — Telehealth: Payer: Self-pay | Admitting: *Deleted

## 2017-05-17 NOTE — Telephone Encounter (Signed)
Patient called today about paperwork; found still in provider's red folder, incomplete. Called patient and explained she had no records in [place], and a couple of other questions on form that provider wanted her to come in to office for visit to go over. She stated that she couldn't come in because of where she lived. She was agreeable to go to [facility] in her county and have titers drawn that are requiredand just asked if we would document what she had here [place] on paperwork] and put the date of her physical, also, wite that her co-morbidities [ie Obesity, Asthma] would not limit her. Gave to [facility] with notes/SLS 04/05

## 2017-07-23 NOTE — Telephone Encounter (Signed)
ERROR

## 2017-08-09 LAB — HM MAMMOGRAPHY

## 2017-08-27 ENCOUNTER — Encounter: Payer: Self-pay | Admitting: Internal Medicine

## 2017-09-06 ENCOUNTER — Telehealth: Payer: Self-pay | Admitting: Internal Medicine

## 2017-09-06 NOTE — Telephone Encounter (Signed)
Spoke w/ Pt- [place] [facility])- she is doing well w/o wheezing, shortness of breath but currently using Symbicort- I informed her that I was unsure we could find a cheaper inhaler- however I would send to covering provider (PCP out of office) to see. Pt verbalized understanding.

## 2017-09-06 NOTE — Telephone Encounter (Signed)
Please advise 

## 2017-09-06 NOTE — Telephone Encounter (Signed)
Any inhaler will likely cost a lot with no insurance.  Particularly the combination spray such as Symbicort are more costly.  How is patient doing presently?.  If she is short of breath or wheezing?.  If she is stable could you call 1 of the pulmonologist office speak to one of nurses and see if they can give patient sample symbicort?  All the pulmonologist still coming to the med center?   I think whenever I try is going to be expensive. Can you update me today?

## 2017-09-06 NOTE — Telephone Encounter (Signed)
Copied from [place] [id number]. Topic: General - Other >> Sep 06, 2017 12:30 PM Gardiner Ramus wrote: Reason for CRM: budesonide-formoterol (SYMBICORT) 160-4.5 MCG/ACT inhaler pt does not have insurance and the medication is costing her $250 and would like to know if there is any thing cheaper that can be called in. Please advise.

## 2017-09-08 MED ORDER — FLUTICASONE-SALMETEROL 250-50 MCG/DOSE IN AEPB: 1.0000 | INHALATION_SPRAY | Freq: Two times a day (BID) | RESPIRATORY_TRACT | 2 refills | Status: AC

## 2017-09-08 NOTE — Telephone Encounter (Signed)
When I read message I thought she was vacationing near [place]. So I went ahead and sent prescription of advair to her pharmacy. Will see if this is covered.

## 2017-11-08 ENCOUNTER — Ambulatory Visit (INDEPENDENT_AMBULATORY_CARE_PROVIDER_SITE_OTHER): Payer: Self-pay | Admitting: Internal Medicine

## 2017-11-08 ENCOUNTER — Telehealth: Payer: Self-pay

## 2017-11-08 ENCOUNTER — Encounter: Payer: Self-pay | Admitting: Internal Medicine

## 2017-11-08 VITALS — BP 152/90 | HR 77 | Temp 98.7°F | Resp 16 | Ht 63.0 in | Wt 219.5 lb

## 2017-11-08 DIAGNOSIS — R7303 Prediabetes: Secondary | ICD-10-CM

## 2017-11-08 DIAGNOSIS — Z23 Encounter for immunization: Secondary | ICD-10-CM

## 2017-11-08 DIAGNOSIS — Z0000 Encounter for general adult medical examination without abnormal findings: Secondary | ICD-10-CM

## 2017-11-08 LAB — COMPREHENSIVE METABOLIC PANEL
ALBUMIN: 4.3 g/dL (ref 3.5–5.2)
ALK PHOS: 85 U/L (ref 39–117)
ALT: 30 U/L (ref 0–35)
AST: 21 U/L (ref 0–37)
BILIRUBIN TOTAL: 0.4 mg/dL (ref 0.2–1.2)
BUN: 17 mg/dL (ref 6–23)
CO2: 32 mEq/L (ref 19–32)
CREATININE: 0.83 mg/dL (ref 0.40–1.20)
Calcium: 9.8 mg/dL (ref 8.4–10.5)
Chloride: 103 mEq/L (ref 96–112)
GFR: 74.11 mL/min (ref >60.00)
Glucose, Bld: 140 mg/dL — ABNORMAL HIGH (ref 70–99)
Potassium: 4.9 mEq/L (ref 3.5–5.1)
Sodium: 142 mEq/L (ref 135–145)
TOTAL PROTEIN: 6.7 g/dL (ref 6.0–8.3)

## 2017-11-08 LAB — LIPID PANEL
CHOLESTEROL: 207 mg/dL — AB (ref 0–200)
HDL: 45.6 mg/dL (ref >39.00)
LDL CALC: 131 mg/dL — AB (ref 0–99)
NONHDL: 161.67
Total CHOL/HDL Ratio: 5
Triglycerides: 151 mg/dL — ABNORMAL HIGH (ref 0.0–149.0)
VLDL: 30.2 mg/dL (ref 0.0–40.0)

## 2017-11-08 LAB — HEMOGLOBIN A1C: HEMOGLOBIN A1C: 6.4 % (ref 4.6–6.5)

## 2017-11-08 MED ORDER — ZOSTER VAC RECOMB ADJUVANTED 50 MCG/0.5ML IM SUSR: 0.5000 mL | Freq: Once | INTRAMUSCULAR | 1 refills | Status: AC

## 2017-11-08 MED ORDER — LOSARTAN POTASSIUM 100 MG PO TABS: 100.0000 mg | ORAL_TABLET | Freq: Every day | ORAL | 1 refills | Status: DC

## 2017-11-08 NOTE — Progress Notes (Signed)
Subjective:    Patient ID: Vanessa Middleton, female    DOB: 1955-03-07, 62 y.o.   MRN: [id number]  DOS:  11/08/2017 Type of visit - description : cpx Interval history: Since the last visit, she moved to the [place] but is here for her CPX.  She was noted to have a elevated BP up to 160/90, at a local MD, was  prescribed losartan for a month, 50 mg.  She took it without apparent side effects, BP is decreased a little but never went to "normal" according to the patient, no actual readings available.  Review of Systems  Other than above, a 14 point review of systems is negative    Past Medical History:  Diagnosis Date  . Anxiety   . Anxiety and depression   . Anxiety and depression 10-2011  . Asthma    "breathing is a little tighter today"- 01/11/2014  . Complication of anesthesia   . Depression   . Fatty liver    per [place] 9/11  . GERD (gastroesophageal reflux disease)    EGD 10/11  . H/O pulmonary function tests ?2000   ordered by Dr. Larose Kells, pt. reports thwe results were wnl.   . Hypertension   . Personal history of colonic polyps 11/23/2007  . Pneumonia   . PONV (postoperative nausea and vomiting)   . Shortness of breath dyspnea    /w exertion     Past Surgical History:  Procedure Laterality Date  . APPENDECTOMY  01/1997  . BREAST BIOPSY Right 07/2007   neg  . CESAREAN SECTION  03/1996  . LAMINECTOMY N/A 01/14/2014   Procedure: CERVICAL LAMINECTOMY FOR TUMOR. 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 PILONIDAL CYST EXCISION    . TONSILLECTOMY      Social History   Socioeconomic History  . Marital status: Divorced    Spouse name: Not on file  . Number of children: 1   . Years of education: Not on file  . Highest education level: Not on file  Occupational History  . Occupation: not working at present  Social Needs  . Financial resource strain: Not on file    . Food insecurity:    Worry: Not on file    Inability: Not on file  . Transportation needs:    Medical: Not on file    Non-medical: Not on file  Tobacco Use  . Smoking status: Former Smoker    Last attempt to quit: 02/13/1996    Years since quitting: 21.7  . Smokeless tobacco: Never Used  . Tobacco comment: used to smoke 2 ppd  Substance and Sexual Activity  . Alcohol use: Yes    Alcohol/week: 5.0 standard drinks    Types: 5 Standard drinks or equivalent per week    Comment: 5x/week  . Drug use: No  . Sexual activity: Not on file  Lifestyle  . Physical activity:    Days per week: Not on file    Minutes per session: Not on file  . Stress: Not on file  Relationships  . Social connections:    Talks on phone: Not on file    Gets together: Not on file    Attends religious service: Not on file    Active member of club or organization: Not on file    Attends meetings of clubs or organizations: Not on file    Relationship status:  Not on file  . Intimate partner violence:    Fear of current or ex partner: Not on file    Emotionally abused: Not on file    Physically abused: Not on file    Forced sexual activity: Not on file  Other Topics Concern  . Not on file  Social History Narrative   Son at [employer])   Lives in the [place]; has a sister in [place]     Family History  Problem Relation Age of Onset  . Melanoma Father   . Hypertension Mother   . Diabetes Other        cousin  . Coronary artery disease Neg Hx   . Stroke Neg Hx   . Colon cancer Neg Hx   . Breast cancer Neg Hx      Allergies as of 11/08/2017   No Known Allergies     Medication List        Accurate as of 11/08/17 11:59 PM. Always use your most recent med list.          albuterol 108 (90 Base) MCG/ACT inhaler Commonly known as:  PROVENTIL HFA;VENTOLIN HFA Inhale 2 puffs into the lungs every 6 (six) hours as needed for wheezing or shortness of breath.   cholecalciferol 1000 units tablet Commonly  known as:  VITAMIN D Take 2,000 Units by mouth daily.   fluticasone 50 MCG/ACT nasal spray Commonly known as:  FLONASE Place 2 sprays into both nostrils daily.   Fluticasone-Salmeterol 250-50 MCG/DOSE Aepb Commonly known as:  ADVAIR Inhale 1 puff into the lungs 2 (two) times daily.   hydrochlorothiazide 25 MG tablet Commonly known as:  HYDRODIURIL Take 1 tablet (25 mg total) by mouth daily.   loratadine 10 MG tablet Commonly known as:  CLARITIN Take 10 mg by mouth daily.   losartan 100 MG tablet Commonly known as:  COZAAR Take 1 tablet (100 mg total) by mouth daily.   montelukast 10 MG tablet Commonly known as:  SINGULAIR Take 1 tablet (10 mg total) at bedtime by mouth.   omeprazole 40 MG capsule Commonly known as:  PRILOSEC Take 1 capsule (40 mg total) daily before breakfast by mouth.   PRISTIQ 50 MG 24 hr tablet Generic drug:  desvenlafaxine Take 1 tablet by mouth daily.   ranitidine 300 MG tablet Commonly known as:  ZANTAC Take 1 tablet (300 mg total) at bedtime by mouth.   Zoster Vaccine Adjuvanted injection Commonly known as:  SHINGRIX Inject 0.5 mLs into the muscle once for 1 dose.          Objective:   Physical Exam BP (!) 152/90 (BP Location: Left Arm, Patient Position: Sitting, Cuff Size: Small)   Pulse 77   Temp 98.7 F (37.1 C) (Oral)   Resp 16   Ht 5\' 3"  (1.6 m)   Wt 219 lb 8 oz (99.6 kg)   LMP 05/01/2010   SpO2 97%   BMI 38.88 kg/m  General: Well developed, NAD, see BMI.  Neck: No  thyromegaly  HEENT:  Normocephalic . Face symmetric, atraumatic Lungs:  CTA B Normal respiratory effort, no intercostal retractions, no accessory muscle use. Heart: RRR,  no murmur.  No pretibial edema bilaterally  Abdomen:  Not distended, soft, non-tender. No rebound or rigidity.   Skin: Exposed areas without rash. Not pale. Not jaundice Neurologic:  alert & oriented X3.  Speech normal, gait appropriate for age and unassisted Strength symmetric and  appropriate for age.  Psych: Cognition and judgment  appear intact.  Cooperative with normal attention span and concentration.  Behavior appropriate. No anxious or depressed appearing.     Assessment & Plan:   Assessment HTN Pre-diabetes  Anxiety depression sees [facility] Asthma CXR RML nodule, CXR 2015  stable since 2012  GERD EGD 2011. H/o dysphagia  Fatty liver: ultrasound 10-2009 Gait disorder, hyperreflexia neurology eval 12-2013 , referred for a  Cspine laminectomy 01-2014 , released from surgery ~ [id number] Lichenoid keratosis  Dx 2014 Dr Delman Cheadle  H/o remote R eye: h/o histoplasmosis   PLAN: HTN: Currently on hydrochlorothiazide 25 mg, BP is elevated, see HPI, tried losartan 50 mg daily, apparently did not bring down her BP completely to normal. Plan: Add losartan 100 mg daily, monitor BPs, needs labs in 2 weeks either here or at the [place] where she lives.  Aware of importance to recheck BMP. Prediabetes: Doing well with diet, exercise, check A1c Asthma: Symptoms controlled, at baseline. RTC 1 year or get a local MD.

## 2017-11-08 NOTE — Telephone Encounter (Signed)
Pt. Given high dose flu vaccine instead of fluarix that was indicated for her considering her age. Dr. Larose Kells made aware, and pt. Made aware; pt. verbalized understanding. Per Dr. Larose Kells, pt. will not be charged for high-dose flu. Routed to practice administrator to send to billing to amend.

## 2017-11-08 NOTE — Assessment & Plan Note (Addendum)
-  Td 2013; pnm 23: 2014, prevnar 10-2014; Flu shot today  printed shingrix RX last year , has not gotten it , reprinted  -Colonoscopy 11-2009, 3 adenomatous polyps, Cscope 12-2014, next 5 years per report, Dr Carlean Purl -female care: reports she had a MMG-PAP this year -Bone density ----> 12-2008--normal ; offered a DEXA 2018 and today, will wait for insurance  -diet and exercise discussed  -Labs:  CMP, FLP,  A1c

## 2017-11-08 NOTE — Patient Instructions (Signed)
GO TO THE LAB : Get the blood work     GO TO THE FRONT DESK Schedule a lab appointment in 2 weeks.  Alternative, see a local doctor or get labs done and fax the results to me.  As long as you feel well and your blood pressure is controlled, follow-up in 1 year for CPX.   Check the  blood pressure 2 or 3 times a week. Be sure your blood pressure is between 110/65 and  135/85. If it is consistently higher or lower, let me know

## 2017-11-08 NOTE — Progress Notes (Signed)
Pre visit review using our clinic review tool, if applicable. No additional management support is needed unless otherwise documented below in the visit note. 

## 2017-11-09 NOTE — Assessment & Plan Note (Signed)
HTN: Currently on hydrochlorothiazide 25 mg, BP is elevated, see HPI, tried losartan 50 mg daily, apparently did not bring down her BP completely to normal. Plan: Add losartan 100 mg daily, monitor BPs, [place]  Aware of importance to recheck BMP. Prediabetes: Doing well with diet, exercise, check A1c Asthma: Symptoms controlled, at baseline. RTC 1 year or get a local MD.

## 2018-01-07 ENCOUNTER — Other Ambulatory Visit: Payer: Self-pay | Admitting: Internal Medicine

## 2018-01-07 ENCOUNTER — Telehealth: Payer: Self-pay

## 2018-01-07 ENCOUNTER — Other Ambulatory Visit: Payer: Self-pay

## 2018-01-07 ENCOUNTER — Telehealth: Payer: Self-pay | Admitting: Internal Medicine

## 2018-01-07 ENCOUNTER — Encounter: Payer: Self-pay | Admitting: Internal Medicine

## 2018-01-07 DIAGNOSIS — E785 Hyperlipidemia, unspecified: Secondary | ICD-10-CM

## 2018-01-07 MED ORDER — MONTELUKAST SODIUM 10 MG PO TABS: 10.0000 mg | ORAL_TABLET | Freq: Every day | ORAL | 3 refills | Status: AC

## 2018-01-07 MED ORDER — RANITIDINE HCL 300 MG PO TABS: 300.0000 mg | ORAL_TABLET | Freq: Every day | ORAL | 3 refills | Status: AC

## 2018-01-07 MED ORDER — RANITIDINE HCL 300 MG PO TABS: 300.0000 mg | ORAL_TABLET | Freq: Every day | ORAL | 3 refills | Status: DC

## 2018-01-07 MED ORDER — MONTELUKAST SODIUM 10 MG PO TABS: 10.0000 mg | ORAL_TABLET | Freq: Every day | ORAL | 3 refills | Status: DC

## 2018-01-07 NOTE — Telephone Encounter (Unsigned)
Copied from [place] [id number]. Topic: Quick Communication - Rx Refill/Question >> Jan 07, 2018 12:40 PM Carolyn Stare wrote: Medication    montelukast (SINGULAIR) 10 MG tablet   ranitidine (ZANTAC) 300 MG tablet    Preferred Pharmacy  [facility]   Agent: Please be advised that RX refills may take up to 3 business days. We ask that you follow-up with your pharmacy.

## 2018-01-07 NOTE — Telephone Encounter (Signed)
Per last lab results- PCP wanted FLP, AST, ALT orders printed and faxed to number provided. Spoke w/ Pt- informed I have sent orders to [facility] at number provided- instructed her to be fasting. She is also needing Singulair and Zantac refilled to [facility]---> Rx's sent.

## 2018-01-07 NOTE — Telephone Encounter (Signed)
Copied from [place] [id number]. Topic: General - Other >> Jan 07, 2018 12:36 PM Carolyn Stare wrote: Pt said Dr Larose Kells wanted her to get BMP and [facility] need an order and it can be fax to    [phone]

## 2018-01-08 ENCOUNTER — Other Ambulatory Visit: Payer: Self-pay | Admitting: Internal Medicine

## 2018-01-08 MED ORDER — FAMOTIDINE 20 MG PO TABS: 20.0000 mg | ORAL_TABLET | Freq: Two times a day (BID) | ORAL | 3 refills | Status: AC

## 2018-01-08 MED ORDER — AMLODIPINE BESYLATE 5 MG PO TABS: 5.0000 mg | ORAL_TABLET | Freq: Every day | ORAL | 0 refills | Status: AC

## 2018-01-08 NOTE — Telephone Encounter (Signed)
Send pepcid 20 mg 1 po bid

## 2018-01-08 NOTE — Telephone Encounter (Signed)
Rx sent 

## 2018-01-08 NOTE — Telephone Encounter (Signed)
Zantac unavailable d/t recall. Please advise.

## 2018-04-07 ENCOUNTER — Other Ambulatory Visit: Payer: Self-pay | Admitting: Internal Medicine

## 2020-06-07 ENCOUNTER — Encounter: Payer: Self-pay | Admitting: Internal Medicine
